# Patient Record
Sex: Female | Born: 1950 | Race: White | Hispanic: No | Marital: Married | Smoking: Never smoker
Health system: Southern US, Community
[De-identification: ages and names within clinical notes are randomized; demographics above are authoritative.]

## PROBLEM LIST (undated history)

## (undated) DIAGNOSIS — K219 Gastro-esophageal reflux disease without esophagitis: Secondary | ICD-10-CM

## (undated) DIAGNOSIS — I341 Nonrheumatic mitral (valve) prolapse: Secondary | ICD-10-CM

## (undated) DIAGNOSIS — Z8619 Personal history of other infectious and parasitic diseases: Secondary | ICD-10-CM

## (undated) DIAGNOSIS — Z87442 Personal history of urinary calculi: Secondary | ICD-10-CM

## (undated) DIAGNOSIS — E669 Obesity, unspecified: Secondary | ICD-10-CM

## (undated) DIAGNOSIS — Z86010 Personal history of colonic polyps: Secondary | ICD-10-CM

## (undated) DIAGNOSIS — G2581 Restless legs syndrome: Secondary | ICD-10-CM

## (undated) DIAGNOSIS — I10 Essential (primary) hypertension: Secondary | ICD-10-CM

## (undated) DIAGNOSIS — D709 Neutropenia, unspecified: Secondary | ICD-10-CM

## (undated) DIAGNOSIS — N6099 Unspecified benign mammary dysplasia of unspecified breast: Secondary | ICD-10-CM

## (undated) DIAGNOSIS — N289 Disorder of kidney and ureter, unspecified: Secondary | ICD-10-CM

## (undated) DIAGNOSIS — D4860 Neoplasm of uncertain behavior of unspecified breast: Secondary | ICD-10-CM

## (undated) DIAGNOSIS — N189 Chronic kidney disease, unspecified: Secondary | ICD-10-CM

## (undated) DIAGNOSIS — O00109 Unspecified tubal pregnancy without intrauterine pregnancy: Secondary | ICD-10-CM

## (undated) DIAGNOSIS — C801 Malignant (primary) neoplasm, unspecified: Secondary | ICD-10-CM

## (undated) DIAGNOSIS — R920 Mammographic microcalcification found on diagnostic imaging of breast: Secondary | ICD-10-CM

## (undated) DIAGNOSIS — M1990 Unspecified osteoarthritis, unspecified site: Secondary | ICD-10-CM

## (undated) DIAGNOSIS — C439 Malignant melanoma of skin, unspecified: Secondary | ICD-10-CM

## (undated) DIAGNOSIS — Z1211 Encounter for screening for malignant neoplasm of colon: Secondary | ICD-10-CM

## (undated) HISTORY — DX: Obesity, unspecified: E66.9

## (undated) HISTORY — DX: Mammographic microcalcification found on diagnostic imaging of breast: R92.0

## (undated) HISTORY — DX: Unspecified benign mammary dysplasia of unspecified breast: N60.99

## (undated) HISTORY — DX: Unspecified tubal pregnancy without intrauterine pregnancy: O00.109

## (undated) HISTORY — DX: Neoplasm of uncertain behavior of unspecified breast: D48.60

## (undated) HISTORY — PX: BREAST SURGERY: SHX581

## (undated) HISTORY — PX: COLONOSCOPY: SHX174

## (undated) HISTORY — DX: Gastro-esophageal reflux disease without esophagitis: K21.9

## (undated) HISTORY — DX: Nonrheumatic mitral (valve) prolapse: I34.1

## (undated) HISTORY — DX: Unspecified osteoarthritis, unspecified site: M19.90

## (undated) HISTORY — DX: Personal history of colonic polyps: Z86.010

## (undated) HISTORY — DX: Malignant (primary) neoplasm, unspecified: C80.1

## (undated) HISTORY — DX: Essential (primary) hypertension: I10

## (undated) HISTORY — DX: Malignant melanoma of skin, unspecified: C43.9

## (undated) HISTORY — DX: Encounter for screening for malignant neoplasm of colon: Z12.11

---

## 1979-06-11 HISTORY — PX: ECTOPIC PREGNANCY SURGERY: SHX613

## 1979-06-11 HISTORY — PX: TUBAL LIGATION: SHX77

## 2003-06-11 DIAGNOSIS — C801 Malignant (primary) neoplasm, unspecified: Secondary | ICD-10-CM

## 2003-06-11 HISTORY — DX: Malignant (primary) neoplasm, unspecified: C80.1

## 2004-06-10 DIAGNOSIS — I341 Nonrheumatic mitral (valve) prolapse: Secondary | ICD-10-CM

## 2004-06-10 HISTORY — DX: Nonrheumatic mitral (valve) prolapse: I34.1

## 2004-08-09 ENCOUNTER — Ambulatory Visit: Payer: Self-pay | Admitting: General Surgery

## 2004-08-30 ENCOUNTER — Ambulatory Visit: Payer: Self-pay | Admitting: Unknown Physician Specialty

## 2005-06-10 HISTORY — PX: SHOULDER SURGERY: SHX246

## 2005-07-12 ENCOUNTER — Ambulatory Visit: Payer: Self-pay | Admitting: Urology

## 2005-12-23 ENCOUNTER — Ambulatory Visit: Payer: Self-pay | Admitting: Physician Assistant

## 2005-12-27 ENCOUNTER — Encounter: Payer: Self-pay | Admitting: Physician Assistant

## 2006-01-08 ENCOUNTER — Encounter: Payer: Self-pay | Admitting: Physician Assistant

## 2006-01-16 ENCOUNTER — Ambulatory Visit: Payer: Self-pay | Admitting: Unknown Physician Specialty

## 2006-01-23 ENCOUNTER — Ambulatory Visit: Payer: Self-pay | Admitting: Orthopaedic Surgery

## 2006-02-08 ENCOUNTER — Encounter: Payer: Self-pay | Admitting: Physician Assistant

## 2006-02-19 ENCOUNTER — Ambulatory Visit: Payer: Self-pay

## 2006-03-10 ENCOUNTER — Encounter: Payer: Self-pay | Admitting: Physician Assistant

## 2006-04-10 ENCOUNTER — Encounter: Payer: Self-pay | Admitting: Physician Assistant

## 2006-05-08 ENCOUNTER — Ambulatory Visit: Payer: Self-pay | Admitting: Orthopaedic Surgery

## 2006-05-10 ENCOUNTER — Encounter: Payer: Self-pay | Admitting: Physician Assistant

## 2006-07-16 ENCOUNTER — Ambulatory Visit: Payer: Self-pay | Admitting: Urology

## 2007-03-26 ENCOUNTER — Ambulatory Visit: Payer: Self-pay | Admitting: Unknown Physician Specialty

## 2007-06-11 DIAGNOSIS — M1990 Unspecified osteoarthritis, unspecified site: Secondary | ICD-10-CM

## 2007-06-11 HISTORY — PX: OTHER SURGICAL HISTORY: SHX169

## 2007-06-11 HISTORY — DX: Unspecified osteoarthritis, unspecified site: M19.90

## 2007-07-15 ENCOUNTER — Ambulatory Visit: Payer: Self-pay | Admitting: Urology

## 2007-09-07 ENCOUNTER — Ambulatory Visit: Payer: Self-pay | Admitting: General Surgery

## 2008-06-10 DIAGNOSIS — I10 Essential (primary) hypertension: Secondary | ICD-10-CM

## 2008-06-10 HISTORY — DX: Essential (primary) hypertension: I10

## 2008-07-13 ENCOUNTER — Ambulatory Visit: Payer: Self-pay | Admitting: Urology

## 2008-07-14 ENCOUNTER — Ambulatory Visit: Payer: Self-pay | Admitting: Urology

## 2008-07-19 ENCOUNTER — Ambulatory Visit: Payer: Self-pay | Admitting: Unknown Physician Specialty

## 2009-07-26 ENCOUNTER — Ambulatory Visit: Payer: Self-pay | Admitting: Urology

## 2009-09-01 ENCOUNTER — Emergency Department: Payer: Self-pay | Admitting: Internal Medicine

## 2010-12-23 ENCOUNTER — Emergency Department: Payer: Self-pay | Admitting: Emergency Medicine

## 2011-06-11 DIAGNOSIS — Z860100 Personal history of colon polyps, unspecified: Secondary | ICD-10-CM

## 2011-06-11 DIAGNOSIS — R920 Mammographic microcalcification found on diagnostic imaging of breast: Secondary | ICD-10-CM

## 2011-06-11 HISTORY — DX: Personal history of colon polyps, unspecified: Z86.0100

## 2011-06-11 HISTORY — DX: Personal history of colonic polyps: Z86.010

## 2011-06-11 HISTORY — PX: BREAST BIOPSY: SHX20

## 2011-06-11 HISTORY — PX: BREAST SURGERY: SHX581

## 2011-06-11 HISTORY — DX: Mammographic microcalcification found on diagnostic imaging of breast: R92.0

## 2011-10-09 DIAGNOSIS — N6099 Unspecified benign mammary dysplasia of unspecified breast: Secondary | ICD-10-CM

## 2011-10-09 HISTORY — DX: Unspecified benign mammary dysplasia of unspecified breast: N60.99

## 2011-10-23 ENCOUNTER — Ambulatory Visit: Payer: Self-pay | Admitting: Unknown Physician Specialty

## 2011-11-07 ENCOUNTER — Ambulatory Visit: Payer: Self-pay | Admitting: General Surgery

## 2011-11-19 ENCOUNTER — Ambulatory Visit: Payer: Self-pay | Admitting: Anesthesiology

## 2011-11-19 LAB — POTASSIUM: Potassium: 3.7 mmol/L (ref 3.5–5.1)

## 2011-11-21 ENCOUNTER — Ambulatory Visit: Payer: Self-pay | Admitting: General Surgery

## 2011-11-22 LAB — PATHOLOGY REPORT

## 2012-04-23 ENCOUNTER — Ambulatory Visit: Payer: Self-pay | Admitting: General Surgery

## 2012-06-10 DIAGNOSIS — D4860 Neoplasm of uncertain behavior of unspecified breast: Secondary | ICD-10-CM

## 2012-06-10 DIAGNOSIS — E669 Obesity, unspecified: Secondary | ICD-10-CM

## 2012-06-10 DIAGNOSIS — Z1211 Encounter for screening for malignant neoplasm of colon: Secondary | ICD-10-CM

## 2012-06-10 HISTORY — DX: Neoplasm of uncertain behavior of unspecified breast: D48.60

## 2012-06-10 HISTORY — DX: Obesity, unspecified: E66.9

## 2012-06-10 HISTORY — PX: COLONOSCOPY W/ POLYPECTOMY: SHX1380

## 2012-06-10 HISTORY — DX: Encounter for screening for malignant neoplasm of colon: Z12.11

## 2012-08-31 ENCOUNTER — Encounter: Payer: Self-pay | Admitting: *Deleted

## 2012-08-31 DIAGNOSIS — C801 Malignant (primary) neoplasm, unspecified: Secondary | ICD-10-CM | POA: Insufficient documentation

## 2012-08-31 DIAGNOSIS — R920 Mammographic microcalcification found on diagnostic imaging of breast: Secondary | ICD-10-CM | POA: Insufficient documentation

## 2012-08-31 DIAGNOSIS — N6099 Unspecified benign mammary dysplasia of unspecified breast: Secondary | ICD-10-CM | POA: Insufficient documentation

## 2012-08-31 DIAGNOSIS — D4860 Neoplasm of uncertain behavior of unspecified breast: Secondary | ICD-10-CM | POA: Insufficient documentation

## 2012-09-03 ENCOUNTER — Encounter: Payer: Self-pay | Admitting: General Surgery

## 2012-09-28 ENCOUNTER — Telehealth: Payer: Self-pay | Admitting: *Deleted

## 2012-09-28 ENCOUNTER — Encounter: Payer: Self-pay | Admitting: *Deleted

## 2012-09-28 NOTE — Telephone Encounter (Signed)
Patient called to reschedule colonoscopy that was scheduled for 09-29-12. She reports she has a fever and a cough. Colonoscopy has been rescheduled to 11-18-12 at [facility]. This patient also reports she is now taking diclofenac 75 mg one by mouth BID. This has been added to medication list.

## 2012-10-22 ENCOUNTER — Encounter: Payer: Self-pay | Admitting: General Surgery

## 2012-10-22 ENCOUNTER — Ambulatory Visit: Payer: Self-pay | Admitting: General Surgery

## 2012-11-03 ENCOUNTER — Ambulatory Visit: Payer: 59 | Admitting: General Surgery

## 2012-11-06 ENCOUNTER — Telehealth: Payer: Self-pay | Admitting: *Deleted

## 2012-11-06 NOTE — Telephone Encounter (Signed)
Left message for patient to call the office on home and cell numbers.  We need to verify no medication changes since last office visit. She will need to be reminded to pre-register if she has not done so already. Patient is scheduled for a colonoscopy on 11-18-12 at [facility].

## 2012-11-06 NOTE — Telephone Encounter (Signed)
Patient states she is now on Klor-con. This will be added to medication list. She also reports that she has pre-registered. We will proceed with colonoscopy that is scheduled for 11-18-12 at [facility]. She will call if she has further questions.

## 2012-11-18 ENCOUNTER — Ambulatory Visit: Payer: Self-pay | Admitting: General Surgery

## 2012-11-18 DIAGNOSIS — D128 Benign neoplasm of rectum: Secondary | ICD-10-CM

## 2012-11-18 DIAGNOSIS — D129 Benign neoplasm of anus and anal canal: Secondary | ICD-10-CM

## 2012-11-19 ENCOUNTER — Encounter: Payer: Self-pay | Admitting: General Surgery

## 2012-11-20 LAB — PATHOLOGY REPORT

## 2012-11-23 ENCOUNTER — Encounter: Payer: Self-pay | Admitting: General Surgery

## 2012-11-24 ENCOUNTER — Telehealth: Payer: Self-pay | Admitting: *Deleted

## 2012-11-24 NOTE — Telephone Encounter (Signed)
Notified patient as instructed, patient pleased. Discussed follow-up appointments, for 8 year recall, patient agrees

## 2012-11-24 NOTE — Telephone Encounter (Signed)
Message copied by Currie Paris on Tue Nov 24, 2012 10:38 AM ------      Message from: Earline Mayotte      Created: Tue Nov 24, 2012  9:32 AM       Please notify the patient that the polyp removed was entirely benign. Repeat exam in 8 years unless she is having problems.  ------

## 2012-12-15 ENCOUNTER — Ambulatory Visit (INDEPENDENT_AMBULATORY_CARE_PROVIDER_SITE_OTHER): Payer: 59 | Admitting: General Surgery

## 2012-12-15 ENCOUNTER — Encounter: Payer: Self-pay | Admitting: General Surgery

## 2012-12-15 VITALS — BP 134/76 | HR 76 | Resp 14 | Ht 64.0 in | Wt 188.0 lb

## 2012-12-15 DIAGNOSIS — D4860 Neoplasm of uncertain behavior of unspecified breast: Secondary | ICD-10-CM

## 2012-12-15 NOTE — Progress Notes (Signed)
Patient ID: Melissa Rhodes, female   DOB: 08/13/1950, 62 y.o.   MRN: [id number]  Chief Complaint  Patient presents with  . Follow-up    mammogram    HPI Melissa Rhodes is a 62 y.o. female here today for an follow up mammogram done on 10/22/12 cat 1. Patient does peform self breast checks and gets regular mammograms.  HPI  Past Medical History  Diagnosis Date  . Arthritis 2009  . Hypertension 2010  . GERD (gastroesophageal reflux disease)   . Personal history of colonic polyps 2013  . Cancer 2005    colon  . 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 Mammographic microcalcification 2013  . Special screening for malignant neoplasms, colon 2014  . Obesity, unspecified 2014  . Mitral valve prolapse 2006  . 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 Tubal pregnancy     Past Surgical History  Procedure Laterality Date  . Shoulder surgery Right 2007  . Colonoscopy  4098,1191  . Colonoscopy w/ polypectomy  2014  . Ectopic pregnancy surgery  1981  . Tubal ligation  1981  . Breast surgery Left 2013    re excision of leftbreast showed small area of residual ADH, but she was not upsatged to DCIS or invasive cancer. The margins were clear  . Kidney stones  2009    Family History  Problem Relation Age of Onset  . Cancer Father 61    lung    Social History History  Substance Use Topics  . Smoking status: Never Smoker   . Smokeless tobacco: Never Used  . Alcohol Use: No    No Known Allergies  Current Outpatient Prescriptions  Medication Sig Dispense Refill  . diclofenac (VOLTAREN) 75 MG EC tablet Take 75 mg by mouth 2 (two) times daily.      . hydrochlorothiazide (HYDRODIURIL) 25 MG tablet Take 25 mg by mouth daily.      . potassium chloride (K-DUR,KLOR-CON) 10 MEQ tablet Take 10 mEq by mouth daily.      Marland Kitchen rOPINIRole (REQUIP) 1 MG tablet Take 1 mg by mouth daily.      . clonazePAM (KLONOPIN) 0.5 MG tablet Take 0.5 mg by mouth daily.        No current facility-administered medications for this visit.    Review of Systems Review of Systems  Constitutional: Negative.   Respiratory: Negative.   Cardiovascular: Negative.     Blood pressure 134/76, pulse 76, resp. rate 14, height 5\' 4"  (1.626 m), weight 188 lb (85.276 kg).  Physical Exam Physical Exam  Constitutional: She is oriented to person, place, and time. She appears well-developed.  Cardiovascular: Normal rate, regular rhythm and normal heart sounds.   Pulmonary/Chest: Breath sounds normal. Right breast exhibits no inverted nipple, no mass, no nipple discharge, no skin change and no tenderness. Left breast exhibits no inverted nipple, no mass, no nipple discharge, no skin change and no tenderness.  Left breast well healed incision at 12o'clock  Lymphadenopathy:    She has no cervical adenopathy.    She has no axillary adenopathy.  Neurological: She is alert and oriented to person, place, and time.  Skin: Skin is warm.    Data Reviewed Polyps removed in June 2014 from the sigmoid colon was hyperplastic. 2009 exam showed no polyps. 2006 exam did show an adenomatous lesion. An 8 year followup have been recommended but the patient reports she is exceptionally anxious and requests a  5 year followup.  Bilateral mammograms in Oct 22, 2012 were reviewed. BI-RAD-1.   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  Past history colonic polyps.     Plan    Arrangements were made for a followup examination in one year with bilateral mammograms at that time. A follow up colonoscopy will be scheduled for 2019 per patient request.        Earline Mayotte 12/15/2012, 8:33 PM

## 2012-12-15 NOTE — Patient Instructions (Signed)
Patient to return in one year. 

## 2012-12-16 ENCOUNTER — Other Ambulatory Visit: Payer: Self-pay | Admitting: *Deleted

## 2012-12-16 NOTE — Progress Notes (Signed)
Please change date for her follow up colonoscopy to 2019. Thank you  This has been changed in recalls accordingly per Dr. Lemar Livings.

## 2013-06-22 ENCOUNTER — Ambulatory Visit: Payer: Self-pay | Admitting: Urology

## 2013-12-15 ENCOUNTER — Encounter: Payer: Self-pay | Admitting: General Surgery

## 2013-12-20 ENCOUNTER — Encounter: Payer: Self-pay | Admitting: General Surgery

## 2013-12-20 ENCOUNTER — Ambulatory Visit (INDEPENDENT_AMBULATORY_CARE_PROVIDER_SITE_OTHER): Payer: 59 | Admitting: General Surgery

## 2013-12-20 VITALS — BP 134/76 | HR 72 | Resp 12 | Ht 64.0 in | Wt 189.0 lb

## 2013-12-20 DIAGNOSIS — D4860 Neoplasm of uncertain behavior of unspecified breast: Secondary | ICD-10-CM

## 2013-12-20 NOTE — Progress Notes (Signed)
Patient ID: Melissa Rhodes, female   DOB: [birth year], 63 y.o.   MRN: [id number]  Chief Complaint  Patient presents with  . Follow-up    mammogram    HPI Melissa Rhodes is a 63 y.o. female who presents for a breast evaluation. The most recent mammogram was done on 12/15/13 at [facility] .  Patient does perform regular self breast checks and gets regular mammograms done.    HPI  Past Medical History  Diagnosis Date  . Arthritis 2009  . Hypertension 2010  . GERD (gastroesophageal reflux disease)   . Personal history of colonic polyps 2013  . Cancer 2005    colon  . 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 Mammographic microcalcification 2013  . Special screening for malignant neoplasms, colon 2014  . Obesity, unspecified 2014  . Mitral valve prolapse 2006  . 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 Tubal pregnancy     Past Surgical History  Procedure Laterality Date  . Shoulder surgery Right 2007  . Colonoscopy  [id number]  . Colonoscopy w/ polypectomy  2014  . Ectopic pregnancy surgery  1981  . Tubal ligation  1981  . Kidney stones  2009  . Breast surgery Left 2013    re excision of left breast showed small area of residual ADH, but she was not upsatged to DCIS or invasive cancer. The margins were clear.  Patient declined tamoxifen therapy    Family History  Problem Relation Age of Onset  . Cancer Father 22    lung    Social History History  Substance Use Topics  . Smoking status: Never Smoker   . Smokeless tobacco: Never Used  . Alcohol Use: No    No Known Allergies  Current Outpatient Prescriptions  Medication Sig Dispense Refill  . clonazePAM (KLONOPIN) 0.5 MG tablet Take 0.5 mg by mouth daily.      . diclofenac (VOLTAREN) 75 MG EC tablet Take 75 mg by mouth 2 (two) times daily.      . hydrochlorothiazide (HYDRODIURIL) 25 MG tablet Take 25 mg by mouth daily.      . potassium chloride (K-DUR,KLOR-CON) 10 MEQ tablet Take 10 mEq by  mouth daily.      Marland Kitchen rOPINIRole (REQUIP) 1 MG tablet Take 1 mg by mouth daily.       No current facility-administered medications for this visit.    Review of Systems Review of Systems  Constitutional: Negative.   Respiratory: Negative.   Cardiovascular: Negative.     Blood pressure 134/76, pulse 72, resp. rate 12, height 5\' 4"  (1.626 m), weight 189 lb (85.73 kg).  Physical Exam Physical Exam  Constitutional: She is oriented to person, place, and time. She appears well-developed and well-nourished.  Eyes: Conjunctivae are normal. No scleral icterus.  Neck: Neck supple.  Cardiovascular: Normal rate, regular rhythm and normal heart sounds.   Pulmonary/Chest: Effort normal and breath sounds normal. Right breast exhibits no inverted nipple, no mass, no nipple discharge, no skin change and no tenderness. Left breast exhibits no inverted nipple, no mass, no nipple discharge, no skin change and no tenderness.  Well healed incison 10 -2 o'clock left breast.   Abdominal: Soft. Bowel sounds are normal. There is no tenderness.  Lymphadenopathy:    She has no cervical adenopathy.    She has no axillary adenopathy.  Neurological: She is alert and oriented to person, place, and time.  Skin: Skin is warm  and dry.    Data Reviewed Bilateral mammograms dated 12/15/2013 were reviewed. Notable change. BI-RAD-2. These films were independently reviewed.  Assessment    Stable breast exam.  Previous identification of ADH. 1.5% absolute benefit with the use of antiestrogen therapy declined by the patient    Plan    Will plan for a follow up exam and bilateral mammograms in one year.      PCP: Sarina Ser, Jeanett Schlein 12/21/2013, 3:45 PM

## 2013-12-20 NOTE — Patient Instructions (Signed)
Patient to return on one year bilateral diagnotic mammogram.

## 2013-12-21 ENCOUNTER — Encounter: Payer: Self-pay | Admitting: General Surgery

## 2013-12-22 ENCOUNTER — Ambulatory Visit: Payer: 59 | Admitting: General Surgery

## 2014-04-11 ENCOUNTER — Encounter: Payer: Self-pay | Admitting: General Surgery

## 2014-07-27 DIAGNOSIS — G2581 Restless legs syndrome: Secondary | ICD-10-CM | POA: Insufficient documentation

## 2014-10-02 NOTE — Op Note (Signed)
PATIENT NAME:  Melissa Rhodes, OBYRNE MR#:  [id number] DATE OF BIRTH:  12/04/1950  DATE OF PROCEDURE:  11/21/2011  PREOPERATIVE DIAGNOSIS: Atypical ductal hyperplasia of the left breast.   POSTOPERATIVE DIAGNOSIS: Atypical ductal hyperplasia of the left breast.  OPERATIVE PROCEDURE: Wide local excision with ultrasound guidance.   SURGEON: Robert Bellow, MD  ANESTHESIA: General by mask under Dr. Marcello Moores, 0.5% Xylocaine with 0.25% Marcaine, plain, 40 mL local infiltration.   ESTIMATED BLOOD LOSS: Minimal.   CLINICAL NOTE: This 64 year old woman had a stereotactic biopsy for microcalcifications showing evidence of atypical ductal hyperplasia. She was encouraged to have re-excision to confirm that no additional pathology was evident.   OPERATIVE NOTE: With the patient under adequate general anesthesia, the area was prepped with ChloraPrep and draped. The above-mentioned local anesthetic was used for postoperative analgesia. Ultrasound was used to confirm the previous biopsy cavity in the 12:00 position of the left breast about 2 cm from the nipple. A skin incision was made transversely orientated and carried down through the skin and subcutaneous tissue. At the junction of the adipose layer and the breast parenchyma a 4 x 6 x 4 cm block of tissue was removed and orientated for the pathologist. Specimen radiograph was obtained. The deep tissue was approximated with interrupted 2-0 Vicryl figure-of-eight sutures. The adipose layer was approximated in a similar fashion. The skin was closed with a running 4-0 Vicryl subcuticular suture. Benzoin, Steri-Strips, Telfa, and Tegaderm dressing was then applied.        Patient tolerated the procedure well was taken to the recovery room in stable condition.    ____________________________ Robert Bellow, MD jwb:cms D: 11/21/2011 20:31:49 ET T: 11/22/2011 08:42:35 ET JOB#: [id number]  cc: Robert Bellow, MD, <Dictator> John B. Sarina Ser,  MD Eusebio Me, MD [place] Amedeo Kinsman MD ELECTRONICALLY SIGNED 11/23/2011 1:53

## 2014-12-29 ENCOUNTER — Encounter: Payer: Self-pay | Admitting: General Surgery

## 2015-01-02 ENCOUNTER — Ambulatory Visit (INDEPENDENT_AMBULATORY_CARE_PROVIDER_SITE_OTHER): Payer: 59 | Admitting: General Surgery

## 2015-01-02 ENCOUNTER — Encounter: Payer: Self-pay | Admitting: General Surgery

## 2015-01-02 VITALS — BP 130/78 | HR 76 | Resp 12 | Ht 64.0 in | Wt 186.0 lb

## 2015-01-02 DIAGNOSIS — N6099 Unspecified benign mammary dysplasia of unspecified breast: Secondary | ICD-10-CM

## 2015-01-02 DIAGNOSIS — N62 Hypertrophy of breast: Secondary | ICD-10-CM | POA: Diagnosis not present

## 2015-01-02 NOTE — Patient Instructions (Signed)
Bilateral Screening Mammogram one year

## 2015-01-02 NOTE — Progress Notes (Signed)
Patient ID: Melissa Rhodes, female   DOB: December 31, 1950, 64 y.o.   MRN: [id number]  Chief Complaint  Patient presents with  . Other    mammogram    HPI Melissa Rhodes is a 64 y.o. female who presents for a breast evaluation. The most recent mammogram was done on 12/27/14, Cat 1. Patient does perform regular self breast checks and gets regular mammograms done.     HPI  Past Medical History  Diagnosis Date  . Arthritis 2009  . Hypertension 2010  . GERD (gastroesophageal reflux disease)   . Personal history of colonic polyps 2013  . Cancer 2005    colon  . 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 Mammographic microcalcification 2013  . Special screening for malignant neoplasms, colon 2014  . Obesity, unspecified 2014  . Mitral valve prolapse 2006  . 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 Tubal pregnancy     Past Surgical History  Procedure Laterality Date  . Shoulder surgery Right 2007  . Colonoscopy  [id number]  . Colonoscopy w/ polypectomy  2014  . Ectopic pregnancy surgery  1981  . Tubal ligation  1981  . Kidney stones  2009  . Breast surgery Left 2013    re excision of left breast showed small area of residual ADH, but she was not upsatged to DCIS or invasive cancer. The margins were clear.  Patient declined tamoxifen therapy    Family History  Problem Relation Age of Onset  . Cancer Father 56    lung    Social History History  Substance Use Topics  . Smoking status: Never Smoker   . Smokeless tobacco: Never Used  . Alcohol Use: No    No Known Allergies  Current Outpatient Prescriptions  Medication Sig Dispense Refill  . clonazePAM (KLONOPIN) 0.5 MG tablet Take 0.5 mg by mouth daily.    . diclofenac (VOLTAREN) 75 MG EC tablet Take 75 mg by mouth 2 (two) times daily.    . hydrochlorothiazide (HYDRODIURIL) 25 MG tablet Take 25 mg by mouth daily.     No current facility-administered medications for this visit.    Review  of Systems Review of Systems  Constitutional: Negative.   Respiratory: Negative.   Cardiovascular: Negative.     Blood pressure 130/78, pulse 76, resp. rate 12, height 5\' 4"  (1.626 m), weight 186 lb (84.369 kg).  Physical Exam Physical Exam  Constitutional: She is oriented to person, place, and time. She appears well-developed and well-nourished.  Eyes: Conjunctivae are normal. No scleral icterus.  Neck: Neck supple.  Cardiovascular: Normal rate, regular rhythm and normal heart sounds.   Pulmonary/Chest: Effort normal and breath sounds normal. Right breast exhibits no inverted nipple, no mass, no nipple discharge, no skin change and no tenderness. Left breast exhibits no inverted nipple, no mass, no nipple discharge, no skin change and no tenderness.    Lymphadenopathy:    She has no cervical adenopathy.  Neurological: She is alert and oriented to person, place, and time.  Skin: Skin is warm and dry.  Psychiatric: She has a normal mood and affect.    Data Reviewed Bilateral diagnostic mammograms dated 12/27/2014 were reviewed and compared to previous studies. No interval change. BI-RADS-1.  Assessment    Stable, benign breast exam.    Plan    We will plan for follow-up examination in one year. Once again reviewed role of chemo prophylaxis. Declined.    PCP:  Sarina Ser, Jeanett Schlein 01/03/2015, 5:12 PM

## 2015-01-03 DIAGNOSIS — N6099 Unspecified benign mammary dysplasia of unspecified breast: Secondary | ICD-10-CM | POA: Insufficient documentation

## 2015-07-21 DIAGNOSIS — M5442 Lumbago with sciatica, left side: Secondary | ICD-10-CM | POA: Diagnosis not present

## 2015-07-21 DIAGNOSIS — M7062 Trochanteric bursitis, left hip: Secondary | ICD-10-CM | POA: Diagnosis not present

## 2015-08-29 DIAGNOSIS — H16213 Exposure keratoconjunctivitis, bilateral: Secondary | ICD-10-CM | POA: Diagnosis not present

## 2015-08-29 DIAGNOSIS — H52223 Regular astigmatism, bilateral: Secondary | ICD-10-CM | POA: Diagnosis not present

## 2015-08-29 DIAGNOSIS — H00025 Hordeolum internum left lower eyelid: Secondary | ICD-10-CM | POA: Diagnosis not present

## 2015-08-29 DIAGNOSIS — H00022 Hordeolum internum right lower eyelid: Secondary | ICD-10-CM | POA: Diagnosis not present

## 2015-08-29 DIAGNOSIS — H04123 Dry eye syndrome of bilateral lacrimal glands: Secondary | ICD-10-CM | POA: Diagnosis not present

## 2015-08-29 DIAGNOSIS — H524 Presbyopia: Secondary | ICD-10-CM | POA: Diagnosis not present

## 2015-08-29 DIAGNOSIS — H5202 Hypermetropia, left eye: Secondary | ICD-10-CM | POA: Diagnosis not present

## 2015-09-01 DIAGNOSIS — I10 Essential (primary) hypertension: Secondary | ICD-10-CM | POA: Diagnosis not present

## 2015-09-01 DIAGNOSIS — E7800 Pure hypercholesterolemia, unspecified: Secondary | ICD-10-CM | POA: Diagnosis not present

## 2015-09-08 DIAGNOSIS — Z87442 Personal history of urinary calculi: Secondary | ICD-10-CM | POA: Diagnosis not present

## 2015-09-08 DIAGNOSIS — I10 Essential (primary) hypertension: Secondary | ICD-10-CM | POA: Diagnosis not present

## 2015-09-08 DIAGNOSIS — I341 Nonrheumatic mitral (valve) prolapse: Secondary | ICD-10-CM | POA: Diagnosis not present

## 2015-09-08 DIAGNOSIS — D709 Neutropenia, unspecified: Secondary | ICD-10-CM | POA: Diagnosis not present

## 2015-09-08 DIAGNOSIS — Z23 Encounter for immunization: Secondary | ICD-10-CM | POA: Diagnosis not present

## 2015-09-08 DIAGNOSIS — E7800 Pure hypercholesterolemia, unspecified: Secondary | ICD-10-CM | POA: Diagnosis not present

## 2015-09-08 DIAGNOSIS — Z0001 Encounter for general adult medical examination with abnormal findings: Secondary | ICD-10-CM | POA: Diagnosis not present

## 2015-10-04 ENCOUNTER — Other Ambulatory Visit: Payer: Self-pay

## 2015-10-04 DIAGNOSIS — N6089 Other benign mammary dysplasias of unspecified breast: Secondary | ICD-10-CM

## 2015-10-05 ENCOUNTER — Other Ambulatory Visit: Payer: Self-pay

## 2015-10-05 DIAGNOSIS — Z1231 Encounter for screening mammogram for malignant neoplasm of breast: Secondary | ICD-10-CM

## 2015-10-06 ENCOUNTER — Other Ambulatory Visit: Payer: Self-pay | Admitting: *Deleted

## 2015-10-06 ENCOUNTER — Inpatient Hospital Stay
Admission: RE | Admit: 2015-10-06 | Discharge: 2015-10-06 | Disposition: A | Discharge status: Home / Self Care | Payer: Self-pay | Source: Ambulatory Visit | Attending: *Deleted | Admitting: *Deleted

## 2015-10-06 DIAGNOSIS — Z9289 Personal history of other medical treatment: Secondary | ICD-10-CM

## 2015-12-28 ENCOUNTER — Ambulatory Visit: Payer: Self-pay

## 2016-01-02 ENCOUNTER — Ambulatory Visit: Payer: 59 | Admitting: General Surgery

## 2016-01-03 ENCOUNTER — Ambulatory Visit: Payer: 59 | Admitting: General Surgery

## 2016-02-06 ENCOUNTER — Ambulatory Visit
Admission: RE | Admit: 2016-02-06 | Discharge: 2016-02-06 | Disposition: A | Discharge status: Home / Self Care | Payer: PPO | Source: Ambulatory Visit | Attending: General Surgery | Admitting: General Surgery

## 2016-02-06 ENCOUNTER — Encounter: Payer: Self-pay | Admitting: *Deleted

## 2016-02-06 ENCOUNTER — Other Ambulatory Visit: Payer: Self-pay | Admitting: General Surgery

## 2016-02-06 DIAGNOSIS — Z1231 Encounter for screening mammogram for malignant neoplasm of breast: Secondary | ICD-10-CM | POA: Diagnosis not present

## 2016-02-13 ENCOUNTER — Encounter: Payer: Self-pay | Admitting: General Surgery

## 2016-02-13 ENCOUNTER — Ambulatory Visit (INDEPENDENT_AMBULATORY_CARE_PROVIDER_SITE_OTHER): Payer: PPO | Admitting: General Surgery

## 2016-02-13 VITALS — BP 130/72 | HR 60 | Resp 12 | Ht 64.0 in | Wt 187.0 lb

## 2016-02-13 DIAGNOSIS — K625 Hemorrhage of anus and rectum: Secondary | ICD-10-CM | POA: Diagnosis not present

## 2016-02-13 DIAGNOSIS — K602 Anal fissure, unspecified: Secondary | ICD-10-CM | POA: Diagnosis not present

## 2016-02-13 DIAGNOSIS — N62 Hypertrophy of breast: Secondary | ICD-10-CM

## 2016-02-13 DIAGNOSIS — N6099 Unspecified benign mammary dysplasia of unspecified breast: Secondary | ICD-10-CM

## 2016-02-13 MED ORDER — HYDROCORTISONE 1 % EX CREA: TOPICAL_CREAM | CUTANEOUS | 1 refills | Status: AC

## 2016-02-13 NOTE — Patient Instructions (Addendum)
Continue self breast exams. Call office for any new breast issues or concerns. Use cream as prescribed. Follow up in one month.

## 2016-02-13 NOTE — Progress Notes (Signed)
Patient ID: Melissa Rhodes, female   DOB: 09-30-1950, 65 y.o.   MRN: [id number]  Chief Complaint  Patient presents with  . Follow-up    mammogram    HPI Melissa Rhodes is a 65 y.o. female who presents for a breast evaluation. The most recent mammogram was done on 02/06/16.  Patient does perform regular self breast checks and gets regular mammograms done.  Patient states no new breast issues.    HPI  Past Medical History:  Diagnosis Date  . Arthritis 2009  . 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 Cancer [facility]) 2005   colon  . GERD (gastroesophageal reflux disease)   . Hypertension 2010  . Mammographic microcalcification 2013  . Mitral valve prolapse 2006  . 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 Obesity, unspecified 2014  . Personal history of colonic polyps 2013  . Special screening for malignant neoplasms, colon 2014  . Tubal pregnancy     Past Surgical History:  Procedure Laterality Date  . BREAST BIOPSY Left 2013  . BREAST SURGERY Left 2013   re excision of left breast showed small area of residual ADH, but she was not upsatged to DCIS or invasive cancer. The margins were clear.  Patient declined tamoxifen therapy  . COLONOSCOPY  [id number]  . COLONOSCOPY W/ POLYPECTOMY  2014  . ECTOPIC PREGNANCY SURGERY  1981  . kidney stones  2009  . SHOULDER SURGERY Right 2007  . TUBAL LIGATION  1981    Family History  Problem Relation Age of Onset  . Cancer Father 55    lung  . Breast cancer Neg Hx     Social History Social History  Substance Use Topics  . Smoking status: Never Smoker  . Smokeless tobacco: Never Used  . Alcohol use No    No Known Allergies  Current Outpatient Prescriptions  Medication Sig Dispense Refill  . clonazePAM (KLONOPIN) 0.5 MG tablet take 1 tablet by mouth twice a day    . diclofenac (VOLTAREN) 75 MG EC tablet Take 75 mg by mouth 2 (two) times daily.    . fluocinonide cream (LIDEX) 0.05 % Apply  topically.    . hydrochlorothiazide (HYDRODIURIL) 25 MG tablet Take 25 mg by mouth daily.    Marland Kitchen rOPINIRole (REQUIP) 1 MG tablet Take by mouth.    . hydrocortisone cream 1 % Apply to affected area 3 times daily 30 g 1   No current facility-administered medications for this visit.     Review of Systems Review of Systems  Constitutional: Negative.   Respiratory: Negative.   Cardiovascular: Negative.     Blood pressure 130/72, pulse 60, resp. rate 12, height 5\' 4"  (1.626 m), weight 187 lb (84.8 kg).  Physical Exam Physical Exam  Constitutional: She is oriented to person, place, and time. She appears well-developed and well-nourished.  Eyes: Conjunctivae are normal. No scleral icterus.  Neck: Neck supple. No thyromegaly present.  Cardiovascular: Normal rate, regular rhythm and normal heart sounds.   Pulmonary/Chest: Effort normal and breath sounds normal. Right breast exhibits no inverted nipple, no mass, no nipple discharge, no skin change and no tenderness. Left breast exhibits inverted nipple (flat). Left breast exhibits no mass, no nipple discharge, no skin change and no tenderness.    Genitourinary: Rectal exam shows fissure (posterior With sentinel pile) and mass. Rectal exam shows no tenderness, anal tone normal and guaiac negative stool.  Lymphadenopathy:    She has no cervical  adenopathy.  Neurological: She is alert and oriented to person, place, and time.  Skin: Skin is warm and dry.    Data Reviewed Bilateral screening mammograms dated 02/06/2016 were independently reviewed. No interval change. BI-RADS-1.  2014 colonoscopy: Inflammatory polyp of the rectum.  2009 colonoscopy normal.  2006 colonoscopy: Adenomatous polyp.    Assessment    Benign breast exam, minimal volume height loss of the left nipple.  Anal fissure with recent rectal bleeding.    Plan    She will make use of Anusol HC cream 3 times a day. Application technique reviewed. Reassess in one month  for resolution.     Follow up in one month for anal fissure. Robert Bellow 02/13/2016, 9:50 PM

## 2016-02-20 DIAGNOSIS — D709 Neutropenia, unspecified: Secondary | ICD-10-CM | POA: Diagnosis not present

## 2016-02-20 DIAGNOSIS — E7800 Pure hypercholesterolemia, unspecified: Secondary | ICD-10-CM | POA: Diagnosis not present

## 2016-02-20 DIAGNOSIS — I10 Essential (primary) hypertension: Secondary | ICD-10-CM | POA: Diagnosis not present

## 2016-02-27 DIAGNOSIS — E7800 Pure hypercholesterolemia, unspecified: Secondary | ICD-10-CM | POA: Diagnosis not present

## 2016-02-27 DIAGNOSIS — G2581 Restless legs syndrome: Secondary | ICD-10-CM | POA: Diagnosis not present

## 2016-02-27 DIAGNOSIS — D709 Neutropenia, unspecified: Secondary | ICD-10-CM | POA: Diagnosis not present

## 2016-02-27 DIAGNOSIS — I10 Essential (primary) hypertension: Secondary | ICD-10-CM | POA: Diagnosis not present

## 2016-02-27 DIAGNOSIS — M2669 Other specified disorders of temporomandibular joint: Secondary | ICD-10-CM | POA: Diagnosis not present

## 2016-03-13 ENCOUNTER — Ambulatory Visit: Payer: PPO | Admitting: General Surgery

## 2016-03-27 ENCOUNTER — Ambulatory Visit (INDEPENDENT_AMBULATORY_CARE_PROVIDER_SITE_OTHER): Payer: PPO | Admitting: General Surgery

## 2016-03-27 ENCOUNTER — Encounter: Payer: Self-pay | Admitting: General Surgery

## 2016-03-27 VITALS — BP 162/82 | HR 74 | Resp 12 | Ht 64.0 in | Wt 183.0 lb

## 2016-03-27 DIAGNOSIS — K602 Anal fissure, unspecified: Secondary | ICD-10-CM

## 2016-03-27 NOTE — Patient Instructions (Signed)
May use cream daily until end of the month. The patient is aware to call back for any questions or concerns.

## 2016-03-27 NOTE — Progress Notes (Signed)
Patient ID: Melissa Rhodes, female   DOB: 09/10/1950, 65 y.o.   MRN: [id number]  Chief Complaint  Patient presents with  . Routine Post Op    anal fissure    HPI Melissa Rhodes is a 65 y.o. female here today for her anal fissure. Patient states she is doing well.  HPI  Past Medical History:  Diagnosis Date  . Arthritis 2009  . 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 Cancer [facility]) 2005   colon  . GERD (gastroesophageal reflux disease)   . Hypertension 2010  . Mammographic microcalcification 2013  . Mitral valve prolapse 2006  . 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 Obesity, unspecified 2014  . Personal history of colonic polyps 2013  . Special screening for malignant neoplasms, colon 2014  . Tubal pregnancy     Past Surgical History:  Procedure Laterality Date  . BREAST BIOPSY Left 2013  . BREAST SURGERY Left 2013   re excision of left breast showed small area of residual ADH, but she was not upsatged to DCIS or invasive cancer. The margins were clear.  Patient declined tamoxifen therapy  . COLONOSCOPY  [id number]  . COLONOSCOPY W/ POLYPECTOMY  2014  . ECTOPIC PREGNANCY SURGERY  1981  . kidney stones  2009  . SHOULDER SURGERY Right 2007  . TUBAL LIGATION  1981    Family History  Problem Relation Age of Onset  . Cancer Father 35    lung  . Breast cancer Neg Hx     Social History Social History  Substance Use Topics  . Smoking status: Never Smoker  . Smokeless tobacco: Never Used  . Alcohol use No    No Known Allergies  Current Outpatient Prescriptions  Medication Sig Dispense Refill  . clonazePAM (KLONOPIN) 0.5 MG tablet take 1 tablet by mouth twice a day    . diclofenac (VOLTAREN) 75 MG EC tablet Take 75 mg by mouth 2 (two) times daily.    . fluocinonide cream (LIDEX) 0.05 % Apply topically.    . hydrochlorothiazide (HYDRODIURIL) 25 MG tablet Take 25 mg by mouth daily.    . hydrocortisone cream 1 % Apply to affected  area 3 times daily 30 g 1  . rOPINIRole (REQUIP) 1 MG tablet Take by mouth.     No current facility-administered medications for this visit.     Review of Systems Review of Systems  Constitutional: Negative.   Respiratory: Negative.   Cardiovascular: Negative.     Blood pressure (!) 162/82, pulse 74, resp. rate 12, height 5\' 4"  (1.626 m), weight 183 lb (83 kg).  Physical Exam Physical Exam  Genitourinary:          Assessment    Anal fissure, improving on conservative therapy.     Plan          May use cream daily until end of the month. The patient is aware to call back for any questions or concerns. Follow up in one year.  This information has been scribed by Gaspar Cola CMA.  Melissa Rhodes 03/31/2016, 7:53 PM

## 2016-04-19 DIAGNOSIS — M1612 Unilateral primary osteoarthritis, left hip: Secondary | ICD-10-CM | POA: Diagnosis not present

## 2016-04-19 DIAGNOSIS — M7062 Trochanteric bursitis, left hip: Secondary | ICD-10-CM | POA: Diagnosis not present

## 2016-04-19 DIAGNOSIS — M25552 Pain in left hip: Secondary | ICD-10-CM | POA: Diagnosis not present

## 2016-05-13 DIAGNOSIS — J029 Acute pharyngitis, unspecified: Secondary | ICD-10-CM | POA: Diagnosis not present

## 2016-05-13 DIAGNOSIS — M6281 Muscle weakness (generalized): Secondary | ICD-10-CM | POA: Diagnosis not present

## 2016-05-13 DIAGNOSIS — M25552 Pain in left hip: Secondary | ICD-10-CM | POA: Diagnosis not present

## 2016-05-17 DIAGNOSIS — M25552 Pain in left hip: Secondary | ICD-10-CM | POA: Diagnosis not present

## 2016-05-20 DIAGNOSIS — M6281 Muscle weakness (generalized): Secondary | ICD-10-CM | POA: Diagnosis not present

## 2016-05-20 DIAGNOSIS — M25552 Pain in left hip: Secondary | ICD-10-CM | POA: Diagnosis not present

## 2016-05-22 DIAGNOSIS — M6281 Muscle weakness (generalized): Secondary | ICD-10-CM | POA: Diagnosis not present

## 2016-05-22 DIAGNOSIS — M25552 Pain in left hip: Secondary | ICD-10-CM | POA: Diagnosis not present

## 2016-05-27 DIAGNOSIS — M25552 Pain in left hip: Secondary | ICD-10-CM | POA: Diagnosis not present

## 2016-05-27 DIAGNOSIS — M6281 Muscle weakness (generalized): Secondary | ICD-10-CM | POA: Diagnosis not present

## 2016-05-29 DIAGNOSIS — M7062 Trochanteric bursitis, left hip: Secondary | ICD-10-CM | POA: Diagnosis not present

## 2016-05-29 DIAGNOSIS — M6281 Muscle weakness (generalized): Secondary | ICD-10-CM | POA: Diagnosis not present

## 2016-05-29 DIAGNOSIS — M25552 Pain in left hip: Secondary | ICD-10-CM | POA: Diagnosis not present

## 2016-06-05 DIAGNOSIS — M7062 Trochanteric bursitis, left hip: Secondary | ICD-10-CM | POA: Diagnosis not present

## 2016-06-05 DIAGNOSIS — M6281 Muscle weakness (generalized): Secondary | ICD-10-CM | POA: Diagnosis not present

## 2016-06-05 DIAGNOSIS — M25552 Pain in left hip: Secondary | ICD-10-CM | POA: Diagnosis not present

## 2016-06-07 DIAGNOSIS — M7062 Trochanteric bursitis, left hip: Secondary | ICD-10-CM | POA: Diagnosis not present

## 2016-06-07 DIAGNOSIS — M6281 Muscle weakness (generalized): Secondary | ICD-10-CM | POA: Diagnosis not present

## 2016-06-07 DIAGNOSIS — M25552 Pain in left hip: Secondary | ICD-10-CM | POA: Diagnosis not present

## 2016-06-12 DIAGNOSIS — M25552 Pain in left hip: Secondary | ICD-10-CM | POA: Diagnosis not present

## 2016-06-12 DIAGNOSIS — M6281 Muscle weakness (generalized): Secondary | ICD-10-CM | POA: Diagnosis not present

## 2016-06-12 DIAGNOSIS — M7062 Trochanteric bursitis, left hip: Secondary | ICD-10-CM | POA: Diagnosis not present

## 2016-06-14 DIAGNOSIS — M7062 Trochanteric bursitis, left hip: Secondary | ICD-10-CM | POA: Diagnosis not present

## 2016-06-14 DIAGNOSIS — M6281 Muscle weakness (generalized): Secondary | ICD-10-CM | POA: Diagnosis not present

## 2016-06-14 DIAGNOSIS — M25552 Pain in left hip: Secondary | ICD-10-CM | POA: Diagnosis not present

## 2016-06-17 DIAGNOSIS — M6281 Muscle weakness (generalized): Secondary | ICD-10-CM | POA: Diagnosis not present

## 2016-06-17 DIAGNOSIS — M7062 Trochanteric bursitis, left hip: Secondary | ICD-10-CM | POA: Diagnosis not present

## 2016-06-17 DIAGNOSIS — M25552 Pain in left hip: Secondary | ICD-10-CM | POA: Diagnosis not present

## 2016-06-19 DIAGNOSIS — M7062 Trochanteric bursitis, left hip: Secondary | ICD-10-CM | POA: Diagnosis not present

## 2016-06-19 DIAGNOSIS — M25552 Pain in left hip: Secondary | ICD-10-CM | POA: Diagnosis not present

## 2016-06-19 DIAGNOSIS — M6281 Muscle weakness (generalized): Secondary | ICD-10-CM | POA: Diagnosis not present

## 2016-06-24 DIAGNOSIS — M25552 Pain in left hip: Secondary | ICD-10-CM | POA: Diagnosis not present

## 2016-06-24 DIAGNOSIS — M7062 Trochanteric bursitis, left hip: Secondary | ICD-10-CM | POA: Diagnosis not present

## 2016-06-24 DIAGNOSIS — M6281 Muscle weakness (generalized): Secondary | ICD-10-CM | POA: Diagnosis not present

## 2016-07-01 DIAGNOSIS — M25552 Pain in left hip: Secondary | ICD-10-CM | POA: Diagnosis not present

## 2016-07-05 DIAGNOSIS — M25552 Pain in left hip: Secondary | ICD-10-CM | POA: Diagnosis not present

## 2016-07-05 DIAGNOSIS — M6281 Muscle weakness (generalized): Secondary | ICD-10-CM | POA: Diagnosis not present

## 2016-07-05 DIAGNOSIS — M7062 Trochanteric bursitis, left hip: Secondary | ICD-10-CM | POA: Diagnosis not present

## 2016-07-08 DIAGNOSIS — M7062 Trochanteric bursitis, left hip: Secondary | ICD-10-CM | POA: Diagnosis not present

## 2016-07-08 DIAGNOSIS — M25552 Pain in left hip: Secondary | ICD-10-CM | POA: Diagnosis not present

## 2016-07-08 DIAGNOSIS — M6281 Muscle weakness (generalized): Secondary | ICD-10-CM | POA: Diagnosis not present

## 2016-07-26 ENCOUNTER — Other Ambulatory Visit: Payer: Self-pay | Admitting: Surgery

## 2016-07-26 DIAGNOSIS — M7062 Trochanteric bursitis, left hip: Secondary | ICD-10-CM

## 2016-07-26 DIAGNOSIS — M1612 Unilateral primary osteoarthritis, left hip: Secondary | ICD-10-CM | POA: Diagnosis not present

## 2016-08-13 ENCOUNTER — Ambulatory Visit
Admission: RE | Admit: 2016-08-13 | Discharge: 2016-08-13 | Disposition: A | Discharge status: Home / Self Care | Payer: PPO | Source: Ambulatory Visit | Attending: Surgery | Admitting: Surgery

## 2016-08-13 DIAGNOSIS — M25852 Other specified joint disorders, left hip: Secondary | ICD-10-CM | POA: Insufficient documentation

## 2016-08-13 DIAGNOSIS — M1612 Unilateral primary osteoarthritis, left hip: Secondary | ICD-10-CM | POA: Insufficient documentation

## 2016-08-13 DIAGNOSIS — M7062 Trochanteric bursitis, left hip: Secondary | ICD-10-CM

## 2016-08-19 DIAGNOSIS — S76012D Strain of muscle, fascia and tendon of left hip, subsequent encounter: Secondary | ICD-10-CM | POA: Diagnosis not present

## 2016-08-19 DIAGNOSIS — M1612 Unilateral primary osteoarthritis, left hip: Secondary | ICD-10-CM | POA: Diagnosis not present

## 2016-08-19 DIAGNOSIS — M7062 Trochanteric bursitis, left hip: Secondary | ICD-10-CM | POA: Diagnosis not present

## 2016-08-28 DIAGNOSIS — M6281 Muscle weakness (generalized): Secondary | ICD-10-CM | POA: Diagnosis not present

## 2016-08-28 DIAGNOSIS — M25552 Pain in left hip: Secondary | ICD-10-CM | POA: Diagnosis not present

## 2016-08-30 DIAGNOSIS — M25552 Pain in left hip: Secondary | ICD-10-CM | POA: Diagnosis not present

## 2016-08-30 DIAGNOSIS — M6281 Muscle weakness (generalized): Secondary | ICD-10-CM | POA: Diagnosis not present

## 2016-09-02 DIAGNOSIS — E7800 Pure hypercholesterolemia, unspecified: Secondary | ICD-10-CM | POA: Diagnosis not present

## 2016-09-02 DIAGNOSIS — I10 Essential (primary) hypertension: Secondary | ICD-10-CM | POA: Diagnosis not present

## 2016-09-02 DIAGNOSIS — M25552 Pain in left hip: Secondary | ICD-10-CM | POA: Diagnosis not present

## 2016-09-04 DIAGNOSIS — M25552 Pain in left hip: Secondary | ICD-10-CM | POA: Diagnosis not present

## 2016-09-09 DIAGNOSIS — E7800 Pure hypercholesterolemia, unspecified: Secondary | ICD-10-CM | POA: Diagnosis not present

## 2016-09-09 DIAGNOSIS — M25552 Pain in left hip: Secondary | ICD-10-CM | POA: Diagnosis not present

## 2016-09-09 DIAGNOSIS — Z0001 Encounter for general adult medical examination with abnormal findings: Secondary | ICD-10-CM | POA: Diagnosis not present

## 2016-09-09 DIAGNOSIS — Z0000 Encounter for general adult medical examination without abnormal findings: Secondary | ICD-10-CM | POA: Diagnosis not present

## 2016-09-09 DIAGNOSIS — G2581 Restless legs syndrome: Secondary | ICD-10-CM | POA: Diagnosis not present

## 2016-09-09 DIAGNOSIS — I10 Essential (primary) hypertension: Secondary | ICD-10-CM | POA: Diagnosis not present

## 2016-09-16 DIAGNOSIS — M25552 Pain in left hip: Secondary | ICD-10-CM | POA: Diagnosis not present

## 2016-09-20 DIAGNOSIS — M7062 Trochanteric bursitis, left hip: Secondary | ICD-10-CM | POA: Diagnosis not present

## 2016-09-20 DIAGNOSIS — M6281 Muscle weakness (generalized): Secondary | ICD-10-CM | POA: Diagnosis not present

## 2016-09-20 DIAGNOSIS — M25552 Pain in left hip: Secondary | ICD-10-CM | POA: Diagnosis not present

## 2016-09-23 DIAGNOSIS — M25552 Pain in left hip: Secondary | ICD-10-CM | POA: Diagnosis not present

## 2016-09-27 DIAGNOSIS — M25552 Pain in left hip: Secondary | ICD-10-CM | POA: Diagnosis not present

## 2016-09-27 DIAGNOSIS — M7062 Trochanteric bursitis, left hip: Secondary | ICD-10-CM | POA: Diagnosis not present

## 2016-09-27 DIAGNOSIS — M6281 Muscle weakness (generalized): Secondary | ICD-10-CM | POA: Diagnosis not present

## 2016-09-30 DIAGNOSIS — M7062 Trochanteric bursitis, left hip: Secondary | ICD-10-CM | POA: Diagnosis not present

## 2016-09-30 DIAGNOSIS — M25552 Pain in left hip: Secondary | ICD-10-CM | POA: Diagnosis not present

## 2016-09-30 DIAGNOSIS — M6281 Muscle weakness (generalized): Secondary | ICD-10-CM | POA: Diagnosis not present

## 2016-10-04 DIAGNOSIS — M25552 Pain in left hip: Secondary | ICD-10-CM | POA: Diagnosis not present

## 2016-10-04 DIAGNOSIS — M6281 Muscle weakness (generalized): Secondary | ICD-10-CM | POA: Diagnosis not present

## 2016-10-07 DIAGNOSIS — M7062 Trochanteric bursitis, left hip: Secondary | ICD-10-CM | POA: Diagnosis not present

## 2016-10-07 DIAGNOSIS — M6281 Muscle weakness (generalized): Secondary | ICD-10-CM | POA: Diagnosis not present

## 2016-10-07 DIAGNOSIS — M25552 Pain in left hip: Secondary | ICD-10-CM | POA: Diagnosis not present

## 2016-10-09 DIAGNOSIS — M7062 Trochanteric bursitis, left hip: Secondary | ICD-10-CM | POA: Diagnosis not present

## 2016-10-09 DIAGNOSIS — M25552 Pain in left hip: Secondary | ICD-10-CM | POA: Diagnosis not present

## 2016-10-09 DIAGNOSIS — M6281 Muscle weakness (generalized): Secondary | ICD-10-CM | POA: Diagnosis not present

## 2016-10-14 DIAGNOSIS — M25552 Pain in left hip: Secondary | ICD-10-CM | POA: Diagnosis not present

## 2016-10-14 DIAGNOSIS — M7062 Trochanteric bursitis, left hip: Secondary | ICD-10-CM | POA: Diagnosis not present

## 2016-10-14 DIAGNOSIS — M6281 Muscle weakness (generalized): Secondary | ICD-10-CM | POA: Diagnosis not present

## 2016-10-18 DIAGNOSIS — M6281 Muscle weakness (generalized): Secondary | ICD-10-CM | POA: Diagnosis not present

## 2016-10-18 DIAGNOSIS — M25552 Pain in left hip: Secondary | ICD-10-CM | POA: Diagnosis not present

## 2016-10-18 DIAGNOSIS — M7062 Trochanteric bursitis, left hip: Secondary | ICD-10-CM | POA: Diagnosis not present

## 2016-10-21 DIAGNOSIS — M25552 Pain in left hip: Secondary | ICD-10-CM | POA: Diagnosis not present

## 2016-10-21 DIAGNOSIS — M6281 Muscle weakness (generalized): Secondary | ICD-10-CM | POA: Diagnosis not present

## 2016-10-21 DIAGNOSIS — M7062 Trochanteric bursitis, left hip: Secondary | ICD-10-CM | POA: Diagnosis not present

## 2016-10-23 DIAGNOSIS — M25552 Pain in left hip: Secondary | ICD-10-CM | POA: Diagnosis not present

## 2016-10-23 DIAGNOSIS — M6281 Muscle weakness (generalized): Secondary | ICD-10-CM | POA: Diagnosis not present

## 2016-10-23 DIAGNOSIS — M7062 Trochanteric bursitis, left hip: Secondary | ICD-10-CM | POA: Diagnosis not present

## 2016-10-28 DIAGNOSIS — M25552 Pain in left hip: Secondary | ICD-10-CM | POA: Diagnosis not present

## 2016-10-28 DIAGNOSIS — M7062 Trochanteric bursitis, left hip: Secondary | ICD-10-CM | POA: Diagnosis not present

## 2016-10-28 DIAGNOSIS — M6281 Muscle weakness (generalized): Secondary | ICD-10-CM | POA: Diagnosis not present

## 2016-11-01 DIAGNOSIS — M6281 Muscle weakness (generalized): Secondary | ICD-10-CM | POA: Diagnosis not present

## 2016-11-01 DIAGNOSIS — M25552 Pain in left hip: Secondary | ICD-10-CM | POA: Diagnosis not present

## 2016-11-01 DIAGNOSIS — M7062 Trochanteric bursitis, left hip: Secondary | ICD-10-CM | POA: Diagnosis not present

## 2016-11-06 DIAGNOSIS — M25552 Pain in left hip: Secondary | ICD-10-CM | POA: Diagnosis not present

## 2016-11-06 DIAGNOSIS — M7062 Trochanteric bursitis, left hip: Secondary | ICD-10-CM | POA: Diagnosis not present

## 2016-11-06 DIAGNOSIS — M6281 Muscle weakness (generalized): Secondary | ICD-10-CM | POA: Diagnosis not present

## 2016-11-20 DIAGNOSIS — M7062 Trochanteric bursitis, left hip: Secondary | ICD-10-CM | POA: Diagnosis not present

## 2016-11-20 DIAGNOSIS — M6281 Muscle weakness (generalized): Secondary | ICD-10-CM | POA: Diagnosis not present

## 2016-11-20 DIAGNOSIS — M25552 Pain in left hip: Secondary | ICD-10-CM | POA: Diagnosis not present

## 2016-12-30 ENCOUNTER — Other Ambulatory Visit: Payer: Self-pay

## 2016-12-30 DIAGNOSIS — Z1231 Encounter for screening mammogram for malignant neoplasm of breast: Secondary | ICD-10-CM

## 2017-02-03 ENCOUNTER — Encounter: Payer: Self-pay | Admitting: Obstetrics & Gynecology

## 2017-02-03 ENCOUNTER — Ambulatory Visit (INDEPENDENT_AMBULATORY_CARE_PROVIDER_SITE_OTHER): Payer: PPO | Admitting: Obstetrics & Gynecology

## 2017-02-03 VITALS — BP 120/76 | HR 72 | Ht 64.0 in | Wt 184.0 lb

## 2017-02-03 DIAGNOSIS — Z124 Encounter for screening for malignant neoplasm of cervix: Secondary | ICD-10-CM

## 2017-02-03 DIAGNOSIS — Z1211 Encounter for screening for malignant neoplasm of colon: Secondary | ICD-10-CM | POA: Diagnosis not present

## 2017-02-03 DIAGNOSIS — Z01419 Encounter for gynecological examination (general) (routine) without abnormal findings: Secondary | ICD-10-CM | POA: Diagnosis not present

## 2017-02-03 DIAGNOSIS — Z0000 Encounter for general adult medical examination without abnormal findings: Secondary | ICD-10-CM

## 2017-02-03 NOTE — Progress Notes (Signed)
HPI:      [name] is a 66 y.o. [id number] who LMP was in the past, she presents today for her annual examination.  The patient has no complaints today. The patient is not currently sexually active. Herlast pap: approximate date 2013 and was normal and last mammogram: approximate date 2017 and was normal.  The patient does perform self breast exams.  There is no notable family history of breast or ovarian cancer in her family. The patient is not taking hormone replacement therapy. Patient denies post-menopausal vaginal bleeding.   The patient has regular exercise: yes. The patient denies current symptoms of depression.    GYN Hx: Last Colonoscopy:4 years ago. Normal.  Last DEXA: never ago.    PMHx: Past Medical History:  Diagnosis Date  . Arthritis 2009  . 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 Cancer [facility]) 2005   colon  . GERD (gastroesophageal reflux disease)   . Hypertension 2010  . Mammographic microcalcification 2013  . Mitral valve prolapse 2006  . 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 Obesity, unspecified 2014  . Personal history of colonic polyps 2013  . Special screening for malignant neoplasms, colon 2014  . Tubal pregnancy    Past Surgical History:  Procedure Laterality Date  . BREAST BIOPSY Left 2013  . BREAST SURGERY Left 2013   re excision of left breast showed small area of residual ADH, but she was not upsatged to DCIS or invasive cancer. The margins were clear.  Patient declined tamoxifen therapy  . COLONOSCOPY  [id number]  . COLONOSCOPY W/ POLYPECTOMY  2014  . ECTOPIC PREGNANCY SURGERY  1981  . kidney stones  2009  . SHOULDER SURGERY Right 2007  . TUBAL LIGATION  1981   Family History  Problem Relation Age of Onset  . Cancer Father 50       lung  . Breast cancer Neg Hx   . Ovarian cancer Neg Hx    Social History  Substance Use Topics  . Smoking status: Never Smoker  . Smokeless tobacco: Never Used  .  Alcohol use No    Current Outpatient Prescriptions:  .  clonazePAM (KLONOPIN) 0.5 MG tablet, take 1 tablet by mouth twice a day, Disp: , Rfl:  .  fluocinonide cream (LIDEX) 0.05 %, Apply topically., Disp: , Rfl:  .  hydrochlorothiazide (HYDRODIURIL) 25 MG tablet, Take 25 mg by mouth daily., Disp: , Rfl:  .  rOPINIRole (REQUIP) 1 MG tablet, Take by mouth., Disp: , Rfl:  .  diclofenac (VOLTAREN) 75 MG EC tablet, Take 75 mg by mouth 2 (two) times daily., Disp: , Rfl:  .  hydrocortisone cream 1 %, Apply to affected area 3 times daily (Patient not taking: Reported on 02/03/2017), Disp: 30 g, Rfl: 1 .  potassium chloride (K-DUR,KLOR-CON) 10 MEQ tablet, Take by mouth., Disp: , Rfl:  Allergies: Patient has no known allergies.  Review of Systems  Constitutional: Negative for chills, fever and malaise/fatigue.  HENT: Negative for congestion, sinus pain and sore throat.   Eyes: Negative for blurred vision and pain.  Respiratory: Negative for cough and wheezing.   Cardiovascular: Negative for chest pain and leg swelling.  Gastrointestinal: Negative for abdominal pain, constipation, diarrhea, heartburn, nausea and vomiting.  Genitourinary: Negative for dysuria, frequency, hematuria and urgency.  Musculoskeletal: Negative for back pain, joint pain, myalgias and neck pain.  Skin: Negative for itching and rash.  Neurological: Negative for dizziness, tremors  and weakness.  Endo/Heme/Allergies: Does not bruise/bleed easily.  Psychiatric/Behavioral: Negative for depression. The patient is not nervous/anxious and does not have insomnia.     Objective: BP 120/76   Pulse 72   Ht 5\' 4"  (1.626 m)   Wt 184 lb (83.5 kg)   BMI 31.58 kg/m   Filed Weights   02/03/17 1357  Weight: 184 lb (83.5 kg)   Body mass index is 31.58 kg/m. Physical Exam  Constitutional: She is oriented to person, place, and time. She appears well-developed and well-nourished. No distress.  Genitourinary: Rectum normal, vagina  normal and uterus normal. Pelvic exam was performed with patient supine. There is no rash or lesion on the right labia. There is no rash or lesion on the left labia. Vagina exhibits no lesion. No bleeding in the vagina. Right adnexum does not display mass and does not display tenderness. Left adnexum does not display mass and does not display tenderness. Cervix does not exhibit motion tenderness, lesion, friability or polyp.   Uterus is mobile and midaxial. Uterus is not enlarged or exhibiting a mass.  HENT:  Head: Normocephalic and atraumatic. Head is without laceration.  Right Ear: Hearing normal.  Left Ear: Hearing normal.  Nose: No epistaxis.  No foreign bodies.  Mouth/Throat: Uvula is midline, oropharynx is clear and moist and mucous membranes are normal.  Eyes: Pupils are equal, round, and reactive to light.  Neck: Normal range of motion. Neck supple. No thyromegaly present.  Cardiovascular: Normal rate and regular rhythm.  Exam reveals no gallop and no friction rub.   No murmur heard. Pulmonary/Chest: Effort normal and breath sounds normal. No respiratory distress. She has no wheezes. Right breast exhibits no mass, no skin change and no tenderness. Left breast exhibits no mass, no skin change and no tenderness.  Abdominal: Soft. Bowel sounds are normal. She exhibits no distension. There is no tenderness. There is no rebound.  Musculoskeletal: Normal range of motion.  Neurological: She is alert and oriented to person, place, and time. No cranial nerve deficit.  Skin: Skin is warm and dry.  Psychiatric: She has a normal mood and affect. Judgment normal.  Vitals reviewed.   Assessment: Annual Exam 1. Annual physical exam   2. Screen for colon cancer   3. Screening for cervical cancer     Plan:            1.  Cervical Screening-  Pap smear done today  2. Breast screening- Exam annually and mammogram scheduled  3. Colonoscopy every 10 years, Hemoccult testing after age 37  4.  Labs managed by PCP  5. Counseling for hormonal therapy: none, no change in therapy today     F/U  Return in about 1 year (around 02/03/2018) for Annual.  Barnett Applebaum, MD, [facility], [facility] 02/03/2017  2:24 PM

## 2017-02-03 NOTE — Patient Instructions (Signed)
PAP every three years Mammogram every year    Call [phone] to schedule at [facility] Colonoscopy every 5 years Labs yearly (with PCP)   

## 2017-02-04 LAB — IGP, APTIMA HPV: PAP Smear Comment: 0

## 2017-02-06 DIAGNOSIS — Z1211 Encounter for screening for malignant neoplasm of colon: Secondary | ICD-10-CM | POA: Diagnosis not present

## 2017-02-12 ENCOUNTER — Ambulatory Visit
Admission: RE | Admit: 2017-02-12 | Discharge: 2017-02-12 | Disposition: A | Discharge status: Home / Self Care | Payer: PPO | Source: Ambulatory Visit | Attending: General Surgery | Admitting: General Surgery

## 2017-02-12 DIAGNOSIS — Z1231 Encounter for screening mammogram for malignant neoplasm of breast: Secondary | ICD-10-CM | POA: Diagnosis not present

## 2017-02-12 DIAGNOSIS — N6489 Other specified disorders of breast: Secondary | ICD-10-CM | POA: Insufficient documentation

## 2017-02-12 DIAGNOSIS — R928 Other abnormal and inconclusive findings on diagnostic imaging of breast: Secondary | ICD-10-CM | POA: Insufficient documentation

## 2017-02-13 ENCOUNTER — Other Ambulatory Visit: Payer: Self-pay | Admitting: General Surgery

## 2017-02-13 DIAGNOSIS — R928 Other abnormal and inconclusive findings on diagnostic imaging of breast: Secondary | ICD-10-CM | POA: Diagnosis not present

## 2017-02-13 DIAGNOSIS — N6489 Other specified disorders of breast: Secondary | ICD-10-CM | POA: Diagnosis not present

## 2017-02-13 LAB — FECAL OCCULT BLOOD, IMMUNOCHEMICAL: Fecal Occult Bld: NEGATIVE

## 2017-02-19 ENCOUNTER — Telehealth: Payer: Self-pay | Admitting: General Surgery

## 2017-02-19 NOTE — Telephone Encounter (Signed)
PATIENT CALLED TO LET us KNOW SHE'S HAVING ADDDED VIEWS @ [facility] ON 02-20-17(RT BR)ORGINAL BILAT MAMMO WAS DONE 02-12-17.SHE HAS A RETURN APPOINTMENT WITH DR Bary Castilla 03-04-17

## 2017-02-20 ENCOUNTER — Ambulatory Visit
Admission: RE | Admit: 2017-02-20 | Discharge: 2017-02-20 | Disposition: A | Discharge status: Home / Self Care | Payer: PPO | Source: Ambulatory Visit | Attending: General Surgery | Admitting: General Surgery

## 2017-02-20 ENCOUNTER — Ambulatory Visit: Payer: PPO | Admitting: General Surgery

## 2017-02-20 DIAGNOSIS — R928 Other abnormal and inconclusive findings on diagnostic imaging of breast: Secondary | ICD-10-CM | POA: Diagnosis not present

## 2017-02-20 DIAGNOSIS — N6489 Other specified disorders of breast: Secondary | ICD-10-CM | POA: Insufficient documentation

## 2017-02-28 ENCOUNTER — Telehealth: Payer: Self-pay | Admitting: General Surgery

## 2017-02-28 NOTE — Telephone Encounter (Signed)
PATIENT CALLED IN ASKING IF [facility] HAS SENT A REQUEST FOR ORDERS FOR HER BR BX THEY WANT TO SCHEDULE.SHE STATES SHE HAS CALL FIVE TIMES AND LEFT MESSAGES WITH NO RETURN CALL WITH [facility].I EXPLAINED DR BYRNETT DOES BX'S TO & HE DOESN'T ALWAYS AGREE WITH THE RADIOLOGIST.SHE MAY WANT TO COME  IN FOR HER APPT ON 03-04-17 & TALK WITH DR Bary Castilla. SHE STATED SHE WANTED TO HAVE THE BX BEFORE SHE SAW HIM.THEY TOLD HER THAT DR BYRNETT DID A 2-D BX WHERE THEY DO A 3-D BX.I SPOKE WITH [facility] & THERE HAS BEEN NO REQUEST SENT.NOTHING IN THE SYSTEM.SPOKE BACK WITH PATIENT & ASK IF SHE WAS NOT GOING TO COME TO HER APPT TO PLEASE CALL OUR OFFICE.

## 2017-02-28 NOTE — Telephone Encounter (Signed)
Reports from right diagnostic mammogram and ultrasound reviewed. Patient desires to proceed with biopsy. Will have completed at [facility] as area notable only on 3D imaging.

## 2017-03-03 ENCOUNTER — Other Ambulatory Visit: Payer: Self-pay | Admitting: General Surgery

## 2017-03-03 ENCOUNTER — Other Ambulatory Visit: Payer: Self-pay | Admitting: *Deleted

## 2017-03-03 DIAGNOSIS — R928 Other abnormal and inconclusive findings on diagnostic imaging of breast: Secondary | ICD-10-CM

## 2017-03-03 DIAGNOSIS — N6489 Other specified disorders of breast: Secondary | ICD-10-CM | POA: Diagnosis not present

## 2017-03-03 NOTE — Telephone Encounter (Signed)
Biopsy at [facility] on 03-06-17 at 8 am.

## 2017-03-03 NOTE — Telephone Encounter (Signed)
Message to Sam in [place].   Order entered in [place].

## 2017-03-04 ENCOUNTER — Ambulatory Visit: Payer: PPO | Admitting: General Surgery

## 2017-03-06 ENCOUNTER — Ambulatory Visit
Admission: RE | Admit: 2017-03-06 | Discharge: 2017-03-06 | Disposition: A | Discharge status: Home / Self Care | Payer: PPO | Source: Ambulatory Visit | Attending: General Surgery | Admitting: General Surgery

## 2017-03-06 DIAGNOSIS — N6489 Other specified disorders of breast: Secondary | ICD-10-CM | POA: Diagnosis not present

## 2017-03-06 DIAGNOSIS — R928 Other abnormal and inconclusive findings on diagnostic imaging of breast: Secondary | ICD-10-CM | POA: Diagnosis not present

## 2017-03-06 HISTORY — PX: BREAST BIOPSY: SHX20

## 2017-03-07 ENCOUNTER — Telehealth: Payer: Self-pay | Admitting: General Surgery

## 2017-03-07 NOTE — Telephone Encounter (Signed)
The patient was notified that the stereotactic biopsy for distortion bleeding yesterday showed evidence of a radial scar without atypia.  The patient was apprised that there is some controversy between radiology and surgery about the need for formal surgical excision. Myself, uncomfortable with observation and would recommend a follow-up mammogram in 1 year.  The patient has an appointment scheduled for next week to review her biopsy results. She is welcome to come if she has new questions, otherwise she can plan on being seen in one year.

## 2017-03-12 ENCOUNTER — Encounter: Payer: Self-pay | Admitting: General Surgery

## 2017-03-12 ENCOUNTER — Ambulatory Visit (INDEPENDENT_AMBULATORY_CARE_PROVIDER_SITE_OTHER): Payer: PPO | Admitting: General Surgery

## 2017-03-12 VITALS — BP 130/74 | HR 72 | Resp 14 | Ht 64.0 in | Wt 187.0 lb

## 2017-03-12 DIAGNOSIS — N6099 Unspecified benign mammary dysplasia of unspecified breast: Secondary | ICD-10-CM

## 2017-03-12 DIAGNOSIS — N6489 Other specified disorders of breast: Secondary | ICD-10-CM | POA: Diagnosis not present

## 2017-03-12 LAB — SURGICAL PATHOLOGY

## 2017-03-12 NOTE — Progress Notes (Signed)
Patient ID: Melissa Rhodes, female   DOB: [birth year], 66 y.o.   MRN: [id number]  Chief Complaint  Patient presents with  . Follow-up    HPI Melissa Rhodes is a 66 y.o. female.  who presents for a breast evaluation. The most recent mammogram and right breast biopsy was done on .  Patient does perform regular self breast checks and gets regular mammograms done.  She could not feel anything different in the breast.  She did develop a blister from tape right breast.   HPI  Past Medical History:  Diagnosis Date  . Arthritis 2009  . 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 Cancer [facility]) 2005   colon  . GERD (gastroesophageal reflux disease)   . Hypertension 2010  . Mammographic microcalcification 2013  . Mitral valve prolapse 2006  . 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 Obesity, unspecified 2014  . Personal history of colonic polyps 2013  . Special screening for malignant neoplasms, colon 2014  . Tubal pregnancy     Past Surgical History:  Procedure Laterality Date  . BREAST BIOPSY Left 2013  . BREAST BIOPSY Right 03/06/2017   LIQ coil clip. FEATURES OF RADIAL SCAR  . BREAST SURGERY Left 2013   re excision of left breast showed small area of residual ADH, but she was not upsatged to DCIS or invasive cancer. The margins were clear.  Patient declined tamoxifen therapy  . COLONOSCOPY  [id number]  . COLONOSCOPY W/ POLYPECTOMY  2014  . ECTOPIC PREGNANCY SURGERY  1981  . kidney stones  2009  . SHOULDER SURGERY Right 2007  . TUBAL LIGATION  1981    Family History  Problem Relation Age of Onset  . Cancer Father 21       lung  . Breast cancer Neg Hx   . Ovarian cancer Neg Hx     Social History Social History  Substance Use Topics  . Smoking status: Never Smoker  . Smokeless tobacco: Never Used  . Alcohol use No    No Known Allergies  Current Outpatient Prescriptions  Medication Sig Dispense Refill  . clonazePAM (KLONOPIN) 0.5 MG  tablet take 1 tablet by mouth twice a day    . diclofenac (VOLTAREN) 75 MG EC tablet Take 75 mg by mouth 2 (two) times daily.    . fluocinonide cream (LIDEX) 0.05 % Apply topically.    . hydrochlorothiazide (HYDRODIURIL) 25 MG tablet Take 25 mg by mouth daily.    . potassium chloride (K-DUR,KLOR-CON) 10 MEQ tablet Take by mouth.    Marland Kitchen rOPINIRole (REQUIP) 1 MG tablet Take by mouth.    . tamoxifen (NOLVADEX) 20 MG tablet Take 1 tablet (20 mg total) by mouth daily. 30 tablet 11   No current facility-administered medications for this visit.     Review of Systems Review of Systems  Constitutional: Negative.   Respiratory: Negative.   Cardiovascular: Negative.     Blood pressure 130/74, pulse 72, resp. rate 14, height 5\' 4"  (1.626 m), weight 187 lb (84.8 kg).  Physical Exam Physical Exam  Constitutional: She is oriented to person, place, and time. She appears well-developed and well-nourished.  HENT:  Mouth/Throat: Oropharynx is clear and moist.  Eyes: Conjunctivae are normal. No scleral icterus.  Neck: Neck supple.  Cardiovascular: Normal rate, regular rhythm and normal heart sounds.   Pulmonary/Chest: Effort normal and breath sounds normal. Right breast exhibits skin change. Right breast exhibits no inverted nipple, no  mass, no nipple discharge and no tenderness. Left breast exhibits no inverted nipple, no mass, no nipple discharge, no skin change and no tenderness.    Skin irritation from bandage right breast  Lymphadenopathy:    She has no cervical adenopathy.    She has no axillary adenopathy.  Neurological: She is alert and oriented to person, place, and time.  Skin: Skin is warm and dry.  Psychiatric: Her behavior is normal.    Data Reviewed 02/13/2017 screening mammograms that suggested a distortion in the right breast. Diagnostic studies dated 02/20/2017 confirmed in the patient underwent a 3-D mammogram directed stereo biopsy on 03/10/2017.  DIAGNOSIS:  A. RIGHT  BREAST, LOWER INNER QUADRANT; STEREOTACTIC BIOPSY:  - FEATURES OF RADIAL SCAR.  - NEGATIVE FOR ATYPIA AND MALIGNANCY.  Measurement: aggregate, 5.2 x 2.5 x 0.2 cm ( 2 cu cm of tissue). Longest dimension: 9 mm.  Assessment    Past history ADH. Patient declined chemoprevention in 20123.  Radial scar, < 10 mm in diameter.    Plan    Indications for formal excision of radial scar depending on size. If less than 10 mm likelihood of missed malignancy as well on the lowest single digits. Would only recommend observation.  Patient brought up whether she would be a candidate for chemoprevention at this time. Based on her 2 previous biopsies, her [facility] model risk for breast cancer is 4.4% over 5 years, 15% lifetime. She would meet criteria for chemoprevention.   I spoke with the patient by phone the afternoon on 03/17/2017 with her addended biopsy report became available. We reviewed the pros and cons of chemoprevention. Risks of DVT 1% and uterine cancer 1% reviewed. She is a nonsmoker and will take a baby aspirin daily with the tamoxifen. I've asked her to make use of a trial for 1 month recognizing that if she gets vasomotor symptoms he is usually resolve or markedly improved by week 4. We'll plan on getting the other one month to review her tolerance of the medication.    Patient will be asked to return to the office in one year with a bilateral screening mammogram.   HPI, Physical Exam, Assessment and Plan have been scribed under the direction and in the presence of Robert Bellow, MD. Karie Fetch, RN  I have completed the exam and reviewed the above documentation for accuracy and completeness.  I agree with the above.  Haematologist has been used and any errors in dictation or transcription are unintentional.  Hervey Ard, M.D., F.A.C.S.  Robert Bellow 03/17/2017, 4:51 PM

## 2017-03-12 NOTE — Patient Instructions (Addendum)
The patient is aware to call back for any questions or concerns. Patient will be asked to return to the office in one year with a bilateral screening mammogram. 

## 2017-03-17 MED ORDER — TAMOXIFEN CITRATE 20 MG PO TABS: 20.0000 mg | ORAL_TABLET | Freq: Every day | ORAL | 11 refills | Status: DC

## 2017-04-17 ENCOUNTER — Ambulatory Visit: Payer: PPO | Admitting: General Surgery

## 2017-04-17 DIAGNOSIS — N63 Unspecified lump in unspecified breast: Secondary | ICD-10-CM | POA: Diagnosis not present

## 2017-04-17 DIAGNOSIS — N6459 Other signs and symptoms in breast: Secondary | ICD-10-CM | POA: Diagnosis not present

## 2017-04-22 ENCOUNTER — Telehealth: Payer: Self-pay

## 2017-04-22 NOTE — Telephone Encounter (Signed)
Patient called to cancel her appointment to follow up to discuss how she is doing on Tamoxifen. She reports that she is doing great on it and not really having any sideeffects. She reports no hot flashes or any fatigue. She did not feel the need to be seen at this time as she is doing so well on this medication.   She would like for her prescription for Tamoxifen to be changed to 90 day prescription as it is cheaper for her.

## 2017-04-23 ENCOUNTER — Ambulatory Visit: Payer: PPO | Admitting: General Surgery

## 2017-04-23 ENCOUNTER — Other Ambulatory Visit: Payer: Self-pay

## 2017-04-23 MED ORDER — TAMOXIFEN CITRATE 20 MG PO TABS: 20.0000 mg | ORAL_TABLET | Freq: Every day | ORAL | 3 refills | Status: DC

## 2017-04-23 NOTE — Telephone Encounter (Signed)
Patient notified of prescription change and side effects to watch out for. She is aware to take an 81 mg Aspirin daily.

## 2017-04-23 NOTE — Telephone Encounter (Signed)
OK to skip follow up.  Send in RX for 90 day supply of Tamoxifen, 20 mg daily. Remind patient to take with 81 mg ASA tablet. To call if leg swelling/ pain, vaginal bleeding.  F/U after September 2019 bilateral diagnostic mammograms.

## 2017-04-25 DIAGNOSIS — R928 Other abnormal and inconclusive findings on diagnostic imaging of breast: Secondary | ICD-10-CM | POA: Diagnosis not present

## 2017-04-30 DIAGNOSIS — N6489 Other specified disorders of breast: Secondary | ICD-10-CM | POA: Diagnosis not present

## 2017-05-06 DIAGNOSIS — N6489 Other specified disorders of breast: Secondary | ICD-10-CM | POA: Diagnosis not present

## 2017-05-06 DIAGNOSIS — N6021 Fibroadenosis of right breast: Secondary | ICD-10-CM | POA: Diagnosis not present

## 2017-05-06 DIAGNOSIS — N6011 Diffuse cystic mastopathy of right breast: Secondary | ICD-10-CM | POA: Diagnosis not present

## 2017-05-06 DIAGNOSIS — I10 Essential (primary) hypertension: Secondary | ICD-10-CM | POA: Diagnosis not present

## 2017-05-06 DIAGNOSIS — L905 Scar conditions and fibrosis of skin: Secondary | ICD-10-CM | POA: Diagnosis not present

## 2017-05-06 DIAGNOSIS — D241 Benign neoplasm of right breast: Secondary | ICD-10-CM | POA: Diagnosis not present

## 2017-05-06 DIAGNOSIS — Z85038 Personal history of other malignant neoplasm of large intestine: Secondary | ICD-10-CM | POA: Diagnosis not present

## 2017-05-06 DIAGNOSIS — M1990 Unspecified osteoarthritis, unspecified site: Secondary | ICD-10-CM | POA: Diagnosis not present

## 2017-05-16 DIAGNOSIS — N6489 Other specified disorders of breast: Secondary | ICD-10-CM | POA: Diagnosis not present

## 2017-05-16 DIAGNOSIS — Z09 Encounter for follow-up examination after completed treatment for conditions other than malignant neoplasm: Secondary | ICD-10-CM | POA: Diagnosis not present

## 2017-05-29 DIAGNOSIS — Z780 Asymptomatic menopausal state: Secondary | ICD-10-CM | POA: Diagnosis not present

## 2017-06-10 DIAGNOSIS — C439 Malignant melanoma of skin, unspecified: Secondary | ICD-10-CM

## 2017-06-10 HISTORY — DX: Malignant melanoma of skin, unspecified: C43.9

## 2017-07-09 ENCOUNTER — Ambulatory Visit (INDEPENDENT_AMBULATORY_CARE_PROVIDER_SITE_OTHER): Payer: PPO | Admitting: Internal Medicine

## 2017-07-09 ENCOUNTER — Encounter: Payer: Self-pay | Admitting: Internal Medicine

## 2017-07-09 VITALS — BP 148/98 | HR 71 | Temp 98.9°F | Resp 18 | Wt 189.6 lb

## 2017-07-09 DIAGNOSIS — E7800 Pure hypercholesterolemia, unspecified: Secondary | ICD-10-CM | POA: Insufficient documentation

## 2017-07-09 DIAGNOSIS — K219 Gastro-esophageal reflux disease without esophagitis: Secondary | ICD-10-CM

## 2017-07-09 DIAGNOSIS — N6099 Unspecified benign mammary dysplasia of unspecified breast: Secondary | ICD-10-CM

## 2017-07-09 DIAGNOSIS — Z86010 Personal history of colonic polyps: Secondary | ICD-10-CM | POA: Diagnosis not present

## 2017-07-09 DIAGNOSIS — I10 Essential (primary) hypertension: Secondary | ICD-10-CM

## 2017-07-09 LAB — CBC WITH DIFFERENTIAL/PLATELET
Basophils Absolute: 0 10*3/uL (ref 0.0–0.1)
Basophils Relative: 0.5 % (ref 0.0–3.0)
EOS ABS: 0 10*3/uL (ref 0.0–0.7)
EOS PCT: 1.1 % (ref 0.0–5.0)
HCT: 42.9 % (ref 36.0–46.0)
Hemoglobin: 14.7 g/dL (ref 12.0–15.0)
LYMPHS ABS: 1.1 10*3/uL (ref 0.7–4.0)
Lymphocytes Relative: 28.7 % (ref 12.0–46.0)
MCHC: 34.1 g/dL (ref 30.0–36.0)
MCV: 86.2 fl (ref 78.0–100.0)
MONO ABS: 0.3 10*3/uL (ref 0.1–1.0)
Monocytes Relative: 7.8 % (ref 3.0–12.0)
NEUTROS PCT: 61.9 % (ref 43.0–77.0)
Neutro Abs: 2.4 10*3/uL (ref 1.4–7.7)
Platelets: 177 10*3/uL (ref 150.0–400.0)
RBC: 4.98 Mil/uL (ref 3.87–5.11)
RDW: 13.8 % (ref 11.5–15.5)
WBC: 3.9 10*3/uL — ABNORMAL LOW (ref 4.0–10.5)

## 2017-07-09 LAB — BASIC METABOLIC PANEL
BUN: 18 mg/dL (ref 6–23)
CALCIUM: 8.6 mg/dL (ref 8.4–10.5)
CO2: 30 meq/L (ref 19–32)
CREATININE: 0.73 mg/dL (ref 0.40–1.20)
Chloride: 107 mEq/L (ref 96–112)
GFR: 84.51 mL/min (ref >60.00)
GLUCOSE: 106 mg/dL — AB (ref 70–99)
Potassium: 3.7 mEq/L (ref 3.5–5.1)
Sodium: 142 mEq/L (ref 135–145)

## 2017-07-09 LAB — LIPID PANEL
Cholesterol: 183 mg/dL (ref 0–200)
HDL: 47.8 mg/dL (ref >39.00)
LDL CALC: 120 mg/dL — AB (ref 0–99)
NonHDL: 135.32
TRIGLYCERIDES: 75 mg/dL (ref 0.0–149.0)
Total CHOL/HDL Ratio: 4
VLDL: 15 mg/dL (ref 0.0–40.0)

## 2017-07-09 LAB — TSH: TSH: 4.12 u[IU]/mL (ref 0.35–4.50)

## 2017-07-09 LAB — HEPATIC FUNCTION PANEL
ALT: 25 U/L (ref 0–35)
AST: 15 U/L (ref 0–37)
Albumin: 4 g/dL (ref 3.5–5.2)
Alkaline Phosphatase: 48 U/L (ref 39–117)
BILIRUBIN DIRECT: 0.1 mg/dL (ref 0.0–0.3)
BILIRUBIN TOTAL: 0.4 mg/dL (ref 0.2–1.2)
Total Protein: 6.3 g/dL (ref 6.0–8.3)

## 2017-07-09 MED ORDER — PANTOPRAZOLE SODIUM 40 MG PO TBEC: 40.0000 mg | DELAYED_RELEASE_TABLET | Freq: Every day | ORAL | 1 refills | Status: DC

## 2017-07-09 MED ORDER — LISINOPRIL 10 MG PO TABS: 10.0000 mg | ORAL_TABLET | Freq: Every day | ORAL | 1 refills | Status: DC

## 2017-07-09 NOTE — Patient Instructions (Signed)
Start lisinopril 10mg  per day.  Follow your pressures.    Start protonix - 30 minutes before your evening meal.

## 2017-07-09 NOTE — Progress Notes (Signed)
Patient ID: Melissa Rhodes, female   DOB: 1951-01-13, 67 y.o.   MRN: [id number]   Subjective:    Patient ID: Melissa Rhodes, female    DOB: 1951-01-05, 67 y.o.   MRN: [id number]  HPI  Patient here to establish care.  Former pt of Dr Gilford Rile.  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  Tamoxifen was recommended at that time. She took for a few days and stopped secondary to side effects.  She had breast biopsy right breast 03/06/17 - biopsy - radial scar versus complex sclerosing lesion.  S/p removal.  On arimidex now.  Some joint aches related to the arimidex.  She is followed at [facility] for her gyn care.  States up to date with pap smears.  Sees Dr Kenton Kingfisher.  She also has a history of hypertension, hypercholesterolemia, chronic neutropenia and restless leg syndrome.  Also has had problems with left hip pain and leg pain.  Has seen orthopedics.  She is on hctz for her blood pressure.  Blood pressure has been elevated recently.  States blood pressures have been averaging 140s/90s.  No chest pain.  Breathing stable.  Has a history of MVP.  Has noticed some acid reflux.  Takes TUMS at night.  States she is up to date with vaccines.  Discussed new shingles vaccine.  Takes clonazepam and requip for restless legs. Has a history of colon polyps.  Up to date with colonoscopy - per pt.  Last 11/2012.  Due this year.  Followed by Dr Bary Castilla.  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  Lives with her husband.  Tries to stay active.   Past Medical History:  Diagnosis Date  . Arthritis 2009  . 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 Cancer [facility]) 2005   colon  . GERD (gastroesophageal reflux disease)   . Hypertension 2010  . Mammographic microcalcification 2013  . Mitral valve prolapse 2006  . 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 Obesity, unspecified 2014  . Personal history of colonic polyps 2013  . Special screening for malignant  neoplasms, colon 2014  . Tubal pregnancy    Past Surgical History:  Procedure Laterality Date  . BREAST BIOPSY Left 2013  . BREAST BIOPSY Right 03/06/2017   LIQ coil clip. FEATURES OF RADIAL SCAR  . BREAST SURGERY Left 2013   re excision of left breast showed small area of residual ADH, but she was not upsatged to DCIS or invasive cancer. The margins were clear.  Patient declined tamoxifen therapy  . COLONOSCOPY  [id number]  . COLONOSCOPY W/ POLYPECTOMY  2014  . ECTOPIC PREGNANCY SURGERY  1981  . kidney stones  2009  . SHOULDER SURGERY Right 2007  . TUBAL LIGATION  1981   Family History  Problem Relation Age of Onset  . Cancer Father 30       lung  . Early death Father   . Arthritis Mother   . Depression Mother   . Diabetes Mother   . Heart disease Mother   . Hypertension Mother   . Hyperlipidemia Mother   . Stroke Mother   . Depression Sister   . Diabetes Sister   . Hyperlipidemia Sister   . Hypertension Sister   . Depression Sister   . Hyperlipidemia Sister   . Hypertension Sister   . Miscarriages / Stillbirths Sister   . Depression Sister   . Hyperlipidemia Sister   .  Hypertension Sister   . Depression Brother   . Diabetes Brother   . Hyperlipidemia Brother   . Hypertension Brother   . Stroke Brother   . Breast cancer Neg Hx   . Ovarian cancer Neg Hx    Social History   Socioeconomic History  . Marital status: Married    Spouse name: None  . Number of children: None  . Years of education: None  . Highest education level: None  Social Needs  . Financial resource strain: None  . Food insecurity - worry: None  . Food insecurity - inability: None  . Transportation needs - medical: None  . Transportation needs - non-medical: None  Occupational History  . None  Tobacco Use  . Smoking status: Never Smoker  . Smokeless tobacco: Never Used  Substance and Sexual Activity  . Alcohol use: No  . Drug use: No  . Sexual activity: No  Other Topics Concern  .  None  Social History Narrative  . None    Outpatient Encounter Medications as of 07/09/2017  Medication Sig  . anastrozole (ARIMIDEX) 1 MG tablet   . clonazePAM (KLONOPIN) 0.5 MG tablet Take 0.5 mg by mouth.  Marland Kitchen aspirin 81 MG tablet   . fluocinonide cream (LIDEX) 0.05 % Apply topically.  . hydrochlorothiazide (HYDRODIURIL) 25 MG tablet Take 25 mg by mouth daily.  Marland Kitchen lisinopril (PRINIVIL,ZESTRIL) 10 MG tablet Take 1 tablet (10 mg total) by mouth daily.  . pantoprazole (PROTONIX) 40 MG tablet Take 1 tablet (40 mg total) by mouth daily. Take 30 minutes before your evening meal  . potassium chloride (K-DUR,KLOR-CON) 10 MEQ tablet Take by mouth.  Marland Kitchen rOPINIRole (REQUIP) 1 MG tablet Take by mouth.  . [DISCONTINUED] clonazePAM (KLONOPIN) 0.5 MG tablet take 1 tablet by mouth twice a day  . [DISCONTINUED] diclofenac (VOLTAREN) 75 MG EC tablet Take 75 mg by mouth 2 (two) times daily.  . [DISCONTINUED] HYDROcodone-acetaminophen (NORCO/VICODIN) 5-325 MG tablet   . [DISCONTINUED] tamoxifen (NOLVADEX) 20 MG tablet Take 1 tablet (20 mg total) daily by mouth.   No facility-administered encounter medications on file as of 07/09/2017.     Review of Systems  Constitutional: Negative for appetite change and unexpected weight change.  HENT: Negative for congestion and sinus pressure.   Respiratory: Negative for cough, chest tightness and shortness of breath.   Cardiovascular: Negative for chest pain, palpitations and leg swelling.  Gastrointestinal: Negative for abdominal pain, diarrhea, nausea and vomiting.       Acid reflux as outlined.  Genitourinary: Negative for difficulty urinating and dysuria.  Musculoskeletal: Negative for joint swelling and myalgias.  Skin: Negative for color change and rash.  Neurological: Negative for dizziness, light-headedness and headaches.  Psychiatric/Behavioral: Negative for agitation and dysphoric mood.       Objective:    Physical Exam  Constitutional: She appears  well-developed and well-nourished. No distress.  HENT:  Nose: Nose normal.  Mouth/Throat: Oropharynx is clear and moist.  Neck: Neck supple. No thyromegaly present.  Cardiovascular: Normal rate and regular rhythm.  Pulmonary/Chest: Breath sounds normal. No respiratory distress. She has no wheezes.  Abdominal: Soft. Bowel sounds are normal. There is no tenderness.  Musculoskeletal: She exhibits no edema or tenderness.  Lymphadenopathy:    She has no cervical adenopathy.  Skin: No rash noted. No erythema.  Psychiatric: She has a normal mood and affect. Her behavior is normal.    BP (!) 148/98 (BP Location: Right Arm, Patient Position: Sitting, Cuff Size: Large)  Pulse 71   Temp 98.9 F (37.2 C) (Oral)   Resp 18   Wt 189 lb 9.6 oz (86 kg)   SpO2 98%   BMI 32.54 kg/m  Wt Readings from Last 3 Encounters:  07/09/17 189 lb 9.6 oz (86 kg)  03/12/17 187 lb (84.8 kg)  02/03/17 184 lb (83.5 kg)     Lab Results  Component Value Date   WBC 3.9 (L) 07/09/2017   HGB 14.7 07/09/2017   HCT 42.9 07/09/2017   PLT 177.0 07/09/2017   GLUCOSE 106 (H) 07/09/2017   CHOL 183 07/09/2017   TRIG 75.0 07/09/2017   HDL 47.80 07/09/2017   LDLCALC 120 (H) 07/09/2017   ALT 25 07/09/2017   AST 15 07/09/2017   NA 142 07/09/2017   K 3.7 07/09/2017   CL 107 07/09/2017   CREATININE 0.73 07/09/2017   BUN 18 07/09/2017   CO2 30 07/09/2017   TSH 4.12 07/09/2017    Mm Clip Placement Right  Result Date: 03/06/2017 CLINICAL DATA:  Status post tomosynthesis guided right breast biopsy EXAM: 3D DIAGNOSTIC RIGHT MAMMOGRAM POST STEREOTACTIC BIOPSY COMPARISON:  Previous exam(s). FINDINGS: 3D Mammographic images were obtained following tomosynthesis guided biopsy of an indeterminate right breast distortion. Post biopsy mammogram demonstrates the coil shaped biopsy marker to be in the expected location in the lower, slightly inner right breast. IMPRESSION: Appropriate marker position as above. Final Assessment:  Post Procedure Mammograms for Marker Placement Electronically Signed   By: Pamelia Hoit M.D.   On: 03/06/2017 08:45   Mm Rt Breast Bx W Loc Dev 1st Lesion Image Bx Spec Stereo Guide  Addendum Date: 03/10/2017   ADDENDUM REPORT: 03/07/2017 15:41 ADDENDUM: Pathology of the right breast biopsy revealed A. RIGHT BREAST, LOWER INNER QUADRANT; STEREOTACTIC BIOPSY: FEATURES OF RADIAL SCAR. NEGATIVE FOR ATYPIA AND MALIGNANCY. This was found to be concordant with Dr. Raul Del impression and notes. Recommendation: Surgical consultation for excision. The patient has a follow-up appointment with Dr. Bary Castilla on 03/12/17 at 4:30 PM. At the patient's request, results and recommendations were relayed to the patient by phone by Jetta Lout, [place] on 03/07/17. The patient stated she has had a skin sensitivity to the bandage. She removed the bandage and applied a clean bandage with paper tape. Post biopsy instructions were reviewed. All of her questions were answered. She was encouraged to contact the [facility] with any further questions or concerns. Addendum by Jetta Lout, RRA on 03/07/17. Electronically Signed   By: Pamelia Hoit M.D.   On: 03/07/2017 15:41   Result Date: 03/10/2017 CLINICAL DATA:  67 year old female for tomosynthesis guided biopsy of an indeterminate right breast distortion EXAM: RIGHT BREAST STEREOTACTIC CORE NEEDLE BIOPSY COMPARISON:  Previous exams. FINDINGS: The patient and I discussed the procedure of stereotactic/ tomosynthesis -guided biopsy including benefits and alternatives. We discussed the high likelihood of a successful procedure. We discussed the risks of the procedure including infection, bleeding, tissue injury, clip migration, and inadequate sampling. Informed written consent was given. The usual time out protocol was performed immediately prior to the procedure. Using sterile technique and 1% Lidocaine as local anesthetic, under stereotactic guidance, a 9 gauge vacuum assisted device was  used to perform core needle biopsy of a distortion in the lower, slightly inner right breast using a medial to lateral approach. Specimen radiograph was performed. Lesion quadrant: Lower inner quadrant At the conclusion of the procedure, a coil shaped tissue marker clip was deployed into the biopsy cavity. Follow-up 2-view mammogram was performed  and dictated separately. IMPRESSION: Stereotactic/ tomosynthesis -guided biopsy of an indeterminate right breast distortion. No apparent complications. Electronically Signed: By: Pamelia Hoit M.D. On: 03/06/2017 08:44       Assessment & Plan:   Problem List Items Addressed This Visit    Atypical ductal hyperplasia of breast - Primary    Documented history of atypical ductal hyperplasia.  S/p removal.  On arimedex.  Seeing oncology.  Follow.        GERD (gastroesophageal reflux disease)    Persistent reflux.  Takes TUMS.  Start protonix.  Follow.  Get her back in soon to reassess.        Relevant Medications   pantoprazole (PROTONIX) 40 MG tablet   History of colon polyps    Up to date with colonoscopy.  Followed by Dr Bary Castilla.        Hypercholesterolemia    Low cholesterol diet and exercise.  Follow lipid panel.  On no medication.        Relevant Medications   aspirin 81 MG tablet   lisinopril (PRINIVIL,ZESTRIL) 10 MG tablet   Other Relevant Orders   CBC with Differential/Platelet (Completed)   Hepatic function panel (Completed)   Lipid panel (Completed)   TSH (Completed)   Basic metabolic panel (Completed)   Hypertension    On hctz.  Given persistent increase, will have her start lisinopril '10mg'$  q day.  Follow pressures.  Get her back in soon to reassess.  Check met b in 10-14 days.        Relevant Medications   aspirin 81 MG tablet   lisinopril (PRINIVIL,ZESTRIL) 10 MG tablet       Einar Pheasant, MD

## 2017-07-10 ENCOUNTER — Encounter: Payer: Self-pay | Admitting: Internal Medicine

## 2017-07-12 ENCOUNTER — Encounter: Payer: Self-pay | Admitting: Internal Medicine

## 2017-07-12 ENCOUNTER — Other Ambulatory Visit: Payer: Self-pay | Admitting: Internal Medicine

## 2017-07-12 DIAGNOSIS — K219 Gastro-esophageal reflux disease without esophagitis: Secondary | ICD-10-CM | POA: Insufficient documentation

## 2017-07-12 DIAGNOSIS — I10 Essential (primary) hypertension: Secondary | ICD-10-CM | POA: Insufficient documentation

## 2017-07-12 DIAGNOSIS — Z860100 Personal history of colon polyps, unspecified: Secondary | ICD-10-CM | POA: Insufficient documentation

## 2017-07-12 NOTE — Progress Notes (Signed)
Order placed for f/u met b 

## 2017-07-12 NOTE — Assessment & Plan Note (Signed)
On hctz.  Given persistent increase, will have her start lisinopril '10mg'$  q day.  Follow pressures.  Get her back in soon to reassess.  Check met b in 10-14 days.

## 2017-07-12 NOTE — Assessment & Plan Note (Signed)
Low cholesterol diet and exercise.  Follow lipid panel.  On no medication.   

## 2017-07-12 NOTE — Assessment & Plan Note (Signed)
Up to date with colonoscopy.  Followed by Dr Bary Castilla.

## 2017-07-12 NOTE — Assessment & Plan Note (Signed)
Documented history of atypical ductal hyperplasia.  S/p removal.  On arimedex.  Seeing oncology.  Follow.

## 2017-07-12 NOTE — Assessment & Plan Note (Signed)
Persistent reflux.  Takes TUMS.  Start protonix.  Follow.  Get her back in soon to reassess.

## 2017-07-22 ENCOUNTER — Other Ambulatory Visit (INDEPENDENT_AMBULATORY_CARE_PROVIDER_SITE_OTHER): Payer: PPO

## 2017-07-22 DIAGNOSIS — I10 Essential (primary) hypertension: Secondary | ICD-10-CM

## 2017-07-22 LAB — BASIC METABOLIC PANEL
BUN: 23 mg/dL (ref 6–23)
CHLORIDE: 106 meq/L (ref 96–112)
CO2: 30 mEq/L (ref 19–32)
Calcium: 8.8 mg/dL (ref 8.4–10.5)
Creatinine, Ser: 0.75 mg/dL (ref 0.40–1.20)
GFR: 81.9 mL/min (ref >60.00)
GLUCOSE: 117 mg/dL — AB (ref 70–99)
POTASSIUM: 3.4 meq/L — AB (ref 3.5–5.1)
SODIUM: 142 meq/L (ref 135–145)

## 2017-07-23 ENCOUNTER — Encounter: Payer: Self-pay | Admitting: *Deleted

## 2017-07-23 ENCOUNTER — Other Ambulatory Visit: Payer: Self-pay | Admitting: Internal Medicine

## 2017-07-23 DIAGNOSIS — E876 Hypokalemia: Secondary | ICD-10-CM

## 2017-07-23 NOTE — Progress Notes (Signed)
Order placed for f/u potassium check.  

## 2017-07-28 ENCOUNTER — Encounter: Payer: Self-pay | Admitting: Internal Medicine

## 2017-07-28 ENCOUNTER — Telehealth: Payer: Self-pay | Admitting: Internal Medicine

## 2017-07-28 DIAGNOSIS — Z0000 Encounter for general adult medical examination without abnormal findings: Secondary | ICD-10-CM | POA: Insufficient documentation

## 2017-07-28 MED ORDER — CLONAZEPAM 0.5 MG PO TABS: 0.5000 mg | ORAL_TABLET | Freq: Every day | ORAL | 1 refills | Status: DC

## 2017-07-28 NOTE — Telephone Encounter (Signed)
Last OV 07/09/17 and last refill 8/18 ok to fill clonazepam?

## 2017-07-28 NOTE — Telephone Encounter (Signed)
Patient has been taking for anxiety issues and restless legs for two years. Refilled and faxed to Pharmacy FYI.

## 2017-07-28 NOTE — Telephone Encounter (Signed)
New pt to me.  Confirm she has been taking this for a while.  Confirm how she is taking?  Ok to refill x 1.

## 2017-07-28 NOTE — Telephone Encounter (Signed)
clonazepam refill Last OV: 07/09/17 Last Refill:01/20/17 Pharmacy:[facility] [address], [place]

## 2017-07-28 NOTE — Telephone Encounter (Signed)
Copied from [employer] [id number]. Topic: Quick Communication - Rx Refill/Question >> Jul 28, 2017 10:49 AM Ahmed Prima L wrote: Medication:   clonazePAM (KLONOPIN) 0.5 MG tablet   Has the patient contacted their pharmacy? yes   (Agent: If no, request that the patient contact the pharmacy for the refill.)   Preferred Pharmacy (with phone number or street name): [id number] - [address] [place], [place] - [address]   Agent: Please be advised that RX refills may take up to 3 business days. We ask that you follow-up with your pharmacy.

## 2017-08-01 ENCOUNTER — Other Ambulatory Visit (INDEPENDENT_AMBULATORY_CARE_PROVIDER_SITE_OTHER): Payer: PPO

## 2017-08-01 DIAGNOSIS — E876 Hypokalemia: Secondary | ICD-10-CM | POA: Diagnosis not present

## 2017-08-01 LAB — POTASSIUM: Potassium: 3.7 mEq/L (ref 3.5–5.1)

## 2017-08-04 ENCOUNTER — Encounter: Payer: Self-pay | Admitting: Internal Medicine

## 2017-08-06 ENCOUNTER — Ambulatory Visit (INDEPENDENT_AMBULATORY_CARE_PROVIDER_SITE_OTHER): Payer: PPO | Admitting: Obstetrics & Gynecology

## 2017-08-06 ENCOUNTER — Encounter: Payer: Self-pay | Admitting: Obstetrics & Gynecology

## 2017-08-06 VITALS — BP 140/90 | HR 72 | Ht 64.0 in | Wt 186.0 lb

## 2017-08-06 DIAGNOSIS — T386X5A Adverse effect of antigonadotrophins, antiestrogens, antiandrogens, not elsewhere classified, initial encounter: Secondary | ICD-10-CM | POA: Diagnosis not present

## 2017-08-06 DIAGNOSIS — X32XXXA Exposure to sunlight, initial encounter: Secondary | ICD-10-CM | POA: Diagnosis not present

## 2017-08-06 DIAGNOSIS — N950 Postmenopausal bleeding: Secondary | ICD-10-CM | POA: Diagnosis not present

## 2017-08-06 DIAGNOSIS — D485 Neoplasm of uncertain behavior of skin: Secondary | ICD-10-CM | POA: Diagnosis not present

## 2017-08-06 DIAGNOSIS — L821 Other seborrheic keratosis: Secondary | ICD-10-CM | POA: Diagnosis not present

## 2017-08-06 DIAGNOSIS — C4362 Malignant melanoma of left upper limb, including shoulder: Secondary | ICD-10-CM | POA: Diagnosis not present

## 2017-08-06 DIAGNOSIS — D2361 Other benign neoplasm of skin of right upper limb, including shoulder: Secondary | ICD-10-CM | POA: Diagnosis not present

## 2017-08-06 DIAGNOSIS — L570 Actinic keratosis: Secondary | ICD-10-CM | POA: Diagnosis not present

## 2017-08-06 NOTE — Progress Notes (Signed)
Postmenopausal Bleeding Patient complains of vaginal bleeding. She has been menopausal for several years. Currently on no HRT.  She was on Tamoxifen from Oct to Jan, now on Arimidex.  Breast Bx then Excision was benign. Bleeding is described as scant staining and has occurred 4 times.  First episode 6 days ago.  Other menopausal symptoms include: none. Workup to date: none. No pain, menopausal sx's.  Menstrual History: OB History    Gravida Para Term Preterm AB Living   3       1 2    SAB TAB Ectopic Multiple Live Births       1          Obstetric Comments   Age with first pregnancy-22 Age first menstruation-13 LMP-age 71     No LMP recorded. Patient is postmenopausal.   PMHx: She  has a past medical history of Arthritis (2009), Breast ductal hyperplasia, atypical (10/2011), Cancer ([place]) (2005), GERD (gastroesophageal reflux disease), Hypertension (2010), Mammographic microcalcification (2013), Mitral valve prolapse (2006), Neoplasm of uncertain behavior of breast (2014), Obesity, unspecified (2014), Personal history of colonic polyps (2013), Special screening for malignant neoplasms, colon (2014), and Tubal pregnancy. Also,  has a past surgical history that includes Shoulder surgery (Right, 2007); Colonoscopy (8416,6063); Colonoscopy w/ polypectomy (2014); Ectopic pregnancy surgery (1981); Tubal ligation (1981); kidney stones (2009); Breast surgery (Left, 2013); Breast biopsy (Left, 2013); and Breast biopsy (Right, 03/06/2017)., family history includes Arthritis in her mother; Cancer (age of onset: 6) in her father; Depression in her brother, mother, sister, sister, and sister; Diabetes in her brother, mother, and sister; Early death in her father; Heart disease in her mother; Hyperlipidemia in her brother, mother, sister, sister, and sister; Hypertension in her brother, mother, sister, sister, and sister; Miscarriages / Stillbirths in her sister; Stroke in her brother and mother.,  reports  that  has never smoked. she has never used smokeless tobacco. She reports that she does not drink alcohol or use drugs.  She has a current medication list which includes the following prescription(s): anastrozole, aspirin, clonazepam, fluocinonide cream, hydrochlorothiazide, lisinopril, pantoprazole, potassium chloride, and ropinirole. Also, has No Known Allergies.  Review of Systems  Constitutional: Negative for chills, fever and malaise/fatigue.  HENT: Negative for congestion, sinus pain and sore throat.   Eyes: Negative for blurred vision and pain.  Respiratory: Negative for cough and wheezing.   Cardiovascular: Negative for chest pain and leg swelling.  Gastrointestinal: Negative for abdominal pain, constipation, diarrhea, heartburn, nausea and vomiting.  Genitourinary: Negative for dysuria, frequency, hematuria and urgency.  Musculoskeletal: Positive for joint pain. Negative for back pain, myalgias and neck pain.  Skin: Negative for itching and rash.  Neurological: Negative for dizziness, tremors and weakness.  Endo/Heme/Allergies: Does not bruise/bleed easily.  Psychiatric/Behavioral: Negative for depression. The patient is not nervous/anxious and does not have insomnia.    Objective: BP 140/90   Pulse 72   Ht 5\' 4"  (1.626 m)   Wt 186 lb (84.4 kg)   BMI 31.93 kg/m  Physical Exam  Constitutional: She is oriented to person, place, and time. She appears well-developed and well-nourished. No distress.  Genitourinary: Rectum normal, vagina normal and uterus normal. Pelvic exam was performed with patient supine. There is no rash or lesion on the right labia. There is no rash or lesion on the left labia. Vagina exhibits no lesion. No bleeding in the vagina. Right adnexum does not display mass and does not display tenderness. Left adnexum does not display mass and does not  display tenderness. Cervix does not exhibit motion tenderness, lesion, friability or polyp.   Uterus is mobile and  midaxial. Uterus is not enlarged or exhibiting a mass.  HENT:  Head: Normocephalic and atraumatic. Head is without laceration.  Right Ear: Hearing normal.  Left Ear: Hearing normal.  Nose: No epistaxis.  No foreign bodies.  Mouth/Throat: Uvula is midline, oropharynx is clear and moist and mucous membranes are normal.  Eyes: Pupils are equal, round, and reactive to light.  Neck: Normal range of motion. Neck supple. No thyromegaly present.  Cardiovascular: Normal rate and regular rhythm. Exam reveals no gallop and no friction rub.  No murmur heard. Pulmonary/Chest: Effort normal and breath sounds normal. No respiratory distress. She has no wheezes. Right breast exhibits no mass, no skin change and no tenderness. Left breast exhibits no mass, no skin change and no tenderness.  Abdominal: Soft. Bowel sounds are normal. She exhibits no distension. There is no tenderness. There is no rebound.  Musculoskeletal: Normal range of motion.  Neurological: She is alert and oriented to person, place, and time. No cranial nerve deficit.  Skin: Skin is warm and dry.  Psychiatric: She has a normal mood and affect. Judgment normal.  Vitals reviewed.  Endometrial Biopsy After discussion with the patient regarding her abnormal uterine bleeding I recommended that she proceed with an endometrial biopsy for further diagnosis. The risks, benefits, alternatives, and indications for an endometrial biopsy were discussed with the patient in detail. She understood the risks including infection, bleeding, cervical laceration and uterine perforation.  Verbal consent was obtained.   PROCEDURE NOTE:  [place] endometrial biopsy was performed using aseptic technique with iodine preparation.  The uterus was sounded to a length of 7 cm.  Adequate sampling was obtained with minimal blood loss.  The patient tolerated the procedure well.  Disposition will be pending pathology.  ASSESSMENT/PLAN:   Problem List Items Addressed This  Visit      Other   Postmenopausal bleeding - Primary    Other Visit Diagnoses    Adverse effect of tamoxifen, initial encounter        [place] if bleeding persists to assess for polyps, other etiology Call w EMB results    Options based on results discussed.  Barnett Applebaum, MD, [facility], [facility] 08/06/2017  9:58 AM

## 2017-08-06 NOTE — Patient Instructions (Signed)

## 2017-08-11 ENCOUNTER — Encounter: Payer: Self-pay | Admitting: Internal Medicine

## 2017-08-11 ENCOUNTER — Telehealth: Payer: Self-pay | Admitting: Internal Medicine

## 2017-08-11 ENCOUNTER — Ambulatory Visit (INDEPENDENT_AMBULATORY_CARE_PROVIDER_SITE_OTHER): Payer: PPO | Admitting: Internal Medicine

## 2017-08-11 DIAGNOSIS — E7800 Pure hypercholesterolemia, unspecified: Secondary | ICD-10-CM

## 2017-08-11 DIAGNOSIS — I10 Essential (primary) hypertension: Secondary | ICD-10-CM | POA: Diagnosis not present

## 2017-08-11 DIAGNOSIS — N950 Postmenopausal bleeding: Secondary | ICD-10-CM | POA: Diagnosis not present

## 2017-08-11 DIAGNOSIS — L989 Disorder of the skin and subcutaneous tissue, unspecified: Secondary | ICD-10-CM | POA: Diagnosis not present

## 2017-08-11 DIAGNOSIS — K219 Gastro-esophageal reflux disease without esophagitis: Secondary | ICD-10-CM

## 2017-08-11 DIAGNOSIS — N6099 Unspecified benign mammary dysplasia of unspecified breast: Secondary | ICD-10-CM | POA: Diagnosis not present

## 2017-08-11 NOTE — Assessment & Plan Note (Signed)
Blood pressure under much better control as outlined.  Continue current medication regimen.  Follow pressures.  Follow metabolic panel.

## 2017-08-11 NOTE — Telephone Encounter (Signed)
FYI

## 2017-08-11 NOTE — Assessment & Plan Note (Signed)
On protonix now.  Started last visit.  Doing well. No acid reflux. Now.  Remain on protonix for now.

## 2017-08-11 NOTE — Telephone Encounter (Signed)
Ok.  Thank her for calling with update and keep [place] posted.

## 2017-08-11 NOTE — Assessment & Plan Note (Signed)
Noticed recently.  Saw Dr Kenton Kingfisher last week.  Is s/p endometrial biopsy.  Awaiting results.  Having to change pads one time per day since the biopsy.  Due f/u this week with gyn.

## 2017-08-11 NOTE — Progress Notes (Signed)
Patient ID: Melissa Rhodes, female   DOB: December 02, 1950, 67 y.o.   MRN: [id number]   Subjective:    Patient ID: Melissa Rhodes, female    DOB: 07-01-1950, 67 y.o.   MRN: [id number]  HPI  Patient here for a scheduled follow up.  Here to follow up regarding her blood pressure.  Was started on lisinopril last visit.  Tolerating.  Blood pressures better.  Most readings now 110-120s/70-80.  No headache.  No dizziness.  No chest pain or sob.  No acid reflux.  No abdominal pain.  Bowels moving.  She is on arimidex for history of breast cancer.  Recent post menopausal bleeding.  Saw Dr Kenton Kingfisher.  S/p endometrial biopsy.  Waiting for results.  Has to wear and pad and change daily since her biopsy.  Planning for f/u this week.  Also saw dermatology last week.  Had lesion removed from arm.  Concern over possible melanoma.  Due to w/u this week regarding pathology for the arm lesion.  Discussed with her today.  Increased stress related to above.  Overall she feels she is handling things relatively well.   On protonix now for acid reflux.  No problems now.  Controlled.     Past Medical History:  Diagnosis Date  . Arthritis 2009  . 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 Cancer [facility]) 2005   colon  . GERD (gastroesophageal reflux disease)   . Hypertension 2010  . Mammographic microcalcification 2013  . Mitral valve prolapse 2006  . 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 Obesity, unspecified 2014  . Personal history of colonic polyps 2013  . Special screening for malignant neoplasms, colon 2014  . Tubal pregnancy    Past Surgical History:  Procedure Laterality Date  . BREAST BIOPSY Left 2013  . BREAST BIOPSY Right 03/06/2017   LIQ coil clip. FEATURES OF RADIAL SCAR  . BREAST SURGERY Left 2013   re excision of left breast showed small area of residual ADH, but she was not upsatged to DCIS or invasive cancer. The margins were clear.  Patient declined tamoxifen therapy  .  COLONOSCOPY  [id number]  . COLONOSCOPY W/ POLYPECTOMY  2014  . ECTOPIC PREGNANCY SURGERY  1981  . kidney stones  2009  . SHOULDER SURGERY Right 2007  . TUBAL LIGATION  1981   Family History  Problem Relation Age of Onset  . Cancer Father 31       lung  . Early death Father   . Arthritis Mother   . Depression Mother   . Diabetes Mother   . Heart disease Mother   . Hypertension Mother   . Hyperlipidemia Mother   . Stroke Mother   . Depression Sister   . Diabetes Sister   . Hyperlipidemia Sister   . Hypertension Sister   . Depression Sister   . Hyperlipidemia Sister   . Hypertension Sister   . Miscarriages / Stillbirths Sister   . Depression Sister   . Hyperlipidemia Sister   . Hypertension Sister   . Depression Brother   . Diabetes Brother   . Hyperlipidemia Brother   . Hypertension Brother   . Stroke Brother   . Breast cancer Neg Hx   . Ovarian cancer Neg Hx    Social History   Socioeconomic History  . Marital status: Married    Spouse name: None  . Number of children: None  . Years of education: None  .  Highest education level: None  Social Needs  . Financial resource strain: None  . Food insecurity - worry: None  . Food insecurity - inability: None  . Transportation needs - medical: None  . Transportation needs - non-medical: None  Occupational History  . None  Tobacco Use  . Smoking status: Never Smoker  . Smokeless tobacco: Never Used  Substance and Sexual Activity  . Alcohol use: No  . Drug use: No  . Sexual activity: No  Other Topics Concern  . None  Social History Narrative  . None    Outpatient Encounter Medications as of 08/11/2017  Medication Sig  . anastrozole (ARIMIDEX) 1 MG tablet   . aspirin 81 MG tablet   . clonazePAM (KLONOPIN) 0.5 MG tablet Take 1 tablet (0.5 mg total) by mouth daily.  . fluocinonide cream (LIDEX) 0.05 % Apply topically.  . hydrochlorothiazide (HYDRODIURIL) 25 MG tablet Take 25 mg by mouth daily.  Marland Kitchen lisinopril  (PRINIVIL,ZESTRIL) 10 MG tablet Take 1 tablet (10 mg total) by mouth daily.  . pantoprazole (PROTONIX) 40 MG tablet Take 1 tablet (40 mg total) by mouth daily. Take 30 minutes before your evening meal  . potassium chloride (K-DUR,KLOR-CON) 10 MEQ tablet Take by mouth.  Marland Kitchen rOPINIRole (REQUIP) 1 MG tablet Take by mouth.   No facility-administered encounter medications on file as of 08/11/2017.     Review of Systems  Constitutional: Negative for appetite change and unexpected weight change.  HENT: Negative for congestion and sinus pressure.   Respiratory: Negative for cough, chest tightness and shortness of breath.   Cardiovascular: Negative for chest pain, palpitations and leg swelling.  Gastrointestinal: Negative for abdominal pain, diarrhea, nausea and vomiting.  Genitourinary: Negative for difficulty urinating and dysuria.  Musculoskeletal: Negative for joint swelling and myalgias.  Skin: Negative for color change and rash.  Neurological: Negative for dizziness, light-headedness and headaches.  Psychiatric/Behavioral: Negative for agitation and dysphoric mood.       Objective:    Physical Exam  Constitutional: She appears well-developed and well-nourished. No distress.  HENT:  Nose: Nose normal.  Mouth/Throat: Oropharynx is clear and moist.  Neck: Neck supple. No thyromegaly present.  Cardiovascular: Normal rate and regular rhythm.  Pulmonary/Chest: Breath sounds normal. No respiratory distress. She has no wheezes.  Abdominal: Soft. Bowel sounds are normal. There is no tenderness.  Musculoskeletal: She exhibits no edema or tenderness.  Lymphadenopathy:    She has no cervical adenopathy.  Skin: No rash noted. No erythema.  Psychiatric: She has a normal mood and affect. Her behavior is normal.    BP 128/78 (BP Location: Left Arm, Patient Position: Sitting, Cuff Size: Large)   Pulse 66   Temp 98.6 F (37 C) (Oral)   Resp 18   Wt 186 lb 3.2 oz (84.5 kg)   SpO2 96%   BMI 31.96  kg/m  Wt Readings from Last 3 Encounters:  08/11/17 186 lb 3.2 oz (84.5 kg)  08/06/17 186 lb (84.4 kg)  07/09/17 189 lb 9.6 oz (86 kg)     Lab Results  Component Value Date   WBC 3.9 (L) 07/09/2017   HGB 14.7 07/09/2017   HCT 42.9 07/09/2017   PLT 177.0 07/09/2017   GLUCOSE 117 (H) 07/22/2017   CHOL 183 07/09/2017   TRIG 75.0 07/09/2017   HDL 47.80 07/09/2017   LDLCALC 120 (H) 07/09/2017   ALT 25 07/09/2017   AST 15 07/09/2017   NA 142 07/22/2017   K 3.7 08/01/2017   CL  106 07/22/2017   CREATININE 0.75 07/22/2017   BUN 23 07/22/2017   CO2 30 07/22/2017   TSH 4.12 07/09/2017    Mm Clip Placement Right  Result Date: 03/06/2017 CLINICAL DATA:  Status post tomosynthesis guided right breast biopsy EXAM: 3D DIAGNOSTIC RIGHT MAMMOGRAM POST STEREOTACTIC BIOPSY COMPARISON:  Previous exam(s). FINDINGS: 3D Mammographic images were obtained following tomosynthesis guided biopsy of an indeterminate right breast distortion. Post biopsy mammogram demonstrates the coil shaped biopsy marker to be in the expected location in the lower, slightly inner right breast. IMPRESSION: Appropriate marker position as above. Final Assessment: Post Procedure Mammograms for Marker Placement Electronically Signed   By: Pamelia Hoit M.D.   On: 03/06/2017 08:45   Mm Rt Breast Bx W Loc Dev 1st Lesion Image Bx Spec Stereo Guide  Addendum Date: 03/10/2017   ADDENDUM REPORT: 03/07/2017 15:41 ADDENDUM: Pathology of the right breast biopsy revealed A. RIGHT BREAST, LOWER INNER QUADRANT; STEREOTACTIC BIOPSY: FEATURES OF RADIAL SCAR. NEGATIVE FOR ATYPIA AND MALIGNANCY. This was found to be concordant with Dr. Raul Del impression and notes. Recommendation: Surgical consultation for excision. The patient has a follow-up appointment with Dr. Bary Castilla on 03/12/17 at 4:30 PM. At the patient's request, results and recommendations were relayed to the patient by phone by Jetta Lout, [place] on 03/07/17. The patient stated she has had a  skin sensitivity to the bandage. She removed the bandage and applied a clean bandage with paper tape. Post biopsy instructions were reviewed. All of her questions were answered. She was encouraged to contact the [facility] with any further questions or concerns. Addendum by Jetta Lout, RRA on 03/07/17. Electronically Signed   By: Pamelia Hoit M.D.   On: 03/07/2017 15:41   Result Date: 03/10/2017 CLINICAL DATA:  67 year old female for tomosynthesis guided biopsy of an indeterminate right breast distortion EXAM: RIGHT BREAST STEREOTACTIC CORE NEEDLE BIOPSY COMPARISON:  Previous exams. FINDINGS: The patient and I discussed the procedure of stereotactic/ tomosynthesis -guided biopsy including benefits and alternatives. We discussed the high likelihood of a successful procedure. We discussed the risks of the procedure including infection, bleeding, tissue injury, clip migration, and inadequate sampling. Informed written consent was given. The usual time out protocol was performed immediately prior to the procedure. Using sterile technique and 1% Lidocaine as local anesthetic, under stereotactic guidance, a 9 gauge vacuum assisted device was used to perform core needle biopsy of a distortion in the lower, slightly inner right breast using a medial to lateral approach. Specimen radiograph was performed. Lesion quadrant: Lower inner quadrant At the conclusion of the procedure, a coil shaped tissue marker clip was deployed into the biopsy cavity. Follow-up 2-view mammogram was performed and dictated separately. IMPRESSION: Stereotactic/ tomosynthesis -guided biopsy of an indeterminate right breast distortion. No apparent complications. Electronically Signed: By: Pamelia Hoit M.D. On: 03/06/2017 08:44       Assessment & Plan:   Problem List Items Addressed This Visit    Arm lesion    Saw Dr Evorn Gong.  S/p resection.  Waiting for pathology results.        Atypical ductal hyperplasia of breast    On  arimidex.  Followed by oncology.        GERD (gastroesophageal reflux disease)    On protonix now.  Started last visit.  Doing well. No acid reflux. Now.  Remain on protonix for now.        Hypercholesterolemia    On no cholesterol medication.  Low cholesterol diet and exercise.  Follow lipid panel.        Hypertension    Blood pressure under much better control as outlined.  Continue current medication regimen.  Follow pressures.  Follow metabolic panel.        Postmenopausal bleeding    Noticed recently.  Saw Dr Kenton Kingfisher last week.  Is s/p endometrial biopsy.  Awaiting results.  Having to change pads one time per day since the biopsy.  Due f/u this week with gyn.            Einar Pheasant, MD

## 2017-08-11 NOTE — Telephone Encounter (Signed)
Copied from [place]. Topic: Inquiry >> Aug 11, 2017  1:55 PM Scherrie Gerlach wrote: Reason for CRM: pt called back to advise the pathology reports is  Melanoma.  They are referring her to a surgical oncologist at [facility]  Pt dermatologist will send Dr Nicki Reaper the report

## 2017-08-12 ENCOUNTER — Encounter: Payer: Self-pay | Admitting: Internal Medicine

## 2017-08-12 DIAGNOSIS — L989 Disorder of the skin and subcutaneous tissue, unspecified: Secondary | ICD-10-CM | POA: Insufficient documentation

## 2017-08-12 LAB — PATHOLOGY

## 2017-08-12 NOTE — Assessment & Plan Note (Signed)
Saw Dr Evorn Gong.  S/p resection.  Waiting for pathology results.

## 2017-08-12 NOTE — Assessment & Plan Note (Signed)
On no cholesterol medication.  Low cholesterol diet and exercise.  Follow lipid panel.   

## 2017-08-12 NOTE — Assessment & Plan Note (Signed)
On arimidex. Followed by oncology.  

## 2017-08-13 DIAGNOSIS — C4362 Malignant melanoma of left upper limb, including shoulder: Secondary | ICD-10-CM | POA: Diagnosis not present

## 2017-08-15 DIAGNOSIS — C4362 Malignant melanoma of left upper limb, including shoulder: Secondary | ICD-10-CM | POA: Diagnosis not present

## 2017-09-02 DIAGNOSIS — C4362 Malignant melanoma of left upper limb, including shoulder: Secondary | ICD-10-CM | POA: Diagnosis not present

## 2017-09-02 DIAGNOSIS — I10 Essential (primary) hypertension: Secondary | ICD-10-CM | POA: Diagnosis not present

## 2017-09-02 DIAGNOSIS — K219 Gastro-esophageal reflux disease without esophagitis: Secondary | ICD-10-CM | POA: Diagnosis not present

## 2017-09-02 DIAGNOSIS — Z85038 Personal history of other malignant neoplasm of large intestine: Secondary | ICD-10-CM | POA: Diagnosis not present

## 2017-09-02 DIAGNOSIS — Z7982 Long term (current) use of aspirin: Secondary | ICD-10-CM | POA: Diagnosis not present

## 2017-09-08 ENCOUNTER — Telehealth: Payer: Self-pay | Admitting: Internal Medicine

## 2017-09-08 ENCOUNTER — Other Ambulatory Visit: Payer: Self-pay

## 2017-09-08 MED ORDER — LISINOPRIL 10 MG PO TABS: 10.0000 mg | ORAL_TABLET | Freq: Every day | ORAL | 0 refills | Status: DC

## 2017-09-08 NOTE — Telephone Encounter (Signed)
rx sent in 

## 2017-09-08 NOTE — Telephone Encounter (Signed)
Copied from [place]. Topic: Quick Communication - See Telephone Encounter >> Sep 08, 2017  8:52 AM Conception Chancy, NT wrote: CRM for notification. See Telephone encounter for: 09/08/17.  Patient is calling and is needing a refill on lisinopril (PRINIVIL,ZESTRIL) 10 MG tablet   she would like to know can she get a 90 day supply instead of 30/ please advise.   [id number] - [name], [place] - [place] AT [place]  [address] [place] [address]  Phone: [phone] Fax: [phone]

## 2017-09-10 DIAGNOSIS — C4362 Malignant melanoma of left upper limb, including shoulder: Secondary | ICD-10-CM | POA: Diagnosis not present

## 2017-09-10 DIAGNOSIS — Z09 Encounter for follow-up examination after completed treatment for conditions other than malignant neoplasm: Secondary | ICD-10-CM | POA: Diagnosis not present

## 2017-09-15 ENCOUNTER — Other Ambulatory Visit: Payer: Self-pay | Admitting: *Deleted

## 2017-09-15 ENCOUNTER — Other Ambulatory Visit: Payer: Self-pay | Admitting: Internal Medicine

## 2017-09-15 MED ORDER — PANTOPRAZOLE SODIUM 40 MG PO TBEC: 40.0000 mg | DELAYED_RELEASE_TABLET | Freq: Every day | ORAL | 1 refills | Status: DC

## 2017-09-15 NOTE — Telephone Encounter (Signed)
rx request 

## 2017-09-15 NOTE — Telephone Encounter (Signed)
Copied from [place]. Topic: General - Other >> Sep 15, 2017 12:25 PM Darl Householder, RMA wrote: Reason for CRM: Medication refill request for pantoprazole (PROTONIX) 40 MG tablet and rOPINIRole (REQUIP) 1 MG tablet to be sent to [facility] [address]

## 2017-09-15 NOTE — Telephone Encounter (Signed)
Rx for Protonix refilled per protocol.  Request for historical medication:  Requip 1 mg  LOV: 08/11/17  PCP: [place]: verified

## 2017-09-16 NOTE — Telephone Encounter (Signed)
Okay to refill? Pt was seen last month. Medication is listed as historical. Last prescribed by Dr. Bary Castilla.

## 2017-09-17 NOTE — Telephone Encounter (Signed)
Please clarify with pt how she is taking the medication and that she is taking regularly.

## 2017-09-18 ENCOUNTER — Other Ambulatory Visit: Payer: Self-pay

## 2017-09-18 MED ORDER — PANTOPRAZOLE SODIUM 40 MG PO TBEC: 40.0000 mg | DELAYED_RELEASE_TABLET | Freq: Every day | ORAL | 0 refills | Status: DC

## 2017-09-18 NOTE — Telephone Encounter (Signed)
LMTCB

## 2017-09-19 ENCOUNTER — Encounter: Payer: Self-pay | Admitting: *Deleted

## 2017-09-19 DIAGNOSIS — N6099 Unspecified benign mammary dysplasia of unspecified breast: Secondary | ICD-10-CM | POA: Diagnosis not present

## 2017-09-19 MED ORDER — ROPINIROLE HCL 1 MG PO TABS: 1.0000 mg | ORAL_TABLET | Freq: Every day | ORAL | 3 refills | Status: DC

## 2017-09-30 ENCOUNTER — Other Ambulatory Visit: Payer: Self-pay | Admitting: Internal Medicine

## 2017-09-30 NOTE — Telephone Encounter (Unsigned)
Copied from [place] [id number]. Topic: Quick Communication - See Telephone Encounter >> Sep 30, 2017  2:09 PM Hewitt Shorts wrote: CRM for notification. See Telephone encounter for: 09/30/17.pt is requesting a refill on her klonopin and for [facility] pt  states that it may have already been reviewed but nothing has been in sys  Best number [phone]  [facility] [address] [phone]

## 2017-10-03 NOTE — Telephone Encounter (Signed)
clonazepam refill Last OV: 07/09/17 Last Refill:07/28/17 #30 1 RF Pharmacy:[facility] [address]  Einar Pheasant MD

## 2017-10-03 NOTE — Telephone Encounter (Signed)
Last filled on 07-28-17 Last office visit 08-11-17

## 2017-10-03 NOTE — Telephone Encounter (Signed)
Pt called to check status of refill, clonazePAM (KLONOPIN) 0.5 MG tablet [[id number]]  pt states the pharmacy has sent a request on the 15th and pt called on the 23rd and hasnt gotten a response, contact pt to advise

## 2017-10-06 ENCOUNTER — Other Ambulatory Visit: Payer: Self-pay

## 2017-10-06 ENCOUNTER — Encounter: Payer: Self-pay | Admitting: Internal Medicine

## 2017-10-06 MED ORDER — CLONAZEPAM 0.5 MG PO TABS: 0.5000 mg | ORAL_TABLET | Freq: Every day | ORAL | 1 refills | Status: DC

## 2017-10-06 NOTE — Progress Notes (Signed)
Last OV 08/11/17 Next OV 10/17/17 Last refill 07/28/17   rx ok'd for clonazepam #30 with one refill.  rx signed and placed in box.

## 2017-10-06 NOTE — Progress Notes (Signed)
Faxed

## 2017-10-06 NOTE — Progress Notes (Signed)
rx ok'd for clonazepam #30 with one refill.  rx signed and placed in box.

## 2017-10-06 NOTE — Telephone Encounter (Signed)
rx refilled.

## 2017-10-07 ENCOUNTER — Telehealth: Payer: Self-pay | Admitting: Internal Medicine

## 2017-10-07 NOTE — Telephone Encounter (Signed)
Request for refill of potassium chloride (K-DUR, KLOR-CON) 10 MEQ, previously filled by historical provider.   LOV: 08/11/17 Dr. Nicki Reaper  [facility] [address] in [place]

## 2017-10-07 NOTE — Telephone Encounter (Signed)
Copied from [place] [id number]. Topic: Quick Communication - Rx Refill/Question >> Oct 07, 2017 10:03 AM Yvette Rack wrote: Medication: potassium chloride (K-DUR,KLOR-CON) 10 MEQ tablet  Has the patient contacted their pharmacy? Yes.   (Agent: If no, request that the patient contact the pharmacy for the refill.) Preferred Pharmacy (with phone number or street name): [id number] - [name], [place] AT [place] [address] [place] [address] Phone: [phone] Fax: [phone]  Agent: Please be advised that RX refills may take up to 3 business days. We ask that you follow-up with your pharmacy.

## 2017-10-07 NOTE — Telephone Encounter (Signed)
Called patient to get more info, unable to leave message due to v/m full

## 2017-10-13 ENCOUNTER — Other Ambulatory Visit: Payer: Self-pay

## 2017-10-13 ENCOUNTER — Encounter: Payer: Self-pay | Admitting: Internal Medicine

## 2017-10-13 MED ORDER — POTASSIUM CHLORIDE CRYS ER 10 MEQ PO TBCR: 10.0000 meq | EXTENDED_RELEASE_TABLET | Freq: Two times a day (BID) | ORAL | 1 refills | Status: DC

## 2017-10-13 NOTE — Telephone Encounter (Signed)
Mychart message sent.

## 2017-10-17 ENCOUNTER — Ambulatory Visit (INDEPENDENT_AMBULATORY_CARE_PROVIDER_SITE_OTHER): Payer: PPO | Admitting: Internal Medicine

## 2017-10-17 VITALS — BP 138/80 | HR 62 | Temp 98.5°F | Resp 16 | Wt 188.2 lb

## 2017-10-17 DIAGNOSIS — I10 Essential (primary) hypertension: Secondary | ICD-10-CM

## 2017-10-17 DIAGNOSIS — N6099 Unspecified benign mammary dysplasia of unspecified breast: Secondary | ICD-10-CM | POA: Diagnosis not present

## 2017-10-17 DIAGNOSIS — E7800 Pure hypercholesterolemia, unspecified: Secondary | ICD-10-CM

## 2017-10-17 DIAGNOSIS — K219 Gastro-esophageal reflux disease without esophagitis: Secondary | ICD-10-CM | POA: Diagnosis not present

## 2017-10-17 DIAGNOSIS — L03119 Cellulitis of unspecified part of limb: Secondary | ICD-10-CM

## 2017-10-17 MED ORDER — CEPHALEXIN 500 MG PO CAPS
500.0000 mg | ORAL_CAPSULE | Freq: Three times a day (TID) | ORAL | 0 refills | Status: DC
Start: 2017-10-17 — End: 2017-11-11

## 2017-10-17 NOTE — Progress Notes (Signed)
Patient ID: Melissa Rhodes, female   DOB: 1951/05/27, 67 y.o.   MRN: [id number]   Subjective:    Patient ID: Melissa Rhodes, female    DOB: 04-Jan-1951, 67 y.o.   MRN: [id number]  HPI  Patient here for a scheduled follow up.  She reports she is doing relatively well.  Just recently diagnosed with melanoma.  Lesion removed from left arm.  Due to f/u with Dr Evorn Gong in 6 months.  No chest pain.  No sob.  She is on protonix.  Controls her acid reflux.  When stops the medication, symptoms return.  States has never had EGD.  Has colonoscopy scheduled next month.  Will d/w Dr Bary Castilla if EGD warranted.  Blood pressure has been averaging 120s/70s.  She saw oncology - 09/2017.  On anastrozole.  Tolerating.  S/p uterine biopsy for vaginal bleeding.  Per note, biopsy ok.  She has noticed increased redness - lower leg.  Increased warmth.     Past Medical History:  Diagnosis Date  . Arthritis 2009  . 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 Cancer [facility]) 2005   colon  . GERD (gastroesophageal reflux disease)   . Hypertension 2010  . Mammographic microcalcification 2013  . Mitral valve prolapse 2006  . 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 Obesity, unspecified 2014  . Personal history of colonic polyps 2013  . Special screening for malignant neoplasms, colon 2014  . Tubal pregnancy    Past Surgical History:  Procedure Laterality Date  . BREAST BIOPSY Left 2013  . BREAST BIOPSY Right 03/06/2017   LIQ coil clip. FEATURES OF RADIAL SCAR  . BREAST SURGERY Left 2013   re excision of left breast showed small area of residual ADH, but she was not upsatged to DCIS or invasive cancer. The margins were clear.  Patient declined tamoxifen therapy  . COLONOSCOPY  [id number]  . COLONOSCOPY W/ POLYPECTOMY  2014  . ECTOPIC PREGNANCY SURGERY  1981  . kidney stones  2009  . SHOULDER SURGERY Right 2007  . TUBAL LIGATION  1981   Family History  Problem Relation Age of Onset  .  Cancer Father 77       lung  . Early death Father   . Arthritis Mother   . Depression Mother   . Diabetes Mother   . Heart disease Mother   . Hypertension Mother   . Hyperlipidemia Mother   . Stroke Mother   . Depression Sister   . Diabetes Sister   . Hyperlipidemia Sister   . Hypertension Sister   . Depression Sister   . Hyperlipidemia Sister   . Hypertension Sister   . Miscarriages / Stillbirths Sister   . Depression Sister   . Hyperlipidemia Sister   . Hypertension Sister   . Depression Brother   . Diabetes Brother   . Hyperlipidemia Brother   . Hypertension Brother   . Stroke Brother   . Breast cancer Neg Hx   . Ovarian cancer Neg Hx    Social History   Socioeconomic History  . Marital status: Married    Spouse name: Not on file  . Number of children: Not on file  . Years of education: Not on file  . Highest education level: Not on file  Occupational History  . Not on file  Social Needs  . Financial resource strain: Not on file  . Food insecurity:    Worry: Not on file  Inability: Not on file  . Transportation needs:    Medical: Not on file    Non-medical: Not on file  Tobacco Use  . Smoking status: Never Smoker  . Smokeless tobacco: Never Used  Substance and Sexual Activity  . Alcohol use: No  . Drug use: No  . Sexual activity: Never  Lifestyle  . Physical activity:    Days per week: Not on file    Minutes per session: Not on file  . Stress: Not on file  Relationships  . Social connections:    Talks on phone: Not on file    Gets together: Not on file    Attends religious service: Not on file    Active member of club or organization: Not on file    Attends meetings of clubs or organizations: Not on file    Relationship status: Not on file  Other Topics Concern  . Not on file  Social History Narrative  . Not on file    Outpatient Encounter Medications as of 10/17/2017  Medication Sig  . anastrozole (ARIMIDEX) 1 MG tablet   . aspirin 81  MG tablet   . cephALEXin (KEFLEX) 500 MG capsule Take 1 capsule (500 mg total) by mouth 3 (three) times daily.  . clonazePAM (KLONOPIN) 0.5 MG tablet Take 1 tablet (0.5 mg total) by mouth daily.  . fluocinonide cream (LIDEX) 0.05 % Apply topically.  . hydrochlorothiazide (HYDRODIURIL) 25 MG tablet Take 25 mg by mouth daily.  Marland Kitchen lisinopril (PRINIVIL,ZESTRIL) 10 MG tablet Take 1 tablet (10 mg total) by mouth daily.  . pantoprazole (PROTONIX) 40 MG tablet Take 1 tablet (40 mg total) by mouth daily. Take 30 minutes before your evening meal  . potassium chloride (K-DUR,KLOR-CON) 10 MEQ tablet Take 1 tablet (10 mEq total) by mouth 2 (two) times daily.  Marland Kitchen rOPINIRole (REQUIP) 1 MG tablet Take 1 tablet (1 mg total) by mouth at bedtime.   No facility-administered encounter medications on file as of 10/17/2017.     Review of Systems  Constitutional: Negative for appetite change and unexpected weight change.  HENT: Negative for congestion and sinus pressure.   Respiratory: Negative for cough, chest tightness and shortness of breath.   Cardiovascular: Negative for chest pain, palpitations and leg swelling.  Gastrointestinal: Negative for abdominal pain, diarrhea, nausea and vomiting.  Genitourinary: Negative for difficulty urinating and dysuria.  Musculoskeletal: Negative for joint swelling and myalgias.  Skin: Negative for color change and rash.  Neurological: Negative for dizziness, light-headedness and headaches.  Psychiatric/Behavioral: Negative for agitation and dysphoric mood.       Objective:    Physical Exam  Constitutional: She appears well-developed and well-nourished. No distress.  HENT:  Nose: Nose normal.  Mouth/Throat: Oropharynx is clear and moist.  Neck: Neck supple. No thyromegaly present.  Cardiovascular: Normal rate and regular rhythm.  Pulmonary/Chest: Breath sounds normal. No respiratory distress. She has no wheezes.  Abdominal: Soft. Bowel sounds are normal. There is no  tenderness.  Musculoskeletal: She exhibits no edema or tenderness.  Lymphadenopathy:    She has no cervical adenopathy.  Skin: No rash noted. No erythema.  Psychiatric: She has a normal mood and affect. Her behavior is normal.    BP 138/80 (BP Location: Left Arm, Patient Position: Sitting, Cuff Size: Large)   Pulse 62   Temp 98.5 F (36.9 C) (Oral)   Resp 16   Wt 188 lb 3.2 oz (85.4 kg)   SpO2 98%   BMI 32.30 kg/m  Wt  Readings from Last 3 Encounters:  10/17/17 188 lb 3.2 oz (85.4 kg)  08/11/17 186 lb 3.2 oz (84.5 kg)  08/06/17 186 lb (84.4 kg)     Lab Results  Component Value Date   WBC 3.9 (L) 07/09/2017   HGB 14.7 07/09/2017   HCT 42.9 07/09/2017   PLT 177.0 07/09/2017   GLUCOSE 117 (H) 07/22/2017   CHOL 183 07/09/2017   TRIG 75.0 07/09/2017   HDL 47.80 07/09/2017   LDLCALC 120 (H) 07/09/2017   ALT 25 07/09/2017   AST 15 07/09/2017   NA 142 07/22/2017   K 3.7 08/01/2017   CL 106 07/22/2017   CREATININE 0.75 07/22/2017   BUN 23 07/22/2017   CO2 30 07/22/2017   TSH 4.12 07/09/2017    Mm Clip Placement Right  Result Date: 03/06/2017 CLINICAL DATA:  Status post tomosynthesis guided right breast biopsy EXAM: 3D DIAGNOSTIC RIGHT MAMMOGRAM POST STEREOTACTIC BIOPSY COMPARISON:  Previous exam(s). FINDINGS: 3D Mammographic images were obtained following tomosynthesis guided biopsy of an indeterminate right breast distortion. Post biopsy mammogram demonstrates the coil shaped biopsy marker to be in the expected location in the lower, slightly inner right breast. IMPRESSION: Appropriate marker position as above. Final Assessment: Post Procedure Mammograms for Marker Placement Electronically Signed   By: Pamelia Hoit M.D.   On: 03/06/2017 08:45   Mm Rt Breast Bx W Loc Dev 1st Lesion Image Bx Spec Stereo Guide  Addendum Date: 03/10/2017   ADDENDUM REPORT: 03/07/2017 15:41 ADDENDUM: Pathology of the right breast biopsy revealed A. RIGHT BREAST, LOWER INNER QUADRANT; STEREOTACTIC  BIOPSY: FEATURES OF RADIAL SCAR. NEGATIVE FOR ATYPIA AND MALIGNANCY. This was found to be concordant with Dr. Raul Del impression and notes. Recommendation: Surgical consultation for excision. The patient has a follow-up appointment with Dr. Bary Castilla on 03/12/17 at 4:30 PM. At the patient's request, results and recommendations were relayed to the patient by phone by Jetta Lout, [place] on 03/07/17. The patient stated she has had a skin sensitivity to the bandage. She removed the bandage and applied a clean bandage with paper tape. Post biopsy instructions were reviewed. All of her questions were answered. She was encouraged to contact the [facility] with any further questions or concerns. Addendum by Jetta Lout, RRA on 03/07/17. Electronically Signed   By: Pamelia Hoit M.D.   On: 03/07/2017 15:41   Result Date: 03/10/2017 CLINICAL DATA:  67 year old female for tomosynthesis guided biopsy of an indeterminate right breast distortion EXAM: RIGHT BREAST STEREOTACTIC CORE NEEDLE BIOPSY COMPARISON:  Previous exams. FINDINGS: The patient and I discussed the procedure of stereotactic/ tomosynthesis -guided biopsy including benefits and alternatives. We discussed the high likelihood of a successful procedure. We discussed the risks of the procedure including infection, bleeding, tissue injury, clip migration, and inadequate sampling. Informed written consent was given. The usual time out protocol was performed immediately prior to the procedure. Using sterile technique and 1% Lidocaine as local anesthetic, under stereotactic guidance, a 9 gauge vacuum assisted device was used to perform core needle biopsy of a distortion in the lower, slightly inner right breast using a medial to lateral approach. Specimen radiograph was performed. Lesion quadrant: Lower inner quadrant At the conclusion of the procedure, a coil shaped tissue marker clip was deployed into the biopsy cavity. Follow-up 2-view mammogram was performed and  dictated separately. IMPRESSION: Stereotactic/ tomosynthesis -guided biopsy of an indeterminate right breast distortion. No apparent complications. Electronically Signed: By: Pamelia Hoit M.D. On: 03/06/2017 08:44  Assessment & Plan:   Problem List Items Addressed This Visit    Atypical ductal hyperplasia of breast    On arimidex.  Continues to f/u with oncology.        GERD (gastroesophageal reflux disease)    On protonix now.  States when off medication, symptoms return.  Planning for colonoscopy 11/11/17.  D/w Dr Bary Castilla whether or not EGD warranted.        Hypercholesterolemia    Low cholesterol diet and exercise.  Follow lipid panel.        Relevant Orders   Hepatic function panel   Lipid panel   Hypertension    Blood pressure under good control.  Continue same medication regimen.  Follow pressures.  Follow metabolic panel.        Relevant Orders   CBC with Differential/Platelet   Basic metabolic panel    Other Visit Diagnoses    Cellulitis of lower extremity, unspecified laterality    -  Primary   Treat with keflex.  probiotic as directed.         Einar Pheasant, MD

## 2017-10-17 NOTE — Patient Instructions (Signed)
Take a protiotic daily while you are on the antibiotic and for two weeks after completing the antibiotic.    Examples of probiotics:  Florastor, culturelle or align

## 2017-10-19 ENCOUNTER — Encounter: Payer: Self-pay | Admitting: Internal Medicine

## 2017-10-19 NOTE — Assessment & Plan Note (Signed)
On arimidex.  Continues to f/u with oncology.

## 2017-10-19 NOTE — Assessment & Plan Note (Signed)
Low cholesterol diet and exercise.  Follow lipid panel.   

## 2017-10-19 NOTE — Assessment & Plan Note (Signed)
On protonix now.  States when off medication, symptoms return.  Planning for colonoscopy 11/11/17.  D/w Dr Bary Castilla whether or not EGD warranted.

## 2017-10-19 NOTE — Assessment & Plan Note (Signed)
Blood pressure under good control.  Continue same medication regimen.  Follow pressures.  Follow metabolic panel.   

## 2017-11-11 ENCOUNTER — Encounter: Payer: Self-pay | Admitting: General Surgery

## 2017-11-11 ENCOUNTER — Ambulatory Visit (INDEPENDENT_AMBULATORY_CARE_PROVIDER_SITE_OTHER): Payer: PPO | Admitting: General Surgery

## 2017-11-11 VITALS — BP 132/74 | HR 82 | Resp 14 | Ht 64.0 in | Wt 189.0 lb

## 2017-11-11 DIAGNOSIS — Z86010 Personal history of colonic polyps: Secondary | ICD-10-CM | POA: Diagnosis not present

## 2017-11-11 NOTE — Patient Instructions (Addendum)
Colonoscopy, Adult A colonoscopy is an exam to look at the entire large intestine. During the exam, a lubricated, bendable tube is inserted into the anus and then passed into the rectum, colon, and other parts of the large intestine. A colonoscopy is often done as a part of normal colorectal screening or in response to certain symptoms, such as anemia, persistent diarrhea, abdominal pain, and blood in the stool. The exam can help screen for and diagnose medical problems, including:  Tumors.  Polyps.  Inflammation.  Areas of bleeding.  Tell a health care provider about:  Any allergies you have.  All medicines you are taking, including vitamins, herbs, eye drops, creams, and over-the-counter medicines.  Any problems you or family members have had with anesthetic medicines.  Any blood disorders you have.  Any surgeries you have had.  Any medical conditions you have.  Any problems you have had passing stool. What are the risks? Generally, this is a safe procedure. However, problems may occur, including:  Bleeding.  A tear in the intestine.  A reaction to medicines given during the exam.  Infection (rare).  What happens before the procedure? Eating and drinking restrictions Follow instructions from your health care provider about eating and drinking, which may include:  A few days before the procedure - follow a low-fiber diet. Avoid nuts, seeds, dried fruit, raw fruits, and vegetables.  1-3 days before the procedure - follow a clear liquid diet. Drink only clear liquids, such as clear broth or bouillon, black coffee or tea, clear juice, clear soft drinks or sports drinks, gelatin dessert, and popsicles. Avoid any liquids that contain red or purple dye.  On the day of the procedure - do not eat or drink anything during the 2 hours before the procedure, or within the time period that your health care provider recommends.  Bowel prep If you were prescribed an oral bowel prep  to clean out your colon:  Take it as told by your health care provider. Starting the day before your procedure, you will need to drink a large amount of medicated liquid. The liquid will cause you to have multiple loose stools until your stool is almost clear or light green.  If your skin or anus gets irritated from diarrhea, you may use these to relieve the irritation: ? Medicated wipes, such as adult wet wipes with aloe and vitamin E. ? A skin soothing-product like petroleum jelly.  If you vomit while drinking the bowel prep, take a break for up to 60 minutes and then begin the bowel prep again. If vomiting continues and you cannot take the bowel prep without vomiting, call your health care provider.  General instructions  Ask your health care provider about changing or stopping your regular medicines. This is especially important if you are taking diabetes medicines or blood thinners.  Plan to have someone take you home from the hospital or clinic. What happens during the procedure?  An IV tube may be inserted into one of your veins.  You will be given medicine to help you relax (sedative).  To reduce your risk of infection: ? Your health care team will wash or sanitize their hands. ? Your anal area will be washed with soap.  You will be asked to lie on your side with your knees bent.  Your health care provider will lubricate a long, thin, flexible tube. The tube will have a camera and a light on the end.  The tube will be inserted into your   anus.  The tube will be gently eased through your rectum and colon.  Air will be delivered into your colon to keep it open. You may feel some pressure or cramping.  The camera will be used to take images during the procedure.  A small tissue sample may be removed from your body to be examined under a microscope (biopsy). If any potential problems are found, the tissue will be sent to a lab for testing.  If small polyps are found, your  health care provider may remove them and have them checked for cancer cells.  The tube that was inserted into your anus will be slowly removed. The procedure may vary among health care providers and hospitals. What happens after the procedure?  Your blood pressure, heart rate, breathing rate, and blood oxygen level will be monitored until the medicines you were given have worn off.  Do not drive for 24 hours after the exam.  You may have a small amount of blood in your stool.  You may pass gas and have mild abdominal cramping or bloating due to the air that was used to inflate your colon during the exam.  It is up to you to get the results of your procedure. Ask your health care provider, or the department performing the procedure, when your results will be ready. This information is not intended to replace advice given to you by your health care provider. Make sure you discuss any questions you have with your health care provider. Document Released: 05/24/2000 Document Revised: 03/27/2016 Document Reviewed: 08/08/2015 Elsevier Interactive Patient Education  2018 [employer]    Upper Endoscopy Upper endoscopy is a procedure to look inside the upper GI (gastrointestinal) tract. The upper GI tract is made up of:  The tube that carries food and liquid from your throat to your stomach (esophagus).  The stomach.  The first part of your small intestine (duodenum).  This procedure is also called esophagogastroduodenoscopy (EGD) or gastroscopy. In this procedure, your health care provider passes a thin, flexible tube (endoscope) through your mouth and down your esophagus into your stomach. A small camera is attached to the end of the tube. Images from the camera appear on a monitor in the exam room. During this procedure, your health care provider may also remove a small piece of tissue to be sent to a lab and examined under a microscope (biopsy). Your health care provider may do an upper  endoscopy to diagnose cancers of the upper GI tract. You may also have this procedure to find the cause of other conditions, such as:  Stomach pain.  Heartburn.  Pain or problems when swallowing.  Nausea and vomiting.  Stomach bleeding.  Stomach ulcers.  Tell a health care provider about:  Any allergies you have.  All medicines you are taking, including vitamins, herbs, eye drops, creams, and over-the-counter medicines.  Any problems you or family members have had with anesthetic medicines.  Any blood disorders you have.  Any surgeries you have had.  Any medical conditions you have.  Whether you are pregnant or may be pregnant. What are the risks? Generally, this is a safe procedure. However, problems may occur, including:  Infection.  Bleeding.  Allergic reactions to medicines.  A tear or hole (perforation) in the esophagus, stomach, or duodenum.  What happens before the procedure?  Follow instructions from your health care provider about eating or drinking restrictions.  Ask your health care provider about changing or stopping your regular medicines. This   is especially important if you are taking diabetes medicines or blood thinners.  Plan to have someone take you home after the procedure.  If you go home right after the procedure, plan to have someone with you for 24 hours. What happens during the procedure?  An IV tube will be inserted into one of your veins.  Your throat may be sprayed with medicine that numbs the area (local anesthetic).  You may be given a medicine to help you relax (sedative).  You will lie on your left side.  Your health care provider will pass the endoscope through your mouth and down your esophagus.  Your provider will use the scope to check the inside of your esophagus, stomach, and duodenum. Biopsies may be taken. The procedure may vary among health care providers and hospitals. What happens after the procedure?  Do not  drive for 24 hours if you received a sedative.  Your blood pressure, heart rate, breathing rate, and blood oxygen level will be monitored often until the medicines you were given have worn off.  When your throat is no longer numb, you may be given some fluids to drink.  It is your responsibility to get the results of your procedure. Ask your health care provider or the department performing the procedure when your results will be ready. This information is not intended to replace advice given to you by your health care provider. Make sure you discuss any questions you have with your health care provider. Document Released: 05/24/2000 Document Revised: 11/07/2015 Document Reviewed: 03/09/2015 Elsevier Interactive Patient Education  2018 [employer]  

## 2017-11-11 NOTE — Progress Notes (Signed)
Patient ID: Melissa Rhodes, female   DOB: Jul 29, 1950, 67 y.o.   MRN: [id number]  Chief Complaint  Patient presents with  . Colonoscopy    HPI Melissa Rhodes is a 67 y.o. female here today for a evaluation of a colonoscopy. Last colonoscopy  11/18/2012. Patient states she moves her bowels daily. No GI problems at this time. She has been taking acid reflux medication for many years, she normally does not have much in the way of symptoms, but did have bad reflux the other night.  HPI  Past Medical History:  Diagnosis Date  . Arthritis 2009  . 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 Cancer [facility]) 2005   colon  . GERD (gastroesophageal reflux disease)   . Hypertension 2010  . Mammographic microcalcification 2013  . Melanoma ([place]) 2019   left arm  . Mitral valve prolapse 2006  . 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 Obesity, unspecified 2014  . Personal history of colonic polyps 2013  . Special screening for malignant neoplasms, colon 2014  . Tubal pregnancy     Past Surgical History:  Procedure Laterality Date  . BREAST BIOPSY Left 2013  . BREAST BIOPSY Right 03/06/2017   LIQ coil clip. FEATURES OF RADIAL SCAR  . BREAST SURGERY Left 2013   re excision of left breast showed small area of residual ADH, but she was not upsatged to DCIS or invasive cancer. The margins were clear.  Patient declined tamoxifen therapy  . COLONOSCOPY  [id number]  . COLONOSCOPY W/ POLYPECTOMY  2014  . ECTOPIC PREGNANCY SURGERY  1981  . kidney stones  2009  . SHOULDER SURGERY Right 2007  . TUBAL LIGATION  1981    Family History  Problem Relation Age of Onset  . Cancer Father 38       lung  . Early death Father   . Arthritis Mother   . Depression Mother   . Diabetes Mother   . Heart disease Mother   . Hypertension Mother   . Hyperlipidemia Mother   . Stroke Mother   . Depression Sister   . Diabetes Sister   . Hyperlipidemia Sister   . Hypertension Sister    . Depression Sister   . Hyperlipidemia Sister   . Hypertension Sister   . Miscarriages / Stillbirths Sister   . Depression Sister   . Hyperlipidemia Sister   . Hypertension Sister   . Depression Brother   . Diabetes Brother   . Hyperlipidemia Brother   . Hypertension Brother   . Stroke Brother   . Breast cancer Neg Hx   . Ovarian cancer Neg Hx     Social History Social History   Tobacco Use  . Smoking status: Never Smoker  . Smokeless tobacco: Never Used  Substance Use Topics  . Alcohol use: No  . Drug use: No    No Known Allergies  Current Outpatient Medications  Medication Sig Dispense Refill  . anastrozole (ARIMIDEX) 1 MG tablet     . aspirin 81 MG tablet     . clonazePAM (KLONOPIN) 0.5 MG tablet Take 1 tablet (0.5 mg total) by mouth daily. 30 tablet 1  . fluocinonide cream (LIDEX) 0.05 % Apply topically.    . hydrochlorothiazide (HYDRODIURIL) 25 MG tablet Take 25 mg by mouth daily.    Marland Kitchen lisinopril (PRINIVIL,ZESTRIL) 10 MG tablet Take 1 tablet (10 mg total) by mouth daily. 90 tablet 0  . pantoprazole (PROTONIX)  40 MG tablet Take 1 tablet (40 mg total) by mouth daily. Take 30 minutes before your evening meal 90 tablet 0  . potassium chloride (K-DUR,KLOR-CON) 10 MEQ tablet Take 1 tablet (10 mEq total) by mouth 2 (two) times daily. 180 tablet 1  . rOPINIRole (REQUIP) 1 MG tablet Take 1 tablet (1 mg total) by mouth at bedtime. 30 tablet 3   No current facility-administered medications for this visit.     Review of Systems Review of Systems  Constitutional: Negative.   Respiratory: Negative.   Cardiovascular: Negative.   Gastrointestinal: Negative.        Reflux symptoms, especially with missed doses of PPI.  No hematemesis or dysphasia.    Blood pressure 132/74, pulse 82, resp. rate 14, height 5\' 4"  (1.626 m), weight 189 lb (85.7 kg).  Physical Exam Physical Exam  Constitutional: She is oriented to person, place, and time. She appears well-developed and  well-nourished.  Eyes: Conjunctivae are normal. No scleral icterus.  Neck: Neck supple.  Cardiovascular: Normal rate, regular rhythm and normal heart sounds.  Pulmonary/Chest: Effort normal and breath sounds normal.  Lymphadenopathy:    She has no cervical adenopathy.  Neurological: She is alert and oriented to person, place, and time.  Skin: Skin is warm and dry.  Psychiatric: She has a normal mood and affect.    Data Reviewed 2004 colonoscopy showed a suggestion of in situ malignancy.  2006 colonoscopy showed a adenomatous polyp.  Her study in 2014 showed inflammatory polyp.  Assessment    Candidate for follow-up colonoscopy.  Persistent reflux symptoms warranted screening upper endoscopy.    Plan  Colonoscopy and upper endoscpoy with possible biopsy/polypectomy prn: Information regarding the procedure, including its potential risks and complications (including but not limited to perforation of the bowel, which may require emergency surgery to repair, and bleeding) was verbally given to the patient. Educational information regarding lower intestinal endoscopy was given to the patient. Written instructions for how to complete the bowel prep using Miralax were provided. The importance of drinking ample fluids to avoid dehydration as a result of the prep emphasized.  HPI, Physical Exam, Assessment and Plan have been scribed under the direction and in the presence of Robert Bellow, MD  Concepcion Living, LPN  I have completed the exam and reviewed the above documentation for accuracy and completeness.  I agree with the above.  Haematologist has been used and any errors in dictation or transcription are unintentional.  Hervey Ard, M.D., F.A.C.S.  Forest Gleason Maleni Seyer 11/11/2017, 9:21 PM

## 2017-11-12 ENCOUNTER — Encounter: Payer: Self-pay | Admitting: *Deleted

## 2017-11-12 MED ORDER — POLYETHYLENE GLYCOL 3350 17 GM/SCOOP PO POWD: ORAL | 0 refills | Status: DC

## 2017-11-12 NOTE — Progress Notes (Signed)
Patient has been scheduled for a colonoscopy and upper endoscopy on 11-19-17 at [facility]. Miralax prescription has been sent in to the patient's pharmacy today. Colonoscopy instructions were provided to the patient at time of her office visit yesterday. It is okay for patient to continue an 81 mg aspirin once daily.

## 2017-11-13 ENCOUNTER — Telehealth: Payer: Self-pay | Admitting: *Deleted

## 2017-11-13 NOTE — Telephone Encounter (Signed)
Patient received your call and she stated that she does not get nauseous and does not need a patch.

## 2017-11-18 ENCOUNTER — Encounter: Payer: Self-pay | Admitting: *Deleted

## 2017-11-19 ENCOUNTER — Ambulatory Visit: Payer: PPO | Admitting: Anesthesiology

## 2017-11-19 ENCOUNTER — Encounter
Admission: RE | Disposition: A | Discharge status: Home / Self Care | Payer: Self-pay | Source: Ambulatory Visit | Attending: General Surgery

## 2017-11-19 ENCOUNTER — Encounter: Payer: Self-pay | Admitting: Anesthesiology

## 2017-11-19 ENCOUNTER — Ambulatory Visit
Admission: RE | Admit: 2017-11-19 | Discharge: 2017-11-19 | Disposition: A | Discharge status: Home / Self Care | Payer: PPO | Source: Ambulatory Visit | Attending: General Surgery | Admitting: General Surgery

## 2017-11-19 DIAGNOSIS — Z7982 Long term (current) use of aspirin: Secondary | ICD-10-CM | POA: Diagnosis not present

## 2017-11-19 DIAGNOSIS — K449 Diaphragmatic hernia without obstruction or gangrene: Secondary | ICD-10-CM | POA: Insufficient documentation

## 2017-11-19 DIAGNOSIS — I10 Essential (primary) hypertension: Secondary | ICD-10-CM | POA: Diagnosis not present

## 2017-11-19 DIAGNOSIS — K5792 Diverticulitis of intestine, part unspecified, without perforation or abscess without bleeding: Secondary | ICD-10-CM | POA: Diagnosis not present

## 2017-11-19 DIAGNOSIS — Z800 Family history of malignant neoplasm of digestive organs: Secondary | ICD-10-CM | POA: Diagnosis not present

## 2017-11-19 DIAGNOSIS — K5730 Diverticulosis of large intestine without perforation or abscess without bleeding: Secondary | ICD-10-CM | POA: Insufficient documentation

## 2017-11-19 DIAGNOSIS — K621 Rectal polyp: Secondary | ICD-10-CM | POA: Diagnosis not present

## 2017-11-19 DIAGNOSIS — Z1211 Encounter for screening for malignant neoplasm of colon: Secondary | ICD-10-CM | POA: Insufficient documentation

## 2017-11-19 DIAGNOSIS — K210 Gastro-esophageal reflux disease with esophagitis: Secondary | ICD-10-CM | POA: Insufficient documentation

## 2017-11-19 DIAGNOSIS — K209 Esophagitis, unspecified: Secondary | ICD-10-CM | POA: Diagnosis not present

## 2017-11-19 DIAGNOSIS — Z860100 Personal history of colon polyps, unspecified: Secondary | ICD-10-CM

## 2017-11-19 DIAGNOSIS — K228 Other specified diseases of esophagus: Secondary | ICD-10-CM | POA: Insufficient documentation

## 2017-11-19 DIAGNOSIS — K5733 Diverticulitis of large intestine without perforation or abscess with bleeding: Secondary | ICD-10-CM | POA: Diagnosis not present

## 2017-11-19 DIAGNOSIS — Z79899 Other long term (current) drug therapy: Secondary | ICD-10-CM | POA: Diagnosis not present

## 2017-11-19 DIAGNOSIS — Z6832 Body mass index (BMI) 32.0-32.9, adult: Secondary | ICD-10-CM | POA: Insufficient documentation

## 2017-11-19 DIAGNOSIS — K229 Disease of esophagus, unspecified: Secondary | ICD-10-CM | POA: Diagnosis not present

## 2017-11-19 DIAGNOSIS — D128 Benign neoplasm of rectum: Secondary | ICD-10-CM | POA: Insufficient documentation

## 2017-11-19 DIAGNOSIS — E669 Obesity, unspecified: Secondary | ICD-10-CM | POA: Insufficient documentation

## 2017-11-19 DIAGNOSIS — L83 Acanthosis nigricans: Secondary | ICD-10-CM | POA: Insufficient documentation

## 2017-11-19 DIAGNOSIS — Z79811 Long term (current) use of aromatase inhibitors: Secondary | ICD-10-CM | POA: Diagnosis not present

## 2017-11-19 HISTORY — PX: COLONOSCOPY WITH PROPOFOL: SHX5780

## 2017-11-19 HISTORY — PX: ESOPHAGOGASTRODUODENOSCOPY (EGD) WITH PROPOFOL: SHX5813

## 2017-11-19 SURGERY — ESOPHAGOGASTRODUODENOSCOPY (EGD) WITH PROPOFOL
Anesthesia: General

## 2017-11-19 MED ORDER — LIDOCAINE HCL (PF) 2 % IJ SOLN
INTRAMUSCULAR | Status: DC | PRN
  Administered 2017-11-19: 100 mg via INTRADERMAL

## 2017-11-19 MED ORDER — PROPOFOL 10 MG/ML IV BOLUS
INTRAVENOUS | Status: DC | PRN
  Administered 2017-11-19: 40 mg via INTRAVENOUS
  Administered 2017-11-19: 50 mg via INTRAVENOUS

## 2017-11-19 MED ORDER — PROPOFOL 500 MG/50ML IV EMUL
INTRAVENOUS | Status: DC | PRN
  Administered 2017-11-19: 150 ug/kg/min via INTRAVENOUS

## 2017-11-19 MED ORDER — SODIUM CHLORIDE 0.9 % IV SOLN
INTRAVENOUS | Status: DC
  Administered 2017-11-19: 1000 mL via INTRAVENOUS

## 2017-11-19 NOTE — Transfer of Care (Signed)
Immediate Anesthesia Transfer of Care Note  Patient: Melissa Rhodes  Procedure(s) Performed: ESOPHAGOGASTRODUODENOSCOPY (EGD) WITH PROPOFOL (N/A ) COLONOSCOPY WITH PROPOFOL (N/A )  Patient Location: PACU  Anesthesia Type:General  Level of Consciousness: sedated  Airway & Oxygen Therapy: Patient Spontanous Breathing and Patient connected to nasal cannula oxygen  Post-op Assessment: Report given to RN and Post -op Vital signs reviewed and stable  Post vital signs: Reviewed and stable  Last Vitals:  Vitals Value Taken Time  BP 131/73 11/19/2017 11:10 AM  Temp 36.2 C 11/19/2017 11:10 AM  Pulse 51 11/19/2017 11:11 AM  Resp 19 11/19/2017 11:11 AM  SpO2 100 % 11/19/2017 11:11 AM  Vitals shown include unvalidated device data.  Last Pain:  Vitals:   11/19/17 1110  TempSrc: Tympanic  PainSc:          Complications: No apparent anesthesia complications

## 2017-11-19 NOTE — Anesthesia Procedure Notes (Signed)
Date/Time: 11/19/2017 10:31 AM Performed by: Nelda Marseille, CRNA Pre-anesthesia Checklist: Patient identified, Emergency Drugs available, Suction available, Patient being monitored and Timeout performed Oxygen Delivery Method: Nasal cannula

## 2017-11-19 NOTE — Op Note (Signed)
[facility] Gastroenterology Patient Name: Melissa Rhodes Procedure Date: 11/19/2017 10:14 AM MRN: [id number] Account #: [id number] Date of Birth: 11-14-1950 Admit Type: Outpatient Age: 67 Room: [facility] ENDO ROOM 1 Gender: Female Note Status: Finalized Procedure:            Upper GI endoscopy Indications:          Suspected esophageal reflux Providers:            Robert Bellow, MD Referring MD:         Einar Pheasant, MD (Referring MD) Medicines:            Monitored Anesthesia Care Complications:        No immediate complications. Procedure:            Pre-Anesthesia Assessment:                       - Prior to the procedure, a History and Physical was                        performed, and patient medications, allergies and                        sensitivities were reviewed. The patient's tolerance of                        previous anesthesia was reviewed.                       - The risks and benefits of the procedure and the                        sedation options and risks were discussed with the                        patient. All questions were answered and informed                        consent was obtained.                       After obtaining informed consent, the endoscope was                        passed under direct vision. Throughout the procedure,                        the patient's blood pressure, pulse, and oxygen                        saturations were monitored continuously. The Endoscope                        was introduced through the mouth, and advanced to the                        third part of duodenum. The upper GI endoscopy was                        somewhat difficult due to narrowing at the  cricophyringeous muscle . The patient tolerated the                        procedure well. Findings:      LA Grade A (one or more mucosal breaks less than 5 mm, not extending       between tops of 2 mucosal folds)  esophagitis with no bleeding was found       35 cm from the incisors. Biopsies were taken with a cold forceps for       histology.      Multiple 4 mm polyps with no bleeding were found 24 to 36 cm from the       incisors. Biopsies were taken with a cold forceps for histology.      A small hiatal hernia was present.      The examined duodenum was normal. Impression:           - LA Grade A reflux esophagitis. Biopsied.                       - Esophageal polyp(s) were found. Biopsied.                       - Small hiatal hernia.                       - Normal examined duodenum. Recommendation:       - Return to endoscopist in 2 weeks.                       - Perform a colonoscopy today. Procedure Code(s):    --- Professional ---                       [phone], Esophagogastroduodenoscopy, flexible, transoral;                        with biopsy, single or multiple Diagnosis Code(s):    --- Professional ---                       K21.0, Gastro-esophageal reflux disease with esophagitis                       K22.8, Other specified diseases of esophagus                       K44.9, Diaphragmatic hernia without obstruction or                        gangrene CPT copyright 2017 American Medical Association. All rights reserved. The codes documented in this report are preliminary and upon coder review may  be revised to meet current compliance requirements. Robert Bellow, MD 11/19/2017 10:44:40 AM This report has been signed electronically. Number of Addenda: 0 Note Initiated On: 11/19/2017 10:14 AM      [facility]

## 2017-11-19 NOTE — H&P (Signed)
No change in clincal condition or exam. For upper and lower endoscopy.

## 2017-11-19 NOTE — Anesthesia Preprocedure Evaluation (Addendum)
Anesthesia Evaluation  Patient identified by MRN, date of birth, ID band Patient awake    Reviewed: Allergy & Precautions, H&P , NPO status , Patient's Chart, lab work & pertinent test results, reviewed documented beta blocker date and time   History of Anesthesia Complications (+) PONV and history of anesthetic complications  Airway Mallampati: III  TM Distance: >3 FB Neck ROM: full    Dental  (+) Caps, Dental Advidsory Given, Missing, Teeth Intact   Pulmonary neg pulmonary ROS,           Cardiovascular Exercise Tolerance: Good hypertension, (-) angina(-) CAD, (-) Past MI, (-) Cardiac Stents and (-) CABG (-) dysrhythmias + Valvular Problems/Murmurs MVP      Neuro/Psych negative neurological ROS  negative psych ROS   GI/Hepatic Neg liver ROS, GERD  ,  Endo/Other  negative endocrine ROS  Renal/GU negative Renal ROS  negative genitourinary   Musculoskeletal   Abdominal   Peds  Hematology negative hematology ROS (+)   Anesthesia Other Findings Past Medical History: 2009: Arthritis 10/2011: Breast ductal hyperplasia, atypical     Comment:  left 2005: Cancer ([place])     Comment:  colon No date: GERD (gastroesophageal reflux disease) 2010: Hypertension 2013: Mammographic microcalcification 2019: Melanoma ([place])     Comment:  left arm 2006: Mitral valve prolapse 2014: Neoplasm of uncertain behavior of breast     Comment:  left 2014: Obesity, unspecified 2013: Personal history of colonic polyps 2014: Special screening for malignant neoplasms, colon No date: Tubal pregnancy   Reproductive/Obstetrics negative OB ROS                            Anesthesia Physical Anesthesia Plan  ASA: II  Anesthesia Plan: General   Post-op Pain Management:    Induction: Intravenous  PONV Risk Score and Plan: 3 and Propofol infusion  Airway Management Planned: Nasal Cannula  Additional Equipment:    Intra-op Plan:   Post-operative Plan:   Informed Consent: I have reviewed the patients History and Physical, chart, labs and discussed the procedure including the risks, benefits and alternatives for the proposed anesthesia with the patient or authorized representative who has indicated his/her understanding and acceptance.   Dental Advisory Given  Plan Discussed with: Anesthesiologist, CRNA and Surgeon  Anesthesia Plan Comments:        Anesthesia Quick Evaluation

## 2017-11-19 NOTE — Anesthesia Post-op Follow-up Note (Signed)
Anesthesia QCDR form completed.        

## 2017-11-19 NOTE — Op Note (Signed)
[facility] Gastroenterology Patient Name: Melissa Rhodes Procedure Date: 11/19/2017 10:14 AM MRN: [id number] Account #: [id number] Date of Birth: 29-Jan-1951 Admit Type: Outpatient Age: 67 Room: [facility] ENDO ROOM 1 Gender: Female Note Status: Finalized Procedure:            Colonoscopy Indications:          Family history of colon cancer in a first-degree                        relative Providers:            Robert Bellow, MD Referring MD:         Einar Pheasant, MD (Referring MD) Medicines:            Monitored Anesthesia Care Complications:        No immediate complications. Procedure:            Pre-Anesthesia Assessment:                       - Prior to the procedure, a History and Physical was                        performed, and patient medications, allergies and                        sensitivities were reviewed. The patient's tolerance of                        previous anesthesia was reviewed.                       - The risks and benefits of the procedure and the                        sedation options and risks were discussed with the                        patient. All questions were answered and informed                        consent was obtained.                       After obtaining informed consent, the colonoscope was                        passed under direct vision. Throughout the procedure,                        the patient's blood pressure, pulse, and oxygen                        saturations were monitored continuously. The                        Colonoscope was introduced through the anus and                        advanced to the the terminal ileum. The colonoscopy was  performed without difficulty. The patient tolerated the                        procedure well. The quality of the bowel preparation                        was excellent. Findings:      A few diverticula were found in the sigmoid colon.      A 5  mm polyp was found in the rectum. The polyp was sessile. This was       biopsied with a cold forceps for histology.      The retroflexed view of the distal rectum and anal verge was normal and       showed no anal or rectal abnormalities. Impression:           - Diverticulosis in the sigmoid colon.                       - One 5 mm polyp in the rectum. Biopsied.                       - The distal rectum and anal verge are normal on                        retroflexion view. Recommendation:       - Repeat colonoscopy in 5 years for surveillance. Procedure Code(s):    --- Professional ---                       [phone], Colonoscopy, flexible; with biopsy, single or                        multiple Diagnosis Code(s):    --- Professional ---                       K62.1, Rectal polyp                       Z80.0, Family history of malignant neoplasm of                        digestive organs                       K57.30, Diverticulosis of large intestine without                        perforation or abscess without bleeding CPT copyright 2017 American Medical Association. All rights reserved. The codes documented in this report are preliminary and upon coder review may  be revised to meet current compliance requirements. Robert Bellow, MD 11/19/2017 11:09:55 AM This report has been signed electronically. Number of Addenda: 0 Note Initiated On: 11/19/2017 10:14 AM Scope Withdrawal Time: 0 hours 8 minutes 19 seconds  Total Procedure Duration: 0 hours 17 minutes 55 seconds       [facility]

## 2017-11-20 NOTE — Anesthesia Postprocedure Evaluation (Signed)
Anesthesia Post Note  Patient: Melissa Rhodes  Procedure(s) Performed: ESOPHAGOGASTRODUODENOSCOPY (EGD) WITH PROPOFOL (N/A ) COLONOSCOPY WITH PROPOFOL (N/A )  Patient location during evaluation: Endoscopy Anesthesia Type: General Level of consciousness: awake and alert Pain management: pain level controlled Vital Signs Assessment: post-procedure vital signs reviewed and stable Respiratory status: spontaneous breathing, nonlabored ventilation, respiratory function stable and patient connected to nasal cannula oxygen Cardiovascular status: blood pressure returned to baseline and stable Postop Assessment: no apparent nausea or vomiting Anesthetic complications: no     Last Vitals:  Vitals:   11/19/17 1120 11/19/17 1130  BP: 136/74 (!) 143/68  Pulse: (!) 53 (!) 54  Resp: 16 12  Temp:    SpO2: 100% 100%    Last Pain:  Vitals:   11/20/17 0800  TempSrc:   PainSc: 0-No pain                 Martha Clan

## 2017-11-21 LAB — SURGICAL PATHOLOGY

## 2017-11-22 ENCOUNTER — Other Ambulatory Visit: Payer: Self-pay

## 2017-11-22 ENCOUNTER — Encounter: Payer: Self-pay | Admitting: *Deleted

## 2017-11-22 ENCOUNTER — Emergency Department
Admission: EM | Admit: 2017-11-22 | Discharge: 2017-11-22 | Disposition: A | Discharge status: Home / Self Care | Payer: PPO | Attending: Emergency Medicine | Admitting: Emergency Medicine

## 2017-11-22 ENCOUNTER — Emergency Department: Payer: PPO

## 2017-11-22 DIAGNOSIS — I10 Essential (primary) hypertension: Secondary | ICD-10-CM | POA: Diagnosis not present

## 2017-11-22 DIAGNOSIS — R110 Nausea: Secondary | ICD-10-CM | POA: Insufficient documentation

## 2017-11-22 DIAGNOSIS — R339 Retention of urine, unspecified: Secondary | ICD-10-CM | POA: Diagnosis not present

## 2017-11-22 DIAGNOSIS — E876 Hypokalemia: Secondary | ICD-10-CM | POA: Insufficient documentation

## 2017-11-22 DIAGNOSIS — N23 Unspecified renal colic: Secondary | ICD-10-CM | POA: Diagnosis not present

## 2017-11-22 DIAGNOSIS — R109 Unspecified abdominal pain: Secondary | ICD-10-CM | POA: Diagnosis not present

## 2017-11-22 DIAGNOSIS — Z85038 Personal history of other malignant neoplasm of large intestine: Secondary | ICD-10-CM | POA: Insufficient documentation

## 2017-11-22 HISTORY — DX: Disorder of kidney and ureter, unspecified: N28.9

## 2017-11-22 LAB — BASIC METABOLIC PANEL
ANION GAP: 7 (ref 5–15)
BUN: 18 mg/dL (ref 6–20)
CHLORIDE: 109 mmol/L (ref 101–111)
CO2: 23 mmol/L (ref 22–32)
Calcium: 8.1 mg/dL — ABNORMAL LOW (ref 8.9–10.3)
Creatinine, Ser: 0.72 mg/dL (ref 0.44–1.00)
GFR calc Af Amer: >60 mL/min (ref >60)
GFR calc non Af Amer: >60 mL/min (ref >60)
GLUCOSE: 127 mg/dL — AB (ref 65–99)
POTASSIUM: 4.6 mmol/L (ref 3.5–5.1)
Sodium: 139 mmol/L (ref 135–145)

## 2017-11-22 LAB — URINALYSIS, COMPLETE (UACMP) WITH MICROSCOPIC
BACTERIA UA: NONE SEEN
Bilirubin Urine: NEGATIVE
Glucose, UA: NEGATIVE mg/dL
KETONES UR: NEGATIVE mg/dL
LEUKOCYTES UA: NEGATIVE
Nitrite: NEGATIVE
PROTEIN: NEGATIVE mg/dL
SPECIFIC GRAVITY, URINE: 1.021 (ref 1.005–1.030)
SQUAMOUS EPITHELIAL / LPF: NONE SEEN (ref 0–5)
pH: 5 (ref 5.0–8.0)

## 2017-11-22 LAB — COMPREHENSIVE METABOLIC PANEL
ALBUMIN: 4 g/dL (ref 3.5–5.0)
ALT: 20 U/L (ref 14–54)
ANION GAP: 8 (ref 5–15)
AST: 21 U/L (ref 15–41)
Alkaline Phosphatase: 54 U/L (ref 38–126)
BUN: 20 mg/dL (ref 6–20)
CHLORIDE: 106 mmol/L (ref 101–111)
CO2: 24 mmol/L (ref 22–32)
Calcium: 8.7 mg/dL — ABNORMAL LOW (ref 8.9–10.3)
Creatinine, Ser: 0.69 mg/dL (ref 0.44–1.00)
GFR calc Af Amer: >60 mL/min (ref >60)
GFR calc non Af Amer: >60 mL/min (ref >60)
GLUCOSE: 157 mg/dL — AB (ref 65–99)
POTASSIUM: 2.8 mmol/L — AB (ref 3.5–5.1)
Sodium: 138 mmol/L (ref 135–145)
Total Bilirubin: 0.7 mg/dL (ref 0.3–1.2)
Total Protein: 6.4 g/dL — ABNORMAL LOW (ref 6.5–8.1)

## 2017-11-22 LAB — CBC
HEMATOCRIT: 38.3 % (ref 35.0–47.0)
HEMOGLOBIN: 13.4 g/dL (ref 12.0–16.0)
MCH: 30 pg (ref 26.0–34.0)
MCHC: 34.9 g/dL (ref 32.0–36.0)
MCV: 86.1 fL (ref 80.0–100.0)
Platelets: 175 10*3/uL (ref 150–440)
RBC: 4.45 MIL/uL (ref 3.80–5.20)
RDW: 13.6 % (ref 11.5–14.5)
WBC: 3.7 10*3/uL (ref 3.6–11.0)

## 2017-11-22 MED ORDER — POTASSIUM CHLORIDE 10 MEQ/100ML IV SOLN
10.0000 meq | INTRAVENOUS | Status: AC
  Administered 2017-11-22 (×2): 10 meq via INTRAVENOUS
  Filled 2017-11-22 (×5): qty 100

## 2017-11-22 MED ORDER — TAMSULOSIN HCL 0.4 MG PO CAPS
0.4000 mg | ORAL_CAPSULE | Freq: Once | ORAL | Status: AC
  Administered 2017-11-22: 0.4 mg via ORAL
  Filled 2017-11-22: qty 1

## 2017-11-22 MED ORDER — OXYCODONE-ACETAMINOPHEN 5-325 MG PO TABS
1.0000 | ORAL_TABLET | Freq: Once | ORAL | Status: AC
  Administered 2017-11-22: 1 via ORAL

## 2017-11-22 MED ORDER — ONDANSETRON 4 MG PO TBDP
4.0000 mg | ORAL_TABLET | Freq: Once | ORAL | Status: AC
  Administered 2017-11-22: 4 mg via ORAL
  Filled 2017-11-22: qty 1

## 2017-11-22 MED ORDER — ONDANSETRON 4 MG PO TBDP: 4.0000 mg | ORAL_TABLET | Freq: Three times a day (TID) | ORAL | 0 refills | Status: DC | PRN

## 2017-11-22 MED ORDER — ONDANSETRON HCL 4 MG/2ML IJ SOLN
4.0000 mg | Freq: Once | INTRAMUSCULAR | Status: AC
  Administered 2017-11-22: 4 mg via INTRAVENOUS
  Filled 2017-11-22: qty 2

## 2017-11-22 MED ORDER — SODIUM CHLORIDE 0.9 % IV BOLUS
1000.0000 mL | Freq: Once | INTRAVENOUS | Status: AC
  Administered 2017-11-22: 1000 mL via INTRAVENOUS

## 2017-11-22 MED ORDER — TAMSULOSIN HCL 0.4 MG PO CAPS: 0.4000 mg | ORAL_CAPSULE | Freq: Every day | ORAL | 0 refills | Status: DC

## 2017-11-22 MED ORDER — KETOROLAC TROMETHAMINE 30 MG/ML IJ SOLN
30.0000 mg | Freq: Once | INTRAMUSCULAR | Status: AC
  Administered 2017-11-22: 30 mg via INTRAVENOUS
  Filled 2017-11-22: qty 1

## 2017-11-22 MED ORDER — OXYCODONE-ACETAMINOPHEN 5-325 MG PO TABS
1.0000 | ORAL_TABLET | ORAL | Status: DC | PRN
  Administered 2017-11-22: 1 via ORAL
  Filled 2017-11-22: qty 1

## 2017-11-22 MED ORDER — OXYCODONE-ACETAMINOPHEN 5-325 MG PO TABS
ORAL_TABLET | ORAL | Status: AC
  Filled 2017-11-22: qty 1

## 2017-11-22 MED ORDER — KETOROLAC TROMETHAMINE 10 MG PO TABS: 10.0000 mg | ORAL_TABLET | Freq: Three times a day (TID) | ORAL | 0 refills | Status: DC | PRN

## 2017-11-22 MED ORDER — POTASSIUM CHLORIDE CRYS ER 20 MEQ PO TBCR
40.0000 meq | EXTENDED_RELEASE_TABLET | Freq: Once | ORAL | Status: AC
  Administered 2017-11-22: 40 meq via ORAL
  Filled 2017-11-22: qty 2

## 2017-11-22 MED ORDER — OXYCODONE-ACETAMINOPHEN 7.5-325 MG PO TABS
ORAL_TABLET | ORAL | Status: AC
  Filled 2017-11-22: qty 1

## 2017-11-22 MED ORDER — OXYCODONE-ACETAMINOPHEN 5-325 MG PO TABS: 1.0000 | ORAL_TABLET | ORAL | 0 refills | Status: DC | PRN

## 2017-11-22 NOTE — ED Notes (Signed)
Potassium received from pharmacy at this time/ Dr Mariea Clonts aware/ sr on monitor at 60/ skin warm and dry. Pt denies pain at this time

## 2017-11-22 NOTE — ED Provider Notes (Signed)
[facility] Emergency Department Provider Note  ____________________________________________  Time seen: Approximately 7:55 AM  I have reviewed the triage vital signs and the nursing notes.   HISTORY  Chief Complaint Flank Pain    HPI DEARA BOBER is a 67 y.o. 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  The patient reports that at 3 AM she woke up with the urge to urinate, but was unable to have complete voiding.  She then developed a severe right flank pain, general and diffuse abdominal discomfort, and urinary retention.  She has had nausea without vomiting.  No fevers or chills.  She has had several episodes of loose stool overnight, and notes some mild blood streaks which are chronic for her from chronic hemorrhoids.  The patient reports that she underwent endoscopy and colonoscopy 4 days ago, and her throat has been sore so she has been unable to swallow her potassium supplementation pills since then.  Patient received Percocet and Zofran in triage, and states that her symptoms have improved.  Past Medical History:  Diagnosis Date  . Arthritis 2009  . 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 Cancer [facility]) 2005   colon  . GERD (gastroesophageal reflux disease)   . Hypertension 2010  . Mammographic microcalcification 2013  . Melanoma ([place]) 2019   left arm  . Mitral valve prolapse 2006  . 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 Obesity, unspecified 2014  . Personal history of colonic polyps 2013  . Renal disorder    kidney stones  . Special screening for malignant neoplasms, colon 2014  . Tubal pregnancy     Patient Active Problem List   Diagnosis Date Noted  . Arm lesion 08/12/2017  . Postmenopausal bleeding 08/06/2017  . Healthcare maintenance 07/28/2017  . Hypertension 07/12/2017  . GERD (gastroesophageal reflux disease)  07/12/2017  . History of colon polyps 07/12/2017  . Hypercholesterolemia 07/09/2017  . Radial scar of breast 03/12/2017  . Anal fissure 02/13/2016  . Rectal bleeding 02/13/2016  . 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 Mammographic microcalcification   . Breast ductal hyperplasia, atypical     Past Surgical History:  Procedure Laterality Date  . BREAST BIOPSY Left 2013  . BREAST BIOPSY Right 03/06/2017   LIQ coil clip. FEATURES OF RADIAL SCAR  . BREAST SURGERY Left 2013   re excision of left breast showed small area of residual ADH, but she was not upsatged to DCIS or invasive cancer. The margins were clear.  Patient declined tamoxifen therapy  . COLONOSCOPY  [id number]  . COLONOSCOPY W/ POLYPECTOMY  2014  . ECTOPIC PREGNANCY SURGERY  1981  . kidney stones  2009  . SHOULDER SURGERY Right 2007  . TUBAL LIGATION  1981    Current Outpatient Rx  . Order #: [id number] Class: Historical Med  . Order #: [id number] Class: Historical Med  . Order #: [id number] Class: Print  . Order #: [id number] Class: Historical Med  . Order #: [id number] Class: Historical Med  . Order #: [id number] Class: Normal  . Order #: [id number] Class: Normal  . Order #: [id number] Class: Normal  . Order #: [id number] Class: Normal  . Order #: [id number] Class: Print  . Order #: [id number] Class: Print  . Order #: [id number] Class: Print  . Order #: [id number] Class: Normal  . Order #: [id number] Class: Print    Allergies Patient has no known allergies.  Family History  Problem Relation Age of Onset  .  Cancer Father 42       lung  . Early death Father   . Arthritis Mother   . Depression Mother   . Diabetes Mother   . Heart disease Mother   . Hypertension Mother   . Hyperlipidemia Mother   . Stroke Mother   . Depression Sister   . Diabetes Sister   . Hyperlipidemia Sister   . Hypertension Sister   . Depression Sister   . Hyperlipidemia Sister   . Hypertension Sister   . Miscarriages / Stillbirths Sister    . Depression Sister   . Hyperlipidemia Sister   . Hypertension Sister   . Depression Brother   . Diabetes Brother   . Hyperlipidemia Brother   . Hypertension Brother   . Stroke Brother   . Breast cancer Neg Hx   . Ovarian cancer Neg Hx     Social History Social History   Tobacco Use  . Smoking status: Never Smoker  . Smokeless tobacco: Never Used  Substance Use Topics  . Alcohol use: No  . Drug use: No    Review of Systems Constitutional: No fever/chills.  No lightheadedness or syncope. Eyes: No visual changes. ENT: No sore throat. No congestion or rhinorrhea. Cardiovascular: Denies chest pain. Denies palpitations. Respiratory: Denies shortness of breath.  No cough. Gastrointestinal: Positive right flank pain.  Positive general diffuse nonfocal abdominal pain.  Positive nausea, no vomiting.  Positive diarrhea with mild chronic blood streaks.  No constipation. Genitourinary: Negative for dysuria.  Positive for urinary retention.  No hematuria. Musculoskeletal: Negative for back pain except for flank pain. Skin: Negative for rash. Neurological: Negative for headaches. No focal numbness, tingling or weakness.     ____________________________________________   PHYSICAL EXAM:  VITAL SIGNS: ED Triage Vitals  Enc Vitals Group     BP 11/22/17 0531 (!) 149/72     Pulse Rate 11/22/17 0531 (!) 56     Resp 11/22/17 0531 15     Temp 11/22/17 0531 98 F (36.7 C)     Temp Source 11/22/17 0531 Oral     SpO2 11/22/17 0531 100 %     Weight 11/22/17 0531 187 lb (84.8 kg)     Height 11/22/17 0531 5\' 4"  (1.626 m)     Head Circumference --      Peak Flow --      Pain Score 11/22/17 0530 8     Pain Loc --      Pain Edu? --      Excl. in [place]? --     Constitutional: Alert and oriented. Answers questions appropriately.  Mildly uncomfortable appearing but nontoxic. Eyes: Conjunctivae are normal and without pallor.  EOMI. No scleral icterus. Head: Atraumatic. Nose: No  congestion/rhinnorhea. Mouth/Throat: Mucous membranes are moist.  Neck: No stridor.  Supple.  No JVD.  No meningismus. Cardiovascular: Normal rate, regular rhythm. No murmurs, rubs or gallops.  Respiratory: Normal respiratory effort.  No accessory muscle use or retractions. Lungs CTAB.  No wheezes, rales or ronchi. Gastrointestinal: Positive right CVA tenderness to palpation.  Soft, and nondistended.  Diffuse tenderness to palpation without focality.  No guarding or rebound.  No peritoneal signs. Musculoskeletal: No LE edema. Neurologic:  A&Ox3.  Speech is clear.  Face and smile are symmetric.  EOMI.  Moves all extremities well. Skin:  Skin is warm, dry and intact. No rash noted. Psychiatric: Mood and affect are normal. Speech and behavior are normal.  Normal judgement.  ____________________________________________   LABS (all labs  ordered are listed, but only abnormal results are displayed)  Labs Reviewed  COMPREHENSIVE METABOLIC PANEL - Abnormal; Notable for the following components:      Result Value   Potassium 2.8 (*)    Glucose, Bld 157 (*)    Calcium 8.7 (*)    Total Protein 6.4 (*)    All other components within normal limits  URINALYSIS, COMPLETE (UACMP) WITH MICROSCOPIC - Abnormal; Notable for the following components:   Color, Urine YELLOW (*)    APPearance CLOUDY (*)    Hgb urine dipstick MODERATE (*)    All other components within normal limits  BASIC METABOLIC PANEL - Abnormal; Notable for the following components:   Glucose, Bld 127 (*)    Calcium 8.1 (*)    All other components within normal limits  CBC   ____________________________________________  EKG  Not Indicated ____________________________________________  RADIOLOGY  Ct Renal Stone Study  Result Date: 11/22/2017 CLINICAL DATA:  Difficulty urinating, RIGHT flank pain radiating to abdomen. Macroscopic hematuria. History of kidney stones with similar symptoms, colon cancer. EXAM: CT ABDOMEN AND PELVIS  WITHOUT CONTRAST TECHNIQUE: Multidetector CT imaging of the abdomen and pelvis was performed following the standard protocol without IV contrast. COMPARISON:  None. FINDINGS: LOWER CHEST: Lung bases are clear. The visualized heart size is normal. No pericardial effusion. HEPATOBILIARY: Scattered subcentimeter hypodensities throughout the liver most compatible with cysts. 3 cm cyst segment 6 of liver. Multiple gallstones measuring to 2.7 cm without CT findings of acute cholecystitis. PANCREAS: Normal. SPLEEN: Normal. ADRENALS/URINARY TRACT: Kidneys are orthotopic, demonstrating normal size and morphology. Punctate RIGHT nephrolithiasis. Mild RIGHT hydroureteronephrosis to the level of the ureterovesicular junction were 3 mm calculus is present. Limited assessment for renal masses by nonenhanced CT. 2 cm cyst RIGHT interpolar kidney. Urinary bladder is partially distended and unremarkable. 4.5 cm homogeneously hypodense benign-appearing LEFT adrenal adenoma with punctate calcifications. STOMACH/BOWEL: Small hiatal hernia. The stomach, small and large bowel are normal in course and caliber without inflammatory changes, sensitivity decreased by lack of enteric contrast. Normal appendix. VASCULAR/LYMPHATIC: Aortoiliac vessels are normal in course and caliber. Mild calcific atherosclerosis. No lymphadenopathy by CT size criteria. REPRODUCTIVE: Normal. OTHER: No intraperitoneal free fluid or free air. MUSCULOSKELETAL: Non-acute. Severe pubic symphyseal osteoarthrosis. Minimal grade 1 L4-5 anterolisthesis on a degenerative basis. Moderate to severe lower lumbar facet arthropathy. Small fat containing umbilical hernia. IMPRESSION: 1. 3 mm RIGHT ureterovesicular junction calculus resulting in mild obstructive uropathy. 2. Punctate RIGHT nephrolithiasis. Aortic Atherosclerosis (ICD10-I70.0). Electronically Signed   By: Elon Alas M.D.   On: 11/22/2017 06:46     ____________________________________________   PROCEDURES  Procedure(s) performed: None  Procedures  Critical Care performed: No ____________________________________________   INITIAL IMPRESSION / ASSESSMENT AND PLAN / ED COURSE  Pertinent labs & imaging results that were available during my care of the patient were reviewed by me and considered in my medical decision making (see chart for details).  67 y.o. female with a history of renal colic presenting with right flank pain, urinary retention, general diffuse abdominal discomfort, and blood-streaked diarrhea.  Overall, the patient is hemodynamically stable.  She has undergone CT which does show a right millimeter stone at the UVJ with associated hydronephrosis.  She is afebrile here, has normal creatinine, normal white blood cell count, no evidence of urinary tract infection.  I will treat her with intravenous fluids, and Toradol for her pain.  She has received Zofran for nausea and is feeling better.  We will do a p.o. challenge to  make sure she is able to tolerate liquids.  The patient does have a hypokalemia, which is likely multifactorial, including inability to take her potassium supplementation and due to her diarrhea.  We will supplement her here and recheck her potassium to ensure appropriate response.  She will restart her home potassium and have this followed up by her primary care physician on Monday.  Patient does have some mild blood streaking with a history of hemorrhoids; this is chronic.  The patient is hemodynamic Lee stable, and has normal blood counts today.  No further work-up is necessary in the emergency department, but I have given her return precautions.  Reviewed the patient's medical chart, and results of her endoscopy and colonoscopy 11/19/17.  The endoscopy did show multiple polyps and esophagitis, polyp was biopsied.  She also did have a sessile polyp in the colon that was biopsied as  well.  ----------------------------------------- 2:03 PM on 11/22/2017 -----------------------------------------  The patient has continued to be hemodynamically stable in the emergency department and afebrile.  Her renal colic discomfort has almost completely resolved.  She has received potassium supplementation and her repeat potassium is 4.6 at this time.  At this time we will plan to discharge the patient home.  She understands follow-up instructions as well as return precautions.  ____________________________________________  FINAL CLINICAL IMPRESSION(S) / ED DIAGNOSES  Final diagnoses:  Hypokalemia  Renal colic on right side  Urinary retention         NEW MEDICATIONS STARTED DURING THIS VISIT:  New Prescriptions   KETOROLAC (TORADOL) 10 MG TABLET    Take 1 tablet (10 mg total) by mouth every 8 (eight) hours as needed for moderate pain (with food).   ONDANSETRON (ZOFRAN ODT) 4 MG DISINTEGRATING TABLET    Take 1 tablet (4 mg total) by mouth every 8 (eight) hours as needed for nausea or vomiting.   OXYCODONE-ACETAMINOPHEN (PERCOCET) 5-325 MG TABLET    Take 1 tablet by mouth every 4 (four) hours as needed for severe pain.   TAMSULOSIN (FLOMAX) 0.4 MG CAPS CAPSULE    Take 1 capsule (0.4 mg total) by mouth daily.      Eula Listen, MD 11/22/17 1404

## 2017-11-22 NOTE — ED Triage Notes (Signed)
Pt reports several hours ago onset of difficulty trying to urinate, followed by multiple episodes of diarrhea, and right side flank pain that radiates to the right abdomen. Blood on the toilet paper when she wipes. She reports hx of kidney stone with similar symptoms before. N/V, no fevers. She also states she she had routine endoscopy and colonoscopy on Wednesday.

## 2017-11-22 NOTE — ED Notes (Signed)
Patient ambulated to the room commode with a steady gait. Patient was able to void easily in a hat. Urine was light yellow and clear. Urine was strained. No stone or debris noticed.

## 2017-11-22 NOTE — Discharge Instructions (Addendum)
Drink plenty of fluids to stay well-hydrated and to help your kidney stone pass.  Please strain your urine, and if you capture the stone, please bring it with you to your urology follow up.  Please make an appointment with your primary care physician on Monday to have your potassium rechecked.  Please continue to take your potassium pills as prescribed.  Return to the emergency department if you develop severe pain, fever, inability to keep down fluids, bleeding, or any other symptoms concerning to you.

## 2017-11-24 ENCOUNTER — Telehealth: Payer: Self-pay | Admitting: *Deleted

## 2017-11-24 NOTE — Telephone Encounter (Signed)
-----   Message from Robert Bellow, MD sent at 11/22/2017  3:38 PM EDT ----- Please notify the patient all of her biopsies were fine.  Polyps in the esophagus are nothing to be concerned about and require no follow-up.  She does not need to return as originally scheduled in 2 weeks to review biopsies.  She can, if she would like.  Please put in recalls for repeat colonoscopy in 5 years.  Thank you ----- Message ----- From: Interface, Lab In [facility] One Sent: 11/21/2017   6:11 PM To: Robert Bellow, MD

## 2017-11-24 NOTE — Progress Notes (Signed)
Subjective:    Patient ID: Melissa Rhodes, female    DOB: 1951-06-04, 67 y.o.   MRN: [id number]  HPI  Melissa Rhodes is a 67 year old female who presents today for an ED follow up to check potassium level.  Reviewed ED records and she was evaluated at the ED on 11/05/39 for renal colic presenting with right flank pain, urinary retention, generalized abdominal pain, and blood streaked diarrhea. Blood streaks have been chronic secondary to hemorrhoids per history. Her CT scan revealed a stone at the UVJ with associated hydronephrosis. Lab work indicated no evidence of UTI, normal creatinine, and normal WBC.  Potassium was low ( 2.8  ) and she reported that she had been unable to swallow her potassium supplement due to sore throat from endoscopy.  She received IV fluids, Toradol for pain, and potassium. Repeat potassium in the ED was 4.6, renal discomfort was nearly completely resolved.  She is to follow up with GI related to endoscopy that did show multiple polyps and esophagitis and poly was biopsied. Patient also had a sessile polyp in the colon that was biopsied. Follow up with GI is scheduled 12/04/17. Hypokalemia was thought to be likely multifactorial with recent diarrhea and patient not taking her potassium supplement.  She is followed by urology and has a follow up appointment 12/29/17.   Today, she reports feeling well. She reports passing a stone and she no longer is taking medications for pain. Associated constipation has been present while taking pain meds however she reports a BM today.  She is taking potassium supplement now as recommended by PCP  Fever: No Chills: No Nausea: No Vomiting: No Diarrhea: No Urinary symptoms: Denies dysuria, hematuria, frequency, retention, or urgency.  She reports history of reflux and recent endoscopy noted LA Grade A reflux esophagitis that was biopsied; small hiatal hernia, and esophageal polyps that were biopsied. She reports today that she  received a phone call from GI that biopsies were negative. She will follow up with GI as recommended. -Shee denies significant dyspepsia, hoarseness, dysphagia, or extreme weight loss, melena, or rectal bleeding. She takes his protein pump inhibitor daily.  Review of Systems  Constitutional: Negative for chills, fatigue and fever.  Respiratory: Negative for cough, shortness of breath and wheezing.   Cardiovascular: Negative for chest pain and palpitations.  Gastrointestinal: Positive for constipation. Negative for abdominal pain, diarrhea, nausea and vomiting.       History of low potassium after stopping suppplement  Genitourinary: Negative for dysuria, flank pain, frequency, hematuria and urgency.  Skin: Negative for rash.  Neurological: Negative for dizziness, weakness, light-headedness and headaches.   Past Medical History:  Diagnosis Date  . Arthritis 2009  . 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 Cancer [facility]) 2005   colon  . GERD (gastroesophageal reflux disease)   . Hypertension 2010  . Mammographic microcalcification 2013  . Melanoma ([place]) 2019   left arm  . Mitral valve prolapse 2006  . 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 Obesity, unspecified 2014  . Personal history of colonic polyps 2013  . Renal disorder    kidney stones  . Special screening for malignant neoplasms, colon 2014  . Tubal pregnancy      Social History   Socioeconomic History  . Marital status: Married    Spouse name: Not on file  . Number of children: Not on file  . Years of education: Not on file  .  Highest education level: Not on file  Occupational History  . Not on file  Social Needs  . Financial resource strain: Not on file  . Food insecurity:    Worry: Not on file    Inability: Not on file  . Transportation needs:    Medical: Not on file    Non-medical: Not on file  Tobacco Use  . Smoking status: Never Smoker  . Smokeless tobacco: Never Used    Substance and Sexual Activity  . Alcohol use: No  . Drug use: No  . Sexual activity: Never  Lifestyle  . Physical activity:    Days per week: Not on file    Minutes per session: Not on file  . Stress: Not on file  Relationships  . Social connections:    Talks on phone: Not on file    Gets together: Not on file    Attends religious service: Not on file    Active member of club or organization: Not on file    Attends meetings of clubs or organizations: Not on file    Relationship status: Not on file  . Intimate partner violence:    Fear of current or ex partner: Not on file    Emotionally abused: Not on file    Physically abused: Not on file    Forced sexual activity: Not on file  Other Topics Concern  . Not on file  Social History Narrative  . Not on file    Past Surgical History:  Procedure Laterality Date  . BREAST BIOPSY Left 2013  . BREAST BIOPSY Right 03/06/2017   LIQ coil clip. FEATURES OF RADIAL SCAR  . BREAST SURGERY Left 2013   re excision of left breast showed small area of residual ADH, but she was not upsatged to DCIS or invasive cancer. The margins were clear.  Patient declined tamoxifen therapy  . COLONOSCOPY  [id number]  . COLONOSCOPY W/ POLYPECTOMY  2014  . ECTOPIC PREGNANCY SURGERY  1981  . kidney stones  2009  . SHOULDER SURGERY Right 2007  . TUBAL LIGATION  1981    Family History  Problem Relation Age of Onset  . Cancer Father 22       lung  . Early death Father   . Arthritis Mother   . Depression Mother   . Diabetes Mother   . Heart disease Mother   . Hypertension Mother   . Hyperlipidemia Mother   . Stroke Mother   . Depression Sister   . Diabetes Sister   . Hyperlipidemia Sister   . Hypertension Sister   . Depression Sister   . Hyperlipidemia Sister   . Hypertension Sister   . Miscarriages / Stillbirths Sister   . Depression Sister   . Hyperlipidemia Sister   . Hypertension Sister   . Depression Brother   . Diabetes Brother   .  Hyperlipidemia Brother   . Hypertension Brother   . Stroke Brother   . Breast cancer Neg Hx   . Ovarian cancer Neg Hx     No Known Allergies  Current Outpatient Medications on File Prior to Visit  Medication Sig Dispense Refill  . anastrozole (ARIMIDEX) 1 MG tablet Take 1 mg by mouth daily.     Marland Kitchen aspirin 81 MG tablet Take 81 mg by mouth daily.     . clonazePAM (KLONOPIN) 0.5 MG tablet Take 1 tablet (0.5 mg total) by mouth daily. 30 tablet 1  . fluocinonide cream (LIDEX) 6.64 % Apply 1 application  topically 2 (two) times daily.     . hydrochlorothiazide (HYDRODIURIL) 25 MG tablet Take 25 mg by mouth daily.    Marland Kitchen lisinopril (PRINIVIL,ZESTRIL) 10 MG tablet Take 1 tablet (10 mg total) by mouth daily. 90 tablet 0  . pantoprazole (PROTONIX) 40 MG tablet Take 1 tablet (40 mg total) by mouth daily. Take 30 minutes before your evening meal 90 tablet 0  . potassium chloride (K-DUR,KLOR-CON) 10 MEQ tablet Take 1 tablet (10 mEq total) by mouth 2 (two) times daily. 180 tablet 1  . rOPINIRole (REQUIP) 1 MG tablet Take 1 tablet (1 mg total) by mouth at bedtime. 30 tablet 3   No current facility-administered medications on file prior to visit.     BP 140/70 (BP Location: Right Arm, Patient Position: Sitting, Cuff Size: Large)   Pulse (!) 58   Temp 98.3 F (36.8 C) (Oral)   Wt 187 lb 8 oz (85 kg)   SpO2 96%   BMI 32.18 kg/m      Objective:   Physical Exam  Constitutional: She is oriented to person, place, and time. She appears well-developed and well-nourished.  Eyes: Pupils are equal, round, and reactive to light. No scleral icterus.  Neck: Neck supple.  Cardiovascular: Normal rate, normal heart sounds and intact distal pulses.  Pulmonary/Chest: Effort normal and breath sounds normal. She has no wheezes. She has no rales.  Abdominal: Soft. Bowel sounds are normal. There is no tenderness. There is no CVA tenderness.  Lymphadenopathy:    She has no cervical adenopathy.  Neurological: She is  alert and oriented to person, place, and time.  Skin: Skin is warm and dry. Capillary refill takes less than 2 seconds.  Psychiatric: She has a normal mood and affect. Her behavior is normal. Judgment and thought content normal.       Assessment & Plan:  1. Hypokalemia Recheck of BMP today; patient has continued supplement as prescribed by PCP. No diarrhea has been present.   - Basic metabolic panel  2. Gastroesophageal reflux disease, esophagitis presence not specified No symptoms today; advised avoidance of food/medication triggers that aggravate reflux. Further advised her to continue with PPI as prescribed by PCP. She will follow up with GI and PCP as scheduled.   3. Renal colic on right side Resolved; patient reports passing stone. Exam is reassuring today. No CVA tenderness present. She will follow up with urology as scheduled.   Return precautions provided and also reviewed triggers for reflux with information provided.  4. Drug-induced constipation Resolved; constipation was noted when patient was taking percocet for renal colic. BM today. She will increase water and fiber intake.   Return precautions provided. Follow up with PCP, urology, and GI as scheduled or sooner if needed.   Delano Metz, FNP-C

## 2017-11-24 NOTE — Telephone Encounter (Signed)
Notified patient as instructed, patient agrees. Patient was place in recalls for 5 years

## 2017-11-25 ENCOUNTER — Other Ambulatory Visit: Payer: Self-pay

## 2017-11-25 ENCOUNTER — Ambulatory Visit (INDEPENDENT_AMBULATORY_CARE_PROVIDER_SITE_OTHER): Payer: PPO | Admitting: Family Medicine

## 2017-11-25 ENCOUNTER — Encounter: Payer: Self-pay | Admitting: Family Medicine

## 2017-11-25 VITALS — BP 140/70 | HR 58 | Temp 98.3°F | Wt 187.5 lb

## 2017-11-25 DIAGNOSIS — K219 Gastro-esophageal reflux disease without esophagitis: Secondary | ICD-10-CM | POA: Diagnosis not present

## 2017-11-25 DIAGNOSIS — N23 Unspecified renal colic: Secondary | ICD-10-CM

## 2017-11-25 DIAGNOSIS — E876 Hypokalemia: Secondary | ICD-10-CM | POA: Diagnosis not present

## 2017-11-25 DIAGNOSIS — K5903 Drug induced constipation: Secondary | ICD-10-CM | POA: Diagnosis not present

## 2017-11-25 LAB — BASIC METABOLIC PANEL
BUN: 15 mg/dL (ref 6–23)
CALCIUM: 9.4 mg/dL (ref 8.4–10.5)
CO2: 29 mEq/L (ref 19–32)
Chloride: 106 mEq/L (ref 96–112)
Creatinine, Ser: 0.73 mg/dL (ref 0.40–1.20)
GFR: 84.41 mL/min (ref >60.00)
GLUCOSE: 98 mg/dL (ref 70–99)
Potassium: 4.3 mEq/L (ref 3.5–5.1)
Sodium: 142 mEq/L (ref 135–145)

## 2017-11-25 NOTE — Patient Outreach (Addendum)
[facility]) Care Management  11/25/2017  Melissa Rhodes [birth year] [id number]   Telephone Screen  Referral Date: 11/25/17 Referral Source: [facility] Dept. Referral Reason: "member requested medication co-pay assistance. She did not state which medication." Insurance: [employer]   Outreach attempt #1   to patient. Spoke with patient and screening completed.   Social:Patient resides in her home along with her spouse. She voices that she is independent with ADLs/IADLs. She drives herself to appts.   Conditions:Per chart review, patient has PMH of arthritis, colon CA, GERD, HTN, melanoma, Breast CA and renal disorder. Patient voices that she is managing her BP. She denies any issues or concerns regarding her chronic  Conditions. She voices that she is knowledgeable and knows how to manage them. She was in the ED on 11/22/17 for hypokalemia and renal colic which she voices has resolved.    Medications: Patient reports she is taking about eight meds. She voices that she is on a fixed income and her cost of meds become expensive at times. Patient unable to voice specific name of expensive meds as she voiced no med in particular is more expensive than others. She states that she just wants to know if she qualifies or can get any type of assistance for any of her meds.    Consent: [facility] services reviewed and discussed. Patient gave verbal consent for services. She voices only needing [facility] pharmacy assistance at this time.   Plan: RN CM will send [facility] pharmacy referral for possible med assistance.    Enzo Montgomery, RN,BSN,CCM [facility] Telephonic Care Management Coordinator Direct Phone: [phone] Toll Free: [phone] Fax: [phone]

## 2017-11-25 NOTE — Patient Instructions (Addendum)
Please go to the lab before you leave.  Glad to hear that you are feeling better.  Follow up with Dr. Nicki Reaper as scheduled.  Continue to avoid foods that can aggravate symptoms of reflux.  See list of foods below.  Follow up with urology as scheduled.  If you notice any urinary symptoms of discomfort, pain in back, or you are unable to urinate, please seek medical attention immediately.   Kidney Stones Kidney stones (urolithiasis) are rock-like masses that form inside of the kidneys. Kidneys are organs that make pee (urine). A kidney stone can cause very bad pain and can block the flow of pee. The stone usually leaves your body (passes) through your pee. You may need to have a doctor take out the stone. Follow these instructions at home: Eating and drinking  Drink enough fluid to keep your pee clear or pale yellow. This will help you pass the stone.  If told by your doctor, change the foods you eat (your diet). This may include: ? Limiting how much salt (sodium) you eat. ? Eating more fruits and vegetables. ? Limiting how much meat, poultry, fish, and eggs you eat.  Follow instructions from your doctor about eating or drinking restrictions. General instructions  Collect pee samples as told by your doctor. You may need to collect a pee sample: ? 24 hours after a stone comes out. ? 8-12 weeks after a stone comes out, and every 6-12 months after that.  Strain your pee every time you pee (urinate), for as long as told. Use the strainer that your doctor recommends.  Do not throw out the stone. Keep it so that it can be tested by your doctor.  Take over-the-counter and prescription medicines only as told by your doctor.  Keep all follow-up visits as told by your doctor. This is important. You may need follow-up tests. Preventing kidney stones To prevent another kidney stone:  Drink enough fluid to keep your pee clear or pale yellow. This is the best way to prevent kidney  stones.  Eat healthy foods.  Avoid certain foods as told by your doctor. You may be told to eat less protein.  Stay at a healthy weight.  Contact a doctor if:  You have pain that gets worse or does not get better with medicine. Get help right away if:  You have a fever or chills.  You get very bad pain.  You get new pain in your belly (abdomen).  You pass out (faint).  You cannot pee. This information is not intended to replace advice given to you by your health care provider. Make sure you discuss any questions you have with your health care provider. Document Released: 11/13/2007 Document Revised: 02/13/2016 Document Reviewed: 02/13/2016 Elsevier Interactive Patient Education  2017 [place] for Gastroesophageal Reflux Disease, Adult When you have gastroesophageal reflux disease (GERD), the foods you eat and your eating habits are very important. Choosing the right foods can help ease your discomfort. What guidelines do I need to follow?  Choose fruits, vegetables, whole grains, and low-fat dairy products.  Choose low-fat meat, fish, and poultry.  Limit fats such as oils, salad dressings, butter, nuts, and avocado.  Keep a food diary. This helps you identify foods that cause symptoms.  Avoid foods that cause symptoms. These may be different for everyone.  Eat small meals often instead of 3 large meals a day.  Eat your meals slowly, in a place where you are relaxed.  Limit  fried foods.  Cook foods using methods other than frying.  Avoid drinking alcohol.  Avoid drinking large amounts of liquids with your meals.  Avoid bending over or lying down until 2-3 hours after eating. What foods are not recommended? These are some foods and drinks that may make your symptoms worse: Vegetables Tomatoes. Tomato juice. Tomato and spaghetti sauce. Chili peppers. Onion and garlic. Horseradish. Fruits Oranges, grapefruit, and lemon (fruit and  juice). Meats High-fat meats, fish, and poultry. This includes hot dogs, ribs, ham, sausage, salami, and bacon. Dairy Whole milk and chocolate milk. Sour cream. Cream. Butter. Ice cream. Cream cheese. Drinks Coffee and tea. Bubbly (carbonated) drinks or energy drinks. Condiments Hot sauce. Barbecue sauce. Sweets/Desserts Chocolate and cocoa. Donuts. Peppermint and spearmint. Fats and Oils High-fat foods. This includes [place] fries and potato chips. Other Vinegar. Strong spices. This includes black pepper, white pepper, red pepper, cayenne, curry powder, cloves, ginger, and chili powder. The items listed above may not be a complete list of foods and drinks to avoid. Contact your dietitian for more information. This information is not intended to replace advice given to you by your health care provider. Make sure you discuss any questions you have with your health care provider. Document Released: 11/26/2011 Document Revised: 11/02/2015 Document Reviewed: 03/31/2013 Elsevier Interactive Patient Education  2017 [employer].

## 2017-11-27 ENCOUNTER — Other Ambulatory Visit: Payer: Self-pay | Admitting: Pharmacist

## 2017-11-27 DIAGNOSIS — D1801 Hemangioma of skin and subcutaneous tissue: Secondary | ICD-10-CM | POA: Diagnosis not present

## 2017-11-27 DIAGNOSIS — D485 Neoplasm of uncertain behavior of skin: Secondary | ICD-10-CM | POA: Diagnosis not present

## 2017-11-27 DIAGNOSIS — L821 Other seborrheic keratosis: Secondary | ICD-10-CM | POA: Diagnosis not present

## 2017-11-27 DIAGNOSIS — Z85820 Personal history of malignant melanoma of skin: Secondary | ICD-10-CM | POA: Diagnosis not present

## 2017-11-27 DIAGNOSIS — Z08 Encounter for follow-up examination after completed treatment for malignant neoplasm: Secondary | ICD-10-CM | POA: Diagnosis not present

## 2017-11-27 NOTE — Patient Outreach (Signed)
[facility]) Care Management  [place]   11/27/2017  Melissa Rhodes [birth year] [id number]  Subjective: 67 y.o. year old female referred to [facility] for Medication Assistance Referred by [facility] for medication assistance. See Enzo Montgomery, [facility] Telephonic RNCM, note 11/25/17 for further detail.   PMH s/f: HTN, GERD, breast cancer, hypercholesterolemia, restless legs (found in office visit note)  Patient with [employer] Medicare Advantage plan.   Patient confirms identity with HIPAA-identifiers x2 and gives verbal consent to speak over the phone about medications.    Medication Adherence: Dispensed medication report reflects very good adherence. Patient fills chronic medications in 90 days supply.    Medication Assistance:  Patient is interested to see if there are any medication savings programs she could qualify for. She states that her medications are currently affordable and no particular medication is too expensive for her, causing a barrier to care. "Just want to save if I can"   Medication Management:  Patient manages her own medications.    Objective:   Current Medications: Current Outpatient Medications  Medication Sig Dispense Refill  . anastrozole (ARIMIDEX) 1 MG tablet Take 1 mg by mouth daily.     Marland Kitchen aspirin 81 MG tablet Take 81 mg by mouth daily.     . clonazePAM (KLONOPIN) 0.5 MG tablet Take 1 tablet (0.5 mg total) by mouth daily. 30 tablet 1  . fluocinonide cream (LIDEX) 4.23 % Apply 1 application topically 2 (two) times daily.     . hydrochlorothiazide (HYDRODIURIL) 25 MG tablet Take 25 mg by mouth daily.    Marland Kitchen lisinopril (PRINIVIL,ZESTRIL) 10 MG tablet Take 1 tablet (10 mg total) by mouth daily. 90 tablet 0  . pantoprazole (PROTONIX) 40 MG tablet Take 1 tablet (40 mg total) by mouth daily. Take 30 minutes before your evening meal 90 tablet 0  . potassium chloride (K-DUR,KLOR-CON) 10 MEQ tablet Take 1 tablet (10 mEq  total) by mouth 2 (two) times daily. 180 tablet 1  . rOPINIRole (REQUIP) 1 MG tablet Take 1 tablet (1 mg total) by mouth at bedtime. 30 tablet 3   No current facility-administered medications for this visit.     Functional Status: No flowsheet data found.  Fall/Depression Screening: No flowsheet data found. PHQ 2/9 Scores 07/09/2017  PHQ - 2 Score 0  PHQ- 9 Score 3    Assessment:  Drugs sorted by system:  Neurologic/Psychologic: clonazepam, ropinirole  Cardiovascular: aspirin, lisinopril, HCTZ  Gastrointestinal: pantoprazole  Topical: fluocinonide  Vitamins/Minerals: potassium chloride  Miscellaneous: anastrazole  Drugs sorted by problem: Breast cancer: anastrozole GERD: pantoprazole Hypertension: lisinopril, HCTZ, potassium Restless leg syndrome: ropinirole Unknown: fluocinonide, clonazepam  Medications to avoid in the elderly: clonazepam, pantoprazole  Plan: Counseled patient on Medicare Part D and her [employer] plan. She does not have any medications on her medication list that currently have a medication assistance program. Reviewing her medication list, noted that steroid cream fluocinonide is Tier 3 on [employer] formulary. Will share lower tier steroid creams with patient's primary care physician and prescriber, Dr. Nicki Reaper. Patient states she has no further questions at this time. I provided patient with my contact information should she have any need for [facility] clinical pharmacy services in the future. Patient verbalized understanding. I have closed pharmacy episode and removed myself from the care team.   Ruben Reason, PharmD Clinical Pharmacist, [place] [phone]

## 2017-12-02 ENCOUNTER — Encounter: Payer: Self-pay | Admitting: Internal Medicine

## 2017-12-02 MED ORDER — CLONAZEPAM 0.5 MG PO TABS: 0.5000 mg | ORAL_TABLET | Freq: Every day | ORAL | 1 refills | Status: DC

## 2017-12-02 MED ORDER — LISINOPRIL 10 MG PO TABS: 10.0000 mg | ORAL_TABLET | Freq: Every day | ORAL | 1 refills | Status: DC

## 2017-12-02 NOTE — Telephone Encounter (Signed)
Last OV 11/25/2017 Next OV 02/23/2018 Last refill 10/06/17

## 2017-12-04 ENCOUNTER — Encounter: Payer: Self-pay | Admitting: Urology

## 2017-12-04 ENCOUNTER — Encounter

## 2017-12-04 ENCOUNTER — Ambulatory Visit: Payer: PPO | Admitting: General Surgery

## 2017-12-04 ENCOUNTER — Ambulatory Visit: Payer: PPO | Admitting: Urology

## 2017-12-04 VITALS — BP 157/89 | HR 74 | Ht 64.0 in | Wt 183.0 lb

## 2017-12-04 DIAGNOSIS — N200 Calculus of kidney: Secondary | ICD-10-CM

## 2017-12-04 NOTE — Progress Notes (Signed)
12/04/2017 9:11 AM   Melissa Rhodes [birth year] [id number]  Referring provider: Einar Pheasant, [address] [place], [place] [address]  Chief Complaint  Patient presents with  . Nephrolithiasis    New Patient    HPI: 67 year old female who presents today for ER follow-up for a right 3 mm distal ureteral stone.  She presented to the emergency room on 11/22/2017 with acute onset left flank pain.  She underwent CT renal stone protocol demonstrating a 3 mm right UVJ stone with mild obstructive hydronephrosis.  She also has a punctate right stone.  No left-sided nephrolithiasis.  At the time, she had no signs or symptoms of infection.  Had a normal white count.  UA was unremarkable other than a small amount of blood.  Her creatinine was normal.  She was discharged home with pain medication and medical expulsive therapy.  She has not had pain since.  She passed the stone the following day.  She brings the stone with her today.  She had a stone episode in 2015 which she was able to pass spontaneously as well.  She drinks 95% water, about 8 glasses.  She watches her sodium intake.  She does admit to gardening a lot outside sweating and having dark urine occasionally.  Denies any urinary symptoms today.  PMH: Past Medical History:  Diagnosis Date  . Arthritis 2009  . 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 Cancer [facility]) 2005   colon  . GERD (gastroesophageal reflux disease)   . Hypertension 2010  . Mammographic microcalcification 2013  . Melanoma ([place]) 2019   left arm  . Mitral valve prolapse 2006  . 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 Obesity, unspecified 2014  . Personal history of colonic polyps 2013  . Renal disorder    kidney stones  . Special screening for malignant neoplasms, colon 2014  . Tubal pregnancy     Surgical History: Past Surgical History:  Procedure Laterality Date  . BREAST BIOPSY Left 2013    . BREAST BIOPSY Right 03/06/2017   LIQ coil clip. FEATURES OF RADIAL SCAR  . BREAST SURGERY Left 2013   re excision of left breast showed small area of residual ADH, but she was not upsatged to DCIS or invasive cancer. The margins were clear.  Patient declined tamoxifen therapy  . COLONOSCOPY  [id number]  . COLONOSCOPY W/ POLYPECTOMY  2014  . COLONOSCOPY WITH PROPOFOL N/A 11/19/2017   Procedure: COLONOSCOPY WITH PROPOFOL;  Surgeon: Robert Bellow, MD;  Location: [facility] ENDOSCOPY;  Service: Endoscopy;  Laterality: N/A;  . ECTOPIC PREGNANCY SURGERY  1981  . ESOPHAGOGASTRODUODENOSCOPY (EGD) WITH PROPOFOL N/A 11/19/2017   Procedure: ESOPHAGOGASTRODUODENOSCOPY (EGD) WITH PROPOFOL;  Surgeon: Robert Bellow, MD;  Location: [facility] ENDOSCOPY;  Service: Endoscopy;  Laterality: N/A;  . kidney stones  2009  . SHOULDER SURGERY Right 2007  . TUBAL LIGATION  1981    Home Medications:  Allergies as of 12/04/2017   No Known Allergies     Medication List        Accurate as of 12/04/17  9:11 AM. Always use your most recent med list.          ARIMIDEX 1 MG tablet Generic drug:  anastrozole Take 1 mg by mouth daily.   aspirin 81 MG tablet Take 81 mg by mouth daily.   clonazePAM 0.5 MG tablet Commonly known as:  KLONOPIN Take 1 tablet (0.5 mg total) by  mouth daily.   fluocinonide cream 0.05 % Commonly known as:  LIDEX Apply 1 application topically 2 (two) times daily.   hydrochlorothiazide 25 MG tablet Commonly known as:  HYDRODIURIL Take 25 mg by mouth daily.   lisinopril 10 MG tablet Commonly known as:  PRINIVIL,ZESTRIL Take 1 tablet (10 mg total) by mouth daily.   pantoprazole 40 MG tablet Commonly known as:  PROTONIX Take 1 tablet (40 mg total) by mouth daily. Take 30 minutes before your evening meal   potassium chloride 10 MEQ tablet Commonly known as:  K-DUR,KLOR-CON Take 1 tablet (10 mEq total) by mouth 2 (two) times daily.   rOPINIRole 1 MG tablet Commonly known as:   REQUIP Take 1 tablet (1 mg total) by mouth at bedtime.       Allergies: No Known Allergies  Family History: Family History  Problem Relation Age of Onset  . Cancer Father 27       lung  . Early death Father   . Arthritis Mother   . Depression Mother   . Diabetes Mother   . Heart disease Mother   . Hypertension Mother   . Hyperlipidemia Mother   . Stroke Mother   . Depression Sister   . Diabetes Sister   . Hyperlipidemia Sister   . Hypertension Sister   . Depression Sister   . Hyperlipidemia Sister   . Hypertension Sister   . Miscarriages / Stillbirths Sister   . Depression Sister   . Hyperlipidemia Sister   . Hypertension Sister   . Depression Brother   . Diabetes Brother   . Hyperlipidemia Brother   . Hypertension Brother   . Stroke Brother   . Breast cancer Neg Hx   . Ovarian cancer Neg Hx     Social History:  reports that she has never smoked. She has never used smokeless tobacco. She reports that she does not drink alcohol or use drugs.  ROS: UROLOGY Frequent Urination?: No Hard to postpone urination?: No Burning/pain with urination?: No Get up at night to urinate?: No Leakage of urine?: No Urine stream starts and stops?: No Trouble starting stream?: No Do you have to strain to urinate?: No Blood in urine?: Yes Urinary tract infection?: No Sexually transmitted disease?: No Injury to kidneys or bladder?: No Painful intercourse?: No Weak stream?: No Currently pregnant?: No Vaginal bleeding?: No Last menstrual period?: 2002  Gastrointestinal Nausea?: Yes Vomiting?: No Indigestion/heartburn?: Yes Diarrhea?: No Constipation?: No  Constitutional Fever: No Night sweats?: No Weight loss?: No Fatigue?: Yes  Skin Skin rash/lesions?: Yes Itching?: No  Eyes Blurred vision?: No Double vision?: No  Ears/Nose/Throat Sore throat?: No Sinus problems?: No  Hematologic/Lymphatic Swollen glands?: No Easy bruising?: Yes  Cardiovascular Leg  swelling?: No Chest pain?: No  Respiratory Cough?: No Shortness of breath?: No  Endocrine Excessive thirst?: No  Musculoskeletal Back pain?: Yes Joint pain?: Yes  Neurological Headaches?: No Dizziness?: No  Psychologic Depression?: No Anxiety?: No  Physical Exam: BP (!) 157/89   Pulse 74   Ht 5\' 4"  (1.626 m)   Wt 183 lb (83 kg)   BMI 31.41 kg/m   Constitutional:  Alert and oriented, No acute distress. HEENT: [place] AT, moist mucus membranes.  Trachea midline, no masses. Cardiovascular: No clubbing, cyanosis, or edema. Respiratory: Normal respiratory effort, no increased work of breathing. GI: Abdomen is soft, nontender, nondistended, no abdominal masses GU: No CVA tenderness Skin: No rashes, bruises or suspicious lesions. Neurologic: Grossly intact, no focal deficits, moving all 4  extremities. Psychiatric: Normal mood and affect.  Laboratory Data: Lab Results  Component Value Date   WBC 3.7 11/22/2017   HGB 13.4 11/22/2017   HCT 38.3 11/22/2017   MCV 86.1 11/22/2017   PLT 175 11/22/2017    Lab Results  Component Value Date   CREATININE 0.73 11/25/2017    Urinalysis n/a  Pertinent Imaging: Results for orders placed during the hospital encounter of 11/22/17  CT Renal Stone Study   Narrative CLINICAL DATA:  Difficulty urinating, RIGHT flank pain radiating to abdomen. Macroscopic hematuria. History of kidney stones with similar symptoms, colon cancer.  EXAM: CT ABDOMEN AND PELVIS WITHOUT CONTRAST  TECHNIQUE: Multidetector CT imaging of the abdomen and pelvis was performed following the standard protocol without IV contrast.  COMPARISON:  None.  FINDINGS: LOWER CHEST: Lung bases are clear. The visualized heart size is normal. No pericardial effusion.  HEPATOBILIARY: Scattered subcentimeter hypodensities throughout the liver most compatible with cysts. 3 cm cyst segment 6 of liver. Multiple gallstones measuring to 2.7 cm without CT findings of  acute cholecystitis.  PANCREAS: Normal.  SPLEEN: Normal.  ADRENALS/URINARY TRACT: Kidneys are orthotopic, demonstrating normal size and morphology. Punctate RIGHT nephrolithiasis. Mild RIGHT hydroureteronephrosis to the level of the ureterovesicular junction were 3 mm calculus is present. Limited assessment for renal masses by nonenhanced CT. 2 cm cyst RIGHT interpolar kidney. Urinary bladder is partially distended and unremarkable. 4.5 cm homogeneously hypodense benign-appearing LEFT adrenal adenoma with punctate calcifications.  STOMACH/BOWEL: Small hiatal hernia. The stomach, small and large bowel are normal in course and caliber without inflammatory changes, sensitivity decreased by lack of enteric contrast. Normal appendix.  VASCULAR/LYMPHATIC: Aortoiliac vessels are normal in course and caliber. Mild calcific atherosclerosis. No lymphadenopathy by CT size criteria.  REPRODUCTIVE: Normal.  OTHER: No intraperitoneal free fluid or free air.  MUSCULOSKELETAL: Non-acute. Severe pubic symphyseal osteoarthrosis. Minimal grade 1 L4-5 anterolisthesis on a degenerative basis. Moderate to severe lower lumbar facet arthropathy. Small fat containing umbilical hernia.  IMPRESSION: 1. 3 mm RIGHT ureterovesicular junction calculus resulting in mild obstructive uropathy. 2. Punctate RIGHT nephrolithiasis.  Aortic Atherosclerosis (ICD10-I70.0).   Electronically Signed   By: Elon Alas M.D.   On: 11/22/2017 06:46    CT imaging was personally reviewed today and with the patient. Agree with interpretation.  Assessment & Plan:    1. Nephrolithiasis Recent passage of right UVJ stone-Stone sent for stone analysis, will call with results Residual punctate right stone which is minuscule in size We discussed general stone prevention techniques including drinking plenty water with goal of producing 2.5 L urine daily, increased citric acid intake, avoidance of high oxalate  containing foods, and decreased salt intake.  Information about dietary recommendations given today.   She will follow-up on a as needed basis.  We will call her with her stone analysis results.   Return if symptoms worsen or fail to improve.  Hollice Espy, MD  [facility] [address], [place], [place] [address] [phone]

## 2017-12-04 NOTE — Addendum Note (Signed)
Addended by: Tommy Rainwater on: 12/04/2017 10:23 AM   Modules accepted: Orders

## 2017-12-05 NOTE — Progress Notes (Signed)
I have not prescribed this medication to her.  Would recommend contacting prescribing physician.  Thank you for your help.

## 2017-12-17 ENCOUNTER — Other Ambulatory Visit (INDEPENDENT_AMBULATORY_CARE_PROVIDER_SITE_OTHER): Payer: PPO

## 2017-12-17 DIAGNOSIS — E7800 Pure hypercholesterolemia, unspecified: Secondary | ICD-10-CM | POA: Diagnosis not present

## 2017-12-17 DIAGNOSIS — I10 Essential (primary) hypertension: Secondary | ICD-10-CM

## 2017-12-17 LAB — BASIC METABOLIC PANEL
BUN: 19 mg/dL (ref 6–23)
CHLORIDE: 107 meq/L (ref 96–112)
CO2: 31 meq/L (ref 19–32)
CREATININE: 0.77 mg/dL (ref 0.40–1.20)
Calcium: 9.3 mg/dL (ref 8.4–10.5)
GFR: 79.36 mL/min (ref >60.00)
Glucose, Bld: 113 mg/dL — ABNORMAL HIGH (ref 70–99)
POTASSIUM: 4.2 meq/L (ref 3.5–5.1)
Sodium: 143 mEq/L (ref 135–145)

## 2017-12-17 LAB — CBC WITH DIFFERENTIAL/PLATELET
BASOS ABS: 0 10*3/uL (ref 0.0–0.1)
BASOS PCT: 0.4 % (ref 0.0–3.0)
EOS ABS: 0.1 10*3/uL (ref 0.0–0.7)
Eosinophils Relative: 2.2 % (ref 0.0–5.0)
HEMATOCRIT: 38.8 % (ref 36.0–46.0)
HEMOGLOBIN: 13.3 g/dL (ref 12.0–15.0)
LYMPHS PCT: 28 % (ref 12.0–46.0)
Lymphs Abs: 1 10*3/uL (ref 0.7–4.0)
MCHC: 34.2 g/dL (ref 30.0–36.0)
MCV: 87.6 fl (ref 78.0–100.0)
Monocytes Absolute: 0.3 10*3/uL (ref 0.1–1.0)
Monocytes Relative: 9.5 % (ref 3.0–12.0)
Neutro Abs: 2.1 10*3/uL (ref 1.4–7.7)
Neutrophils Relative %: 59.9 % (ref 43.0–77.0)
Platelets: 159 10*3/uL (ref 150.0–400.0)
RBC: 4.43 Mil/uL (ref 3.87–5.11)
RDW: 13.6 % (ref 11.5–15.5)
WBC: 3.5 10*3/uL — AB (ref 4.0–10.5)

## 2017-12-17 LAB — HEPATIC FUNCTION PANEL
ALBUMIN: 4.1 g/dL (ref 3.5–5.2)
ALT: 16 U/L (ref 0–35)
AST: 16 U/L (ref 0–37)
Alkaline Phosphatase: 58 U/L (ref 39–117)
Bilirubin, Direct: 0.1 mg/dL (ref 0.0–0.3)
TOTAL PROTEIN: 6.2 g/dL (ref 6.0–8.3)
Total Bilirubin: 0.4 mg/dL (ref 0.2–1.2)

## 2017-12-17 LAB — LIPID PANEL
CHOL/HDL RATIO: 4
CHOLESTEROL: 203 mg/dL — AB (ref 0–200)
HDL: 53.1 mg/dL (ref >39.00)
LDL CALC: 134 mg/dL — AB (ref 0–99)
NonHDL: 149.6
TRIGLYCERIDES: 80 mg/dL (ref 0.0–149.0)
VLDL: 16 mg/dL (ref 0.0–40.0)

## 2017-12-18 ENCOUNTER — Other Ambulatory Visit: Payer: Self-pay | Admitting: Internal Medicine

## 2017-12-18 DIAGNOSIS — E7800 Pure hypercholesterolemia, unspecified: Secondary | ICD-10-CM

## 2017-12-18 DIAGNOSIS — D72819 Decreased white blood cell count, unspecified: Secondary | ICD-10-CM

## 2017-12-18 DIAGNOSIS — R739 Hyperglycemia, unspecified: Secondary | ICD-10-CM | POA: Insufficient documentation

## 2017-12-18 MED ORDER — ROSUVASTATIN CALCIUM 20 MG PO TABS: 20.0000 mg | ORAL_TABLET | Freq: Every day | ORAL | 1 refills | Status: DC

## 2017-12-18 NOTE — Progress Notes (Signed)
Orders placed for f/u labs.  

## 2017-12-18 NOTE — Progress Notes (Signed)
rx ok'd for crestor #30 with one refill

## 2017-12-21 ENCOUNTER — Telehealth: Payer: Self-pay | Admitting: Urology

## 2017-12-21 NOTE — Telephone Encounter (Signed)
Please review stone analysis results with this patient.  Her stone is primarily calcium oxalate monohydrate which is a very hard calcium stone.  We previously discussed stone diet and prevention techniques.  Please refer her to the handout which she likely received at our appointment.  Hollice Espy, MD

## 2017-12-22 ENCOUNTER — Encounter: Payer: Self-pay | Admitting: General Surgery

## 2017-12-24 ENCOUNTER — Encounter: Payer: Self-pay | Admitting: Internal Medicine

## 2017-12-25 DIAGNOSIS — S99921A Unspecified injury of right foot, initial encounter: Secondary | ICD-10-CM | POA: Diagnosis not present

## 2017-12-25 DIAGNOSIS — M79671 Pain in right foot: Secondary | ICD-10-CM | POA: Diagnosis not present

## 2017-12-25 DIAGNOSIS — M19071 Primary osteoarthritis, right ankle and foot: Secondary | ICD-10-CM | POA: Diagnosis not present

## 2017-12-25 DIAGNOSIS — M25571 Pain in right ankle and joints of right foot: Secondary | ICD-10-CM | POA: Diagnosis not present

## 2017-12-29 ENCOUNTER — Ambulatory Visit: Payer: Self-pay | Admitting: Urology

## 2018-01-12 DIAGNOSIS — S99921D Unspecified injury of right foot, subsequent encounter: Secondary | ICD-10-CM | POA: Diagnosis not present

## 2018-01-12 DIAGNOSIS — S92344D Nondisplaced fracture of fourth metatarsal bone, right foot, subsequent encounter for fracture with routine healing: Secondary | ICD-10-CM | POA: Diagnosis not present

## 2018-01-18 ENCOUNTER — Other Ambulatory Visit: Payer: Self-pay | Admitting: Internal Medicine

## 2018-01-31 ENCOUNTER — Other Ambulatory Visit: Payer: Self-pay | Admitting: Internal Medicine

## 2018-02-01 ENCOUNTER — Encounter: Payer: Self-pay | Admitting: Internal Medicine

## 2018-02-02 ENCOUNTER — Other Ambulatory Visit: Payer: Self-pay

## 2018-02-02 DIAGNOSIS — L578 Other skin changes due to chronic exposure to nonionizing radiation: Secondary | ICD-10-CM | POA: Diagnosis not present

## 2018-02-02 DIAGNOSIS — L814 Other melanin hyperpigmentation: Secondary | ICD-10-CM | POA: Diagnosis not present

## 2018-02-02 DIAGNOSIS — Z85828 Personal history of other malignant neoplasm of skin: Secondary | ICD-10-CM | POA: Diagnosis not present

## 2018-02-02 DIAGNOSIS — H61112 Acquired deformity of pinna, left ear: Secondary | ICD-10-CM | POA: Diagnosis not present

## 2018-02-02 DIAGNOSIS — L988 Other specified disorders of the skin and subcutaneous tissue: Secondary | ICD-10-CM | POA: Diagnosis not present

## 2018-02-02 DIAGNOSIS — D0322 Melanoma in situ of left ear and external auricular canal: Secondary | ICD-10-CM | POA: Diagnosis not present

## 2018-02-02 NOTE — Telephone Encounter (Signed)
Patient is a newer patient of yours, we have not filled her HCTZ before. OK to send in #90 with 1?  Also, pt requesting 90 day script for her clonazepam.  Last OV 11/25/17 Next OV 02/23/18 Last refill 12/02/17 #30 with 1 refill

## 2018-02-03 MED ORDER — HYDROCHLOROTHIAZIDE 25 MG PO TABS: 25.0000 mg | ORAL_TABLET | Freq: Every day | ORAL | 1 refills | Status: DC

## 2018-02-03 MED ORDER — CLONAZEPAM 0.5 MG PO TABS: 0.5000 mg | ORAL_TABLET | Freq: Every day | ORAL | 1 refills | Status: DC

## 2018-02-10 DIAGNOSIS — S92344D Nondisplaced fracture of fourth metatarsal bone, right foot, subsequent encounter for fracture with routine healing: Secondary | ICD-10-CM | POA: Diagnosis not present

## 2018-02-10 DIAGNOSIS — S99921D Unspecified injury of right foot, subsequent encounter: Secondary | ICD-10-CM | POA: Diagnosis not present

## 2018-02-10 DIAGNOSIS — M19071 Primary osteoarthritis, right ankle and foot: Secondary | ICD-10-CM | POA: Diagnosis not present

## 2018-02-11 ENCOUNTER — Other Ambulatory Visit (INDEPENDENT_AMBULATORY_CARE_PROVIDER_SITE_OTHER): Payer: PPO

## 2018-02-11 DIAGNOSIS — R739 Hyperglycemia, unspecified: Secondary | ICD-10-CM

## 2018-02-11 DIAGNOSIS — D72819 Decreased white blood cell count, unspecified: Secondary | ICD-10-CM

## 2018-02-11 DIAGNOSIS — E7800 Pure hypercholesterolemia, unspecified: Secondary | ICD-10-CM | POA: Diagnosis not present

## 2018-02-11 LAB — CBC WITH DIFFERENTIAL/PLATELET
BASOS ABS: 0 10*3/uL (ref 0.0–0.1)
Basophils Relative: 0.5 % (ref 0.0–3.0)
EOS ABS: 0.1 10*3/uL (ref 0.0–0.7)
Eosinophils Relative: 2.7 % (ref 0.0–5.0)
HEMATOCRIT: 35.6 % — AB (ref 36.0–46.0)
Hemoglobin: 12.5 g/dL (ref 12.0–15.0)
LYMPHS PCT: 29.2 % (ref 12.0–46.0)
Lymphs Abs: 1.1 10*3/uL (ref 0.7–4.0)
MCHC: 35 g/dL (ref 30.0–36.0)
MCV: 85 fl (ref 78.0–100.0)
MONO ABS: 0.4 10*3/uL (ref 0.1–1.0)
Monocytes Relative: 9.4 % (ref 3.0–12.0)
Neutro Abs: 2.2 10*3/uL (ref 1.4–7.7)
Neutrophils Relative %: 58.2 % (ref 43.0–77.0)
Platelets: 164 10*3/uL (ref 150.0–400.0)
RBC: 4.19 Mil/uL (ref 3.87–5.11)
RDW: 14 % (ref 11.5–15.5)
WBC: 3.9 10*3/uL — AB (ref 4.0–10.5)

## 2018-02-11 LAB — HEPATIC FUNCTION PANEL
ALK PHOS: 50 U/L (ref 39–117)
ALT: 19 U/L (ref 0–35)
AST: 16 U/L (ref 0–37)
Albumin: 3.8 g/dL (ref 3.5–5.2)
BILIRUBIN DIRECT: 0.1 mg/dL (ref 0.0–0.3)
BILIRUBIN TOTAL: 0.4 mg/dL (ref 0.2–1.2)
Total Protein: 5.8 g/dL — ABNORMAL LOW (ref 6.0–8.3)

## 2018-02-11 LAB — HEMOGLOBIN A1C: Hgb A1c MFr Bld: 5.7 % (ref 4.6–6.5)

## 2018-02-12 ENCOUNTER — Encounter: Payer: Self-pay | Admitting: Internal Medicine

## 2018-02-12 LAB — GLUCOSE, FASTING: Glucose, Plasma: 95 mg/dL (ref 65–99)

## 2018-02-16 ENCOUNTER — Other Ambulatory Visit: Payer: Self-pay

## 2018-02-16 ENCOUNTER — Encounter: Payer: Self-pay | Admitting: Internal Medicine

## 2018-02-16 DIAGNOSIS — Z1231 Encounter for screening mammogram for malignant neoplasm of breast: Secondary | ICD-10-CM

## 2018-02-17 ENCOUNTER — Other Ambulatory Visit: Payer: Self-pay

## 2018-02-17 MED ORDER — ROPINIROLE HCL 1 MG PO TABS: ORAL_TABLET | ORAL | 0 refills | Status: DC

## 2018-02-18 ENCOUNTER — Other Ambulatory Visit: Payer: Self-pay | Admitting: Internal Medicine

## 2018-02-20 ENCOUNTER — Other Ambulatory Visit: Payer: Self-pay | Admitting: Internal Medicine

## 2018-02-23 ENCOUNTER — Ambulatory Visit (INDEPENDENT_AMBULATORY_CARE_PROVIDER_SITE_OTHER): Payer: PPO | Admitting: Internal Medicine

## 2018-02-23 ENCOUNTER — Encounter: Payer: Self-pay | Admitting: Internal Medicine

## 2018-02-23 VITALS — BP 140/84 | HR 53 | Temp 97.7°F | Resp 18 | Ht 63.5 in | Wt 182.2 lb

## 2018-02-23 DIAGNOSIS — N6099 Unspecified benign mammary dysplasia of unspecified breast: Secondary | ICD-10-CM

## 2018-02-23 DIAGNOSIS — C439 Malignant melanoma of skin, unspecified: Secondary | ICD-10-CM | POA: Diagnosis not present

## 2018-02-23 DIAGNOSIS — I10 Essential (primary) hypertension: Secondary | ICD-10-CM | POA: Diagnosis not present

## 2018-02-23 DIAGNOSIS — Z23 Encounter for immunization: Secondary | ICD-10-CM | POA: Diagnosis not present

## 2018-02-23 DIAGNOSIS — E7800 Pure hypercholesterolemia, unspecified: Secondary | ICD-10-CM

## 2018-02-23 DIAGNOSIS — D72819 Decreased white blood cell count, unspecified: Secondary | ICD-10-CM

## 2018-02-23 DIAGNOSIS — R739 Hyperglycemia, unspecified: Secondary | ICD-10-CM

## 2018-02-23 DIAGNOSIS — K219 Gastro-esophageal reflux disease without esophagitis: Secondary | ICD-10-CM | POA: Diagnosis not present

## 2018-02-23 MED ORDER — FLUOCINONIDE 0.05 % EX CREA: TOPICAL_CREAM | CUTANEOUS | 0 refills | Status: DC

## 2018-02-23 MED ORDER — PANTOPRAZOLE SODIUM 40 MG PO TBEC: 40.0000 mg | DELAYED_RELEASE_TABLET | Freq: Every day | ORAL | 0 refills | Status: DC

## 2018-02-23 NOTE — Progress Notes (Signed)
Patient ID: Melissa Rhodes, female   DOB: 08-Nov-1950, 67 y.o.   MRN: [id number]   Subjective:    Patient ID: Melissa Rhodes, female    DOB: March 16, 1951, 67 y.o.   MRN: [id number]  HPI  Patient here for a scheduled follow up. Has been seeing ortho for what was originally felt to be a right metatarsal fracture.  Wearing a boot.  Last seen 02/10/18.  Not sure if fracture present.  Recommended boot prn and f/u in 4 weeks.  Still with some pain.  Also evaluated 11/2017 - found to have kidney stones.  Saw urology 12/04/17 - passed stone.  Recommended f/u as needed.  Is s/p removal melanoma - left ear.  Had f/u last week.  Concerned stitch left in place.  No pain.  Healing well.  States she is doing relatively well.  No chest pain.  No sob.  No acid reflux.  States since starting protonix, symptoms resolved.  No abdominal pain.  Bowels moving.  Her foot does not limit her walking, but still with some pain.  She is taking one alleve bid.   States her blood pressures have been averaging 120s/70s.     Past Medical History:  Diagnosis Date  . Arthritis 2009  . 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 Cancer [facility]) 2005   colon  . GERD (gastroesophageal reflux disease)   . Hypertension 2010  . Mammographic microcalcification 2013  . Melanoma ([place]) 2019   left arm  . Mitral valve prolapse 2006  . 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 Obesity, unspecified 2014  . Personal history of colonic polyps 2013  . Renal disorder    kidney stones  . Special screening for malignant neoplasms, colon 2014  . Tubal pregnancy    Past Surgical History:  Procedure Laterality Date  . BREAST BIOPSY Left 2013  . BREAST BIOPSY Right 03/06/2017   LIQ coil clip. FEATURES OF RADIAL SCAR  . BREAST SURGERY Left 2013   re excision of left breast showed small area of residual ADH, but she was not upsatged to DCIS or invasive cancer. The margins were clear.  Patient declined tamoxifen therapy    . COLONOSCOPY  [id number]  . COLONOSCOPY W/ POLYPECTOMY  2014  . COLONOSCOPY WITH PROPOFOL N/A 11/19/2017   Procedure: COLONOSCOPY WITH PROPOFOL;  Surgeon: Robert Bellow, MD;  Location: [facility] ENDOSCOPY;  Service: Endoscopy;  Laterality: N/A;  . ECTOPIC PREGNANCY SURGERY  1981  . ESOPHAGOGASTRODUODENOSCOPY (EGD) WITH PROPOFOL N/A 11/19/2017   Procedure: ESOPHAGOGASTRODUODENOSCOPY (EGD) WITH PROPOFOL;  Surgeon: Robert Bellow, MD;  Location: [facility] ENDOSCOPY;  Service: Endoscopy;  Laterality: N/A;  . kidney stones  2009  . SHOULDER SURGERY Right 2007  . TUBAL LIGATION  1981   Family History  Problem Relation Age of Onset  . Cancer Father 63       lung  . Early death Father   . Arthritis Mother   . Depression Mother   . Diabetes Mother   . Heart disease Mother   . Hypertension Mother   . Hyperlipidemia Mother   . Stroke Mother   . Depression Sister   . Diabetes Sister   . Hyperlipidemia Sister   . Hypertension Sister   . Depression Sister   . Hyperlipidemia Sister   . Hypertension Sister   . Miscarriages / Stillbirths Sister   . Depression Sister   . Hyperlipidemia Sister   . Hypertension Sister   .  Depression Brother   . Diabetes Brother   . Hyperlipidemia Brother   . Hypertension Brother   . Stroke Brother   . Breast cancer Neg Hx   . Ovarian cancer Neg Hx    Social History   Socioeconomic History  . Marital status: Married    Spouse name: Not on file  . Number of children: Not on file  . Years of education: Not on file  . Highest education level: Not on file  Occupational History  . Not on file  Social Needs  . Financial resource strain: Not on file  . Food insecurity:    Worry: Not on file    Inability: Not on file  . Transportation needs:    Medical: Not on file    Non-medical: Not on file  Tobacco Use  . Smoking status: Never Smoker  . Smokeless tobacco: Never Used  Substance and Sexual Activity  . Alcohol use: No  . Drug use: No  . Sexual  activity: Never  Lifestyle  . Physical activity:    Days per week: Not on file    Minutes per session: Not on file  . Stress: Not on file  Relationships  . Social connections:    Talks on phone: Not on file    Gets together: Not on file    Attends religious service: Not on file    Active member of club or organization: Not on file    Attends meetings of clubs or organizations: Not on file    Relationship status: Not on file  Other Topics Concern  . Not on file  Social History Narrative  . Not on file    Outpatient Encounter Medications as of 02/23/2018  Medication Sig  . anastrozole (ARIMIDEX) 1 MG tablet Take 1 mg by mouth daily.   Marland Kitchen aspirin 81 MG tablet Take 81 mg by mouth daily.   . clonazePAM (KLONOPIN) 0.5 MG tablet Take 1 tablet (0.5 mg total) by mouth daily.  . fluocinonide cream (LIDEX) 0.05 % Use as directed  . hydrochlorothiazide (HYDRODIURIL) 25 MG tablet Take 1 tablet (25 mg total) by mouth daily.  Marland Kitchen lisinopril (PRINIVIL,ZESTRIL) 10 MG tablet Take 1 tablet (10 mg total) by mouth daily.  Marland Kitchen lisinopril (PRINIVIL,ZESTRIL) 10 MG tablet TAKE 1 TABLET(10 MG) BY MOUTH DAILY  . pantoprazole (PROTONIX) 40 MG tablet Take 1 tablet (40 mg total) by mouth daily. Take 30 minutes before your evening meal  . potassium chloride (K-DUR,KLOR-CON) 10 MEQ tablet Take 1 tablet (10 mEq total) by mouth 2 (two) times daily.  Marland Kitchen rOPINIRole (REQUIP) 1 MG tablet TAKE 1 TABLET(1 MG) BY MOUTH AT BEDTIME  . rosuvastatin (CRESTOR) 20 MG tablet TAKE 1 TABLET(20 MG) BY MOUTH DAILY  . rosuvastatin (CRESTOR) 20 MG tablet TAKE 1 TABLET(20 MG) BY MOUTH DAILY  . [DISCONTINUED] fluocinonide cream (LIDEX) 1.19 % Apply 1 application topically 2 (two) times daily.   . [DISCONTINUED] pantoprazole (PROTONIX) 40 MG tablet Take 1 tablet (40 mg total) by mouth daily. Take 30 minutes before your evening meal   No facility-administered encounter medications on file as of 02/23/2018.     Review of Systems    Constitutional: Negative for appetite change and unexpected weight change.  HENT: Negative for congestion and sinus pressure.   Respiratory: Negative for cough, chest tightness and shortness of breath.   Cardiovascular: Negative for chest pain, palpitations and leg swelling.  Gastrointestinal: Negative for abdominal pain, diarrhea, nausea and vomiting.  Genitourinary: Negative for difficulty urinating  and dysuria.  Musculoskeletal: Negative for myalgias.       Foot pain as outlined.    Skin: Negative for color change and rash.  Neurological: Negative for dizziness, light-headedness and headaches.  Psychiatric/Behavioral: Negative for agitation and dysphoric mood.       Objective:    Physical Exam  Constitutional: She appears well-developed and well-nourished. No distress.  HENT:  Nose: Nose normal.  Mouth/Throat: Oropharynx is clear and moist.  Neck: Neck supple. No thyromegaly present.  Cardiovascular: Normal rate and regular rhythm.  Pulmonary/Chest: Breath sounds normal. No respiratory distress. She has no wheezes.  Abdominal: Soft. Bowel sounds are normal. There is no tenderness.  Musculoskeletal: She exhibits no edema or tenderness.  Lymphadenopathy:    She has no cervical adenopathy.  Skin: No rash noted. No erythema.  Psychiatric: She has a normal mood and affect. Her behavior is normal.    BP 140/84 (BP Location: Left Arm, Patient Position: Sitting, Cuff Size: Large)   Pulse (!) 53   Temp 97.7 F (36.5 C) (Oral)   Resp 18   Ht 5' 3.5" (1.613 m)   Wt 182 lb 4 oz (82.7 kg)   SpO2 98%   BMI 31.78 kg/m  Wt Readings from Last 3 Encounters:  02/23/18 182 lb 4 oz (82.7 kg)  12/04/17 183 lb (83 kg)  11/25/17 187 lb 8 oz (85 kg)     Lab Results  Component Value Date   WBC 3.9 (L) 02/11/2018   HGB 12.5 02/11/2018   HCT 35.6 (L) 02/11/2018   PLT 164.0 02/11/2018   GLUCOSE 113 (H) 12/17/2017   CHOL 203 (H) 12/17/2017   TRIG 80.0 12/17/2017   HDL 53.10  12/17/2017   LDLCALC 134 (H) 12/17/2017   ALT 19 02/11/2018   AST 16 02/11/2018   NA 143 12/17/2017   K 4.2 12/17/2017   CL 107 12/17/2017   CREATININE 0.77 12/17/2017   BUN 19 12/17/2017   CO2 31 12/17/2017   TSH 4.12 07/09/2017   HGBA1C 5.7 02/11/2018    Ct Renal Stone Study  Result Date: 11/22/2017 CLINICAL DATA:  Difficulty urinating, RIGHT flank pain radiating to abdomen. Macroscopic hematuria. History of kidney stones with similar symptoms, colon cancer. EXAM: CT ABDOMEN AND PELVIS WITHOUT CONTRAST TECHNIQUE: Multidetector CT imaging of the abdomen and pelvis was performed following the standard protocol without IV contrast. COMPARISON:  None. FINDINGS: LOWER CHEST: Lung bases are clear. The visualized heart size is normal. No pericardial effusion. HEPATOBILIARY: Scattered subcentimeter hypodensities throughout the liver most compatible with cysts. 3 cm cyst segment 6 of liver. Multiple gallstones measuring to 2.7 cm without CT findings of acute cholecystitis. PANCREAS: Normal. SPLEEN: Normal. ADRENALS/URINARY TRACT: Kidneys are orthotopic, demonstrating normal size and morphology. Punctate RIGHT nephrolithiasis. Mild RIGHT hydroureteronephrosis to the level of the ureterovesicular junction were 3 mm calculus is present. Limited assessment for renal masses by nonenhanced CT. 2 cm cyst RIGHT interpolar kidney. Urinary bladder is partially distended and unremarkable. 4.5 cm homogeneously hypodense benign-appearing LEFT adrenal adenoma with punctate calcifications. STOMACH/BOWEL: Small hiatal hernia. The stomach, small and large bowel are normal in course and caliber without inflammatory changes, sensitivity decreased by lack of enteric contrast. Normal appendix. VASCULAR/LYMPHATIC: Aortoiliac vessels are normal in course and caliber. Mild calcific atherosclerosis. No lymphadenopathy by CT size criteria. REPRODUCTIVE: Normal. OTHER: No intraperitoneal free fluid or free air. MUSCULOSKELETAL:  Non-acute. Severe pubic symphyseal osteoarthrosis. Minimal grade 1 L4-5 anterolisthesis on a degenerative basis. Moderate to severe lower lumbar facet arthropathy.  Small fat containing umbilical hernia. IMPRESSION: 1. 3 mm RIGHT ureterovesicular junction calculus resulting in mild obstructive uropathy. 2. Punctate RIGHT nephrolithiasis. Aortic Atherosclerosis (ICD10-I70.0). Electronically Signed   By: Elon Alas M.D.   On: 11/22/2017 06:46       Assessment & Plan:   Problem List Items Addressed This Visit    Atypical ductal hyperplasia of breast    On arimidex.  Followed by oncology.        GERD (gastroesophageal reflux disease)    On protonix.  Symptoms resolved.  Will continue for a while longer given taking alleve for her foot.  Follow.        Relevant Medications   pantoprazole (PROTONIX) 40 MG tablet   Hypercholesterolemia    On crestor.  Low cholesterol diet and exercise.  Follow lipid panel and liver function tests.   Lab Results  Component Value Date   CHOL 203 (H) 12/17/2017   HDL 53.10 12/17/2017   LDLCALC 134 (H) 12/17/2017   TRIG 80.0 12/17/2017   CHOLHDL 4 12/17/2017        Relevant Orders   Hepatic function panel   Lipid panel   Hyperglycemia    Low carb diet and exercise.  Follow met b and a1c.  Last a1c 5.7.        Relevant Orders   Hemoglobin A1c   Hypertension    On lisinopril.  Blood pressure on recheck today - improved.  Her checks averaging 120s/70s.  Continue same medication regimen.  Follow pressures.  Follow metabolic panel.        Relevant Orders   TSH   Basic metabolic panel   Melanoma of skin ([place])    Recent surgery - left ear.  Healing.  Small scab posterior ear.  No erythema.  Follow.         Other Visit Diagnoses    Need for influenza vaccination    -  Primary   Relevant Orders   Flu vaccine HIGH DOSE PF (Fluzone High dose) (Completed)   Leukopenia, unspecified type       Slightly decreased white blood cell count.  Will  recheck cbc with next labs.    Relevant Orders   CBC with Differential/Platelet       Einar Pheasant, MD

## 2018-02-24 ENCOUNTER — Encounter: Payer: Self-pay | Admitting: Internal Medicine

## 2018-02-24 DIAGNOSIS — C439 Malignant melanoma of skin, unspecified: Secondary | ICD-10-CM | POA: Insufficient documentation

## 2018-02-24 NOTE — Assessment & Plan Note (Signed)
On crestor.  Low cholesterol diet and exercise.  Follow lipid panel and liver function tests.   Lab Results  Component Value Date   CHOL 203 (H) 12/17/2017   HDL 53.10 12/17/2017   LDLCALC 134 (H) 12/17/2017   TRIG 80.0 12/17/2017   CHOLHDL 4 12/17/2017

## 2018-02-24 NOTE — Assessment & Plan Note (Signed)
Recent surgery - left ear.  Healing.  Small scab posterior ear.  No erythema.  Follow.

## 2018-02-24 NOTE — Assessment & Plan Note (Signed)
Low carb diet and exercise.  Follow met b and a1c.  Last a1c 5.7.

## 2018-02-24 NOTE — Assessment & Plan Note (Signed)
On protonix.  Symptoms resolved.  Will continue for a while longer given taking alleve for her foot.  Follow.

## 2018-02-24 NOTE — Assessment & Plan Note (Signed)
On lisinopril.  Blood pressure on recheck today - improved.  Her checks averaging 120s/70s.  Continue same medication regimen.  Follow pressures.  Follow metabolic panel.

## 2018-02-24 NOTE — Assessment & Plan Note (Signed)
On arimidex. Followed by oncology.  

## 2018-02-25 ENCOUNTER — Telehealth: Payer: Self-pay

## 2018-02-25 ENCOUNTER — Encounter: Payer: Self-pay | Admitting: General Surgery

## 2018-02-25 NOTE — Progress Notes (Signed)
The patient was seen on March 12, 2017 regarding a stereotactic biopsy showing a 9 mm radial scar.  This patient subsequently went to [facility] for a second opinion and underwent excisional biopsy, the report of which is attached below: Ordering Location: [facility] Received:05/06/2017 8333 Pathologist: Fae Pippin, MD Specimen:Breast, Right, right breast savi localized excision. 7:00, 5CFN    Final Diagnosis A: Breast, right, savi scout localized lumpectomy  -Intraductal papilloma with usual ductal hyperplasia and apocrine metaplasia -Columnar cell change -Sclerosing adenosis -Dense stromal fibrosis -Radial scar -Biopsy site changes -No atypia, in situ or invasive carcinoma identified   Clinical History 67 year old female with biopsy showing a 2.2 cm architectural distortion of the right breast, inferior middle     Patient is being managed through [facility] for chemoprevention.   The patient reports she will continue follow-up with [facility] for her breast disease.

## 2018-02-25 NOTE — Telephone Encounter (Signed)
Patient called back and states that she will be going with her provider at [facility] for mammograms and breast follow up. She is very appreciative of [place] for calling. She will call if she has need of our services in the future.

## 2018-02-25 NOTE — Telephone Encounter (Signed)
Call to patient to inquire at to weather she wants to follow up with Dr Bary Castilla for mammograms and breast exam or not. Unable to leave a voicemail.

## 2018-03-11 ENCOUNTER — Encounter: Payer: Self-pay | Admitting: Internal Medicine

## 2018-03-20 ENCOUNTER — Encounter: Payer: Self-pay | Admitting: Internal Medicine

## 2018-03-23 NOTE — Telephone Encounter (Signed)
Anything on this pa,

## 2018-03-24 NOTE — Telephone Encounter (Signed)
Still have not received. Will call again

## 2018-03-26 NOTE — Telephone Encounter (Signed)
PA submitted. Awaiting response. Pt aware

## 2018-04-03 ENCOUNTER — Encounter: Payer: Self-pay | Admitting: Internal Medicine

## 2018-04-03 NOTE — Telephone Encounter (Signed)
Called patient. Unable to leave message due to the mailbox being full.

## 2018-04-03 NOTE — Telephone Encounter (Signed)
From what I can see, it appears to be a subconjunctival hemorrhage.  There would not be anything more to do if it is a subconjunctival hemorrhage.  The blood will reabsorb.  Any pain in the eye?  Any change in vision?  To be safe, I would recommend evaluation to confirm diagnosis.

## 2018-04-03 NOTE — Telephone Encounter (Signed)
Pt returning call to [place]. Please advise.

## 2018-04-03 NOTE — Telephone Encounter (Signed)
No pain, no vision change. Pt has made an eye dr appt.

## 2018-04-09 ENCOUNTER — Other Ambulatory Visit: Payer: Self-pay

## 2018-04-09 ENCOUNTER — Encounter: Payer: Self-pay | Admitting: Internal Medicine

## 2018-04-09 NOTE — Progress Notes (Signed)
Last OV 02/23/2018 Next OV 05/26/2018 Last refill 02/03/2018 #30 with 1 refill

## 2018-04-10 MED ORDER — CLONAZEPAM 0.5 MG PO TABS: 0.5000 mg | ORAL_TABLET | Freq: Every day | ORAL | 1 refills | Status: DC

## 2018-04-10 NOTE — Progress Notes (Signed)
rx ok'd for clonazepam #30 with 1 refills.

## 2018-04-13 ENCOUNTER — Other Ambulatory Visit: Payer: Self-pay | Admitting: Internal Medicine

## 2018-04-14 DIAGNOSIS — H2513 Age-related nuclear cataract, bilateral: Secondary | ICD-10-CM | POA: Diagnosis not present

## 2018-04-24 DIAGNOSIS — Z85038 Personal history of other malignant neoplasm of large intestine: Secondary | ICD-10-CM | POA: Diagnosis not present

## 2018-04-24 DIAGNOSIS — K219 Gastro-esophageal reflux disease without esophagitis: Secondary | ICD-10-CM | POA: Diagnosis not present

## 2018-04-24 DIAGNOSIS — N6099 Unspecified benign mammary dysplasia of unspecified breast: Secondary | ICD-10-CM | POA: Diagnosis not present

## 2018-04-24 DIAGNOSIS — N6092 Unspecified benign mammary dysplasia of left breast: Secondary | ICD-10-CM | POA: Diagnosis not present

## 2018-04-24 DIAGNOSIS — I10 Essential (primary) hypertension: Secondary | ICD-10-CM | POA: Diagnosis not present

## 2018-04-24 DIAGNOSIS — R922 Inconclusive mammogram: Secondary | ICD-10-CM | POA: Diagnosis not present

## 2018-04-24 DIAGNOSIS — Z79811 Long term (current) use of aromatase inhibitors: Secondary | ICD-10-CM | POA: Diagnosis not present

## 2018-04-29 DIAGNOSIS — Z09 Encounter for follow-up examination after completed treatment for conditions other than malignant neoplasm: Secondary | ICD-10-CM | POA: Diagnosis not present

## 2018-04-29 DIAGNOSIS — Z872 Personal history of diseases of the skin and subcutaneous tissue: Secondary | ICD-10-CM | POA: Diagnosis not present

## 2018-04-29 DIAGNOSIS — Z08 Encounter for follow-up examination after completed treatment for malignant neoplasm: Secondary | ICD-10-CM | POA: Diagnosis not present

## 2018-04-29 DIAGNOSIS — Z85820 Personal history of malignant melanoma of skin: Secondary | ICD-10-CM | POA: Diagnosis not present

## 2018-04-29 DIAGNOSIS — D2361 Other benign neoplasm of skin of right upper limb, including shoulder: Secondary | ICD-10-CM | POA: Diagnosis not present

## 2018-05-12 ENCOUNTER — Encounter: Payer: Self-pay | Admitting: Internal Medicine

## 2018-05-12 ENCOUNTER — Other Ambulatory Visit: Payer: Self-pay

## 2018-05-12 MED ORDER — POTASSIUM CHLORIDE CRYS ER 10 MEQ PO TBCR: EXTENDED_RELEASE_TABLET | ORAL | 1 refills | Status: DC

## 2018-05-12 NOTE — Telephone Encounter (Signed)
Rx sent in. Pt aware

## 2018-05-18 ENCOUNTER — Other Ambulatory Visit: Payer: Self-pay | Admitting: Internal Medicine

## 2018-05-22 ENCOUNTER — Other Ambulatory Visit: Payer: Self-pay | Admitting: Internal Medicine

## 2018-05-25 ENCOUNTER — Other Ambulatory Visit (INDEPENDENT_AMBULATORY_CARE_PROVIDER_SITE_OTHER): Payer: PPO

## 2018-05-25 DIAGNOSIS — I10 Essential (primary) hypertension: Secondary | ICD-10-CM

## 2018-05-25 DIAGNOSIS — E7800 Pure hypercholesterolemia, unspecified: Secondary | ICD-10-CM | POA: Diagnosis not present

## 2018-05-25 DIAGNOSIS — D72819 Decreased white blood cell count, unspecified: Secondary | ICD-10-CM | POA: Diagnosis not present

## 2018-05-25 DIAGNOSIS — R739 Hyperglycemia, unspecified: Secondary | ICD-10-CM

## 2018-05-25 LAB — LIPID PANEL
CHOLESTEROL: 136 mg/dL (ref 0–200)
HDL: 49.3 mg/dL (ref >39.00)
LDL Cholesterol: 73 mg/dL (ref 0–99)
NonHDL: 86.89
Total CHOL/HDL Ratio: 3
Triglycerides: 68 mg/dL (ref 0.0–149.0)
VLDL: 13.6 mg/dL (ref 0.0–40.0)

## 2018-05-25 LAB — CBC WITH DIFFERENTIAL/PLATELET
Basophils Absolute: 0 10*3/uL (ref 0.0–0.1)
Basophils Relative: 0.4 % (ref 0.0–3.0)
Eosinophils Absolute: 0.1 10*3/uL (ref 0.0–0.7)
Eosinophils Relative: 2.6 % (ref 0.0–5.0)
HCT: 38.3 % (ref 36.0–46.0)
Hemoglobin: 12.9 g/dL (ref 12.0–15.0)
Lymphocytes Relative: 31.2 % (ref 12.0–46.0)
Lymphs Abs: 1.4 10*3/uL (ref 0.7–4.0)
MCHC: 33.6 g/dL (ref 30.0–36.0)
MCV: 86 fl (ref 78.0–100.0)
Monocytes Absolute: 0.4 10*3/uL (ref 0.1–1.0)
Monocytes Relative: 9.2 % (ref 3.0–12.0)
NEUTROS ABS: 2.5 10*3/uL (ref 1.4–7.7)
Neutrophils Relative %: 56.6 % (ref 43.0–77.0)
Platelets: 200 10*3/uL (ref 150.0–400.0)
RBC: 4.45 Mil/uL (ref 3.87–5.11)
RDW: 13.8 % (ref 11.5–15.5)
WBC: 4.3 10*3/uL (ref 4.0–10.5)

## 2018-05-25 LAB — HEPATIC FUNCTION PANEL
ALK PHOS: 66 U/L (ref 39–117)
ALT: 17 U/L (ref 0–35)
AST: 15 U/L (ref 0–37)
Albumin: 3.9 g/dL (ref 3.5–5.2)
Bilirubin, Direct: 0.1 mg/dL (ref 0.0–0.3)
TOTAL PROTEIN: 6 g/dL (ref 6.0–8.3)
Total Bilirubin: 0.3 mg/dL (ref 0.2–1.2)

## 2018-05-25 LAB — BASIC METABOLIC PANEL
BUN: 19 mg/dL (ref 6–23)
CO2: 29 meq/L (ref 19–32)
CREATININE: 0.65 mg/dL (ref 0.40–1.20)
Calcium: 8.9 mg/dL (ref 8.4–10.5)
Chloride: 106 mEq/L (ref 96–112)
GFR: 96.37 mL/min (ref >60.00)
Glucose, Bld: 98 mg/dL (ref 70–99)
Potassium: 3.4 mEq/L — ABNORMAL LOW (ref 3.5–5.1)
SODIUM: 141 meq/L (ref 135–145)

## 2018-05-25 LAB — TSH: TSH: 3.92 u[IU]/mL (ref 0.35–4.50)

## 2018-05-25 LAB — HEMOGLOBIN A1C: Hgb A1c MFr Bld: 5.9 % (ref 4.6–6.5)

## 2018-05-26 ENCOUNTER — Ambulatory Visit (INDEPENDENT_AMBULATORY_CARE_PROVIDER_SITE_OTHER): Payer: PPO | Admitting: Internal Medicine

## 2018-05-26 ENCOUNTER — Encounter: Payer: Self-pay | Admitting: Internal Medicine

## 2018-05-26 ENCOUNTER — Telehealth: Payer: Self-pay

## 2018-05-26 VITALS — BP 132/80 | HR 74 | Temp 98.2°F | Resp 16 | Ht 64.0 in | Wt 181.6 lb

## 2018-05-26 DIAGNOSIS — R739 Hyperglycemia, unspecified: Secondary | ICD-10-CM | POA: Diagnosis not present

## 2018-05-26 DIAGNOSIS — J069 Acute upper respiratory infection, unspecified: Secondary | ICD-10-CM | POA: Diagnosis not present

## 2018-05-26 DIAGNOSIS — C439 Malignant melanoma of skin, unspecified: Secondary | ICD-10-CM | POA: Diagnosis not present

## 2018-05-26 DIAGNOSIS — E7800 Pure hypercholesterolemia, unspecified: Secondary | ICD-10-CM | POA: Diagnosis not present

## 2018-05-26 DIAGNOSIS — K219 Gastro-esophageal reflux disease without esophagitis: Secondary | ICD-10-CM | POA: Diagnosis not present

## 2018-05-26 DIAGNOSIS — N6099 Unspecified benign mammary dysplasia of unspecified breast: Secondary | ICD-10-CM | POA: Diagnosis not present

## 2018-05-26 DIAGNOSIS — I10 Essential (primary) hypertension: Secondary | ICD-10-CM

## 2018-05-26 DIAGNOSIS — Z0000 Encounter for general adult medical examination without abnormal findings: Secondary | ICD-10-CM

## 2018-05-26 MED ORDER — PREDNISONE 10 MG PO TABS: ORAL_TABLET | ORAL | 0 refills | Status: DC

## 2018-05-26 MED ORDER — POTASSIUM CHLORIDE ER 10 MEQ PO CPCR: 20.0000 meq | ORAL_CAPSULE | Freq: Every day | ORAL | 1 refills | Status: DC

## 2018-05-26 NOTE — Progress Notes (Signed)
Patient ID: Melissa Rhodes, female   DOB: Jul 09, 1950, 67 y.o.   MRN: [id number]   Subjective:    Patient ID: Melissa Rhodes, female    DOB: 1950/10/20, 67 y.o.   MRN: [id number]  HPI  Patient here for her physical exam.  Sees Dr Webb Silversmith for f/u atypical ductal hyperplasia of breast.  On anastrozole since 06/2017.  Had mammogram 04/24/18 - Birads II.  Tries to stay active.  No chest pain.  Reports that one week ago, developed sore throat.  No sinus pressure.  Ears stopped up.  Increased drainage.  Increased cough.  Some coughing fits.  No acid reflux.  No abdominal pain.  Bowels moving.  Saw urology.  Following for kidney stone.  Saw Dr Kenton Kingfisher.  Had pap/physical 01/2017.  Asked to change potassium supplement.  Desires smaller tablets.     Past Medical History:  Diagnosis Date  . Arthritis 2009  . 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 Cancer [facility]) 2005   colon  . GERD (gastroesophageal reflux disease)   . Hypertension 2010  . Mammographic microcalcification 2013  . Melanoma ([place]) 2019   left arm  . Mitral valve prolapse 2006  . 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 Obesity, unspecified 2014  . Personal history of colonic polyps 2013  . Renal disorder    kidney stones  . Special screening for malignant neoplasms, colon 2014  . Tubal pregnancy    Past Surgical History:  Procedure Laterality Date  . BREAST BIOPSY Left 2013  . BREAST BIOPSY Right 03/06/2017   LIQ coil clip. FEATURES OF RADIAL SCAR  . BREAST SURGERY Left 2013   re excision of left breast showed small area of residual ADH, but she was not upsatged to DCIS or invasive cancer. The margins were clear.  Patient declined tamoxifen therapy  . COLONOSCOPY  [id number]  . COLONOSCOPY W/ POLYPECTOMY  2014  . COLONOSCOPY WITH PROPOFOL N/A 11/19/2017   Procedure: COLONOSCOPY WITH PROPOFOL;  Surgeon: Robert Bellow, MD;  Location: [facility] ENDOSCOPY;  Service: Endoscopy;  Laterality: N/A;  .  ECTOPIC PREGNANCY SURGERY  1981  . ESOPHAGOGASTRODUODENOSCOPY (EGD) WITH PROPOFOL N/A 11/19/2017   Procedure: ESOPHAGOGASTRODUODENOSCOPY (EGD) WITH PROPOFOL;  Surgeon: Robert Bellow, MD;  Location: [facility] ENDOSCOPY;  Service: Endoscopy;  Laterality: N/A;  . kidney stones  2009  . SHOULDER SURGERY Right 2007  . TUBAL LIGATION  1981   Family History  Problem Relation Age of Onset  . Cancer Father 64       lung  . Early death Father   . Arthritis Mother   . Depression Mother   . Diabetes Mother   . Heart disease Mother   . Hypertension Mother   . Hyperlipidemia Mother   . Stroke Mother   . Depression Sister   . Diabetes Sister   . Hyperlipidemia Sister   . Hypertension Sister   . Depression Sister   . Hyperlipidemia Sister   . Hypertension Sister   . Miscarriages / Stillbirths Sister   . Depression Sister   . Hyperlipidemia Sister   . Hypertension Sister   . Depression Brother   . Diabetes Brother   . Hyperlipidemia Brother   . Hypertension Brother   . Stroke Brother   . Breast cancer Neg Hx   . Ovarian cancer Neg Hx    Social History   Socioeconomic History  . Marital status: Married    Spouse name: Not  on file  . Number of children: Not on file  . Years of education: Not on file  . Highest education level: Not on file  Occupational History  . Not on file  Social Needs  . Financial resource strain: Not on file  . Food insecurity:    Worry: Not on file    Inability: Not on file  . Transportation needs:    Medical: Not on file    Non-medical: Not on file  Tobacco Use  . Smoking status: Never Smoker  . Smokeless tobacco: Never Used  Substance and Sexual Activity  . Alcohol use: No  . Drug use: No  . Sexual activity: Never  Lifestyle  . Physical activity:    Days per week: Not on file    Minutes per session: Not on file  . Stress: Not on file  Relationships  . Social connections:    Talks on phone: Not on file    Gets together: Not on file     Attends religious service: Not on file    Active member of club or organization: Not on file    Attends meetings of clubs or organizations: Not on file    Relationship status: Not on file  Other Topics Concern  . Not on file  Social History Narrative  . Not on file    Outpatient Encounter Medications as of 05/26/2018  Medication Sig  . anastrozole (ARIMIDEX) 1 MG tablet Take 1 mg by mouth daily.   Marland Kitchen aspirin 81 MG tablet Take 81 mg by mouth daily.   . clonazePAM (KLONOPIN) 0.5 MG tablet Take 1 tablet (0.5 mg total) by mouth daily.  . fluocinonide cream (LIDEX) 0.05 % Use as directed  . hydrochlorothiazide (HYDRODIURIL) 25 MG tablet Take 1 tablet (25 mg total) by mouth daily.  Marland Kitchen lisinopril (PRINIVIL,ZESTRIL) 10 MG tablet TAKE 1 TABLET(10 MG) BY MOUTH DAILY  . pantoprazole (PROTONIX) 40 MG tablet TAKE 1 TABLET BY MOUTH EVERY DAY 30 MINUTES BEFORE YOUR EVENING MEAL  . rOPINIRole (REQUIP) 1 MG tablet TAKE 1 TABLET(1 MG) BY MOUTH AT BEDTIME  . rosuvastatin (CRESTOR) 20 MG tablet TAKE 1 TABLET(20 MG) BY MOUTH DAILY  . [DISCONTINUED] potassium chloride (K-DUR,KLOR-CON) 10 MEQ tablet TAKE 1 TABLET(10 MEQ) BY MOUTH TWICE DAILY  . predniSONE (DELTASONE) 10 MG tablet Take 4 tablets x 1 day and then decrease by 1/2 tablet per day until down to zero mg.  . [DISCONTINUED] lisinopril (PRINIVIL,ZESTRIL) 10 MG tablet Take 1 tablet (10 mg total) by mouth daily.  . [DISCONTINUED] rosuvastatin (CRESTOR) 20 MG tablet TAKE 1 TABLET(20 MG) BY MOUTH DAILY   No facility-administered encounter medications on file as of 05/26/2018.     Review of Systems  Constitutional: Negative for appetite change and unexpected weight change.  HENT: Positive for congestion and sore throat. Negative for sinus pressure.   Eyes: Negative for pain and visual disturbance.  Respiratory: Positive for cough. Negative for chest tightness and shortness of breath.   Cardiovascular: Negative for chest pain, palpitations and leg  swelling.  Gastrointestinal: Negative for abdominal pain, diarrhea, nausea and vomiting.  Genitourinary: Negative for difficulty urinating and dysuria.  Musculoskeletal: Negative for joint swelling and myalgias.  Skin: Negative for color change and rash.  Neurological: Negative for dizziness, light-headedness and headaches.  Hematological: Negative for adenopathy. Does not bruise/bleed easily.  Psychiatric/Behavioral: Negative for agitation and dysphoric mood.       Objective:    Physical Exam Constitutional:      General:  She is not in acute distress.    Appearance: Normal appearance. She is well-developed.  HENT:     Nose: Nose normal. No congestion.     Mouth/Throat:     Pharynx: No oropharyngeal exudate or posterior oropharyngeal erythema.  Eyes:     General: No scleral icterus.       Right eye: No discharge.        Left eye: No discharge.  Neck:     Musculoskeletal: Neck supple.     Thyroid: No thyromegaly.  Cardiovascular:     Rate and Rhythm: Normal rate and regular rhythm.  Pulmonary:     Effort: No tachypnea, accessory muscle usage or respiratory distress.     Breath sounds: Normal breath sounds. No decreased breath sounds or wheezing.     Comments: Increased cough with forced expiration.   Chest:     Breasts:        Right: No inverted nipple, mass, nipple discharge or tenderness (no axillary adenopathy).        Left: No inverted nipple, mass, nipple discharge or tenderness (no axilarry adenopathy).  Abdominal:     General: Bowel sounds are normal.     Palpations: Abdomen is soft.     Tenderness: There is no abdominal tenderness.  Musculoskeletal:        General: No swelling or tenderness.  Lymphadenopathy:     Cervical: No cervical adenopathy.  Skin:    Findings: No erythema or rash.  Neurological:     Mental Status: She is alert and oriented to person, place, and time.  Psychiatric:        Mood and Affect: Mood normal.        Behavior: Behavior normal.       BP 132/80 (BP Location: Left Arm, Patient Position: Sitting, Cuff Size: Normal)   Pulse 74   Temp 98.2 F (36.8 C) (Oral)   Resp 16   Ht '5\' 4"'$  (1.626 m)   Wt 181 lb 9.6 oz (82.4 kg)   SpO2 98%   BMI 31.17 kg/m  Wt Readings from Last 3 Encounters:  05/26/18 181 lb 9.6 oz (82.4 kg)  02/23/18 182 lb 4 oz (82.7 kg)  12/04/17 183 lb (83 kg)     Lab Results  Component Value Date   WBC 4.3 05/25/2018   HGB 12.9 05/25/2018   HCT 38.3 05/25/2018   PLT 200.0 05/25/2018   GLUCOSE 98 05/25/2018   CHOL 136 05/25/2018   TRIG 68.0 05/25/2018   HDL 49.30 05/25/2018   LDLCALC 73 05/25/2018   ALT 17 05/25/2018   AST 15 05/25/2018   NA 141 05/25/2018   K 3.4 (L) 05/25/2018   CL 106 05/25/2018   CREATININE 0.65 05/25/2018   BUN 19 05/25/2018   CO2 29 05/25/2018   TSH 3.92 05/25/2018   HGBA1C 5.9 05/25/2018    Ct Renal Stone Study  Result Date: 11/22/2017 CLINICAL DATA:  Difficulty urinating, RIGHT flank pain radiating to abdomen. Macroscopic hematuria. History of kidney stones with similar symptoms, colon cancer. EXAM: CT ABDOMEN AND PELVIS WITHOUT CONTRAST TECHNIQUE: Multidetector CT imaging of the abdomen and pelvis was performed following the standard protocol without IV contrast. COMPARISON:  None. FINDINGS: LOWER CHEST: Lung bases are clear. The visualized heart size is normal. No pericardial effusion. HEPATOBILIARY: Scattered subcentimeter hypodensities throughout the liver most compatible with cysts. 3 cm cyst segment 6 of liver. Multiple gallstones measuring to 2.7 cm without CT findings of acute cholecystitis. PANCREAS: Normal. SPLEEN: Normal.  ADRENALS/URINARY TRACT: Kidneys are orthotopic, demonstrating normal size and morphology. Punctate RIGHT nephrolithiasis. Mild RIGHT hydroureteronephrosis to the level of the ureterovesicular junction were 3 mm calculus is present. Limited assessment for renal masses by nonenhanced CT. 2 cm cyst RIGHT interpolar kidney. Urinary bladder is  partially distended and unremarkable. 4.5 cm homogeneously hypodense benign-appearing LEFT adrenal adenoma with punctate calcifications. STOMACH/BOWEL: Small hiatal hernia. The stomach, small and large bowel are normal in course and caliber without inflammatory changes, sensitivity decreased by lack of enteric contrast. Normal appendix. VASCULAR/LYMPHATIC: Aortoiliac vessels are normal in course and caliber. Mild calcific atherosclerosis. No lymphadenopathy by CT size criteria. REPRODUCTIVE: Normal. OTHER: No intraperitoneal free fluid or free air. MUSCULOSKELETAL: Non-acute. Severe pubic symphyseal osteoarthrosis. Minimal grade 1 L4-5 anterolisthesis on a degenerative basis. Moderate to severe lower lumbar facet arthropathy. Small fat containing umbilical hernia. IMPRESSION: 1. 3 mm RIGHT ureterovesicular junction calculus resulting in mild obstructive uropathy. 2. Punctate RIGHT nephrolithiasis. Aortic Atherosclerosis (ICD10-I70.0). Electronically Signed   By: Elon Alas M.D.   On: 11/22/2017 06:46       Assessment & Plan:   Problem List Items Addressed This Visit    Atypical ductal hyperplasia of breast    Followed by oncology.  On arimidex.        GERD (gastroesophageal reflux disease)    Symptoms controlled on protonix.  Follow.       Healthcare maintenance    Physical today 05/26/18.  PAP 01/2017 Colonoscopy 11/2017 - rectal polyp.        Hypercholesterolemia    On crestor.  Low cholesterol diet and exercise.  Follow lipid panel and liver function tests.        Relevant Orders   Hepatic function panel   Lipid panel   Hyperglycemia    Low carb diet and exercise.  Follow met b and a1c.        Relevant Orders   Hemoglobin F7P   Basic metabolic panel   Hypertension    Blood pressure under good control.  Continue same medication regimen.  Follow pressures.  Follow metabolic panel.        Melanoma of skin ([place])    Followed by dermatology.        Relevant Medications    predniSONE (DELTASONE) 10 MG tablet   URI (upper respiratory infection)    Increased cough and congestion.  Persistent cough.  Treat with nasal spray and mucinex/robitussin as directed.  Prednisone taper as directed.  Follow.         Other Visit Diagnoses    Routine general medical examination at a health care facility    -  Primary       Einar Pheasant, MD

## 2018-05-26 NOTE — Patient Instructions (Signed)
Saline nasal spray - flush nose at least 2-3x/day  nasacort nasal spry - 2 sprays each nostril one time per day.  Do this in the evening.    Robitussin DM twice a day as needed.    Prednisone taper as directed.

## 2018-05-26 NOTE — Assessment & Plan Note (Addendum)
Physical today 05/26/18.  PAP 01/2017 Colonoscopy 11/2017 - rectal polyp.

## 2018-05-31 ENCOUNTER — Encounter: Payer: Self-pay | Admitting: Internal Medicine

## 2018-05-31 DIAGNOSIS — J069 Acute upper respiratory infection, unspecified: Secondary | ICD-10-CM | POA: Insufficient documentation

## 2018-05-31 NOTE — Assessment & Plan Note (Signed)
Increased cough and congestion.  Persistent cough.  Treat with nasal spray and mucinex/robitussin as directed.  Prednisone taper as directed.  Follow.

## 2018-05-31 NOTE — Assessment & Plan Note (Signed)
Low carb diet and exercise.  Follow met b and a1c.   

## 2018-05-31 NOTE — Assessment & Plan Note (Signed)
Followed by dermatology

## 2018-05-31 NOTE — Assessment & Plan Note (Signed)
On crestor.  Low cholesterol diet and exercise.  Follow lipid panel and liver function tests.   

## 2018-05-31 NOTE — Assessment & Plan Note (Signed)
Blood pressure under good control.  Continue same medication regimen.  Follow pressures.  Follow metabolic panel.   

## 2018-05-31 NOTE — Assessment & Plan Note (Signed)
Symptoms controlled on protonix.  Follow.   

## 2018-05-31 NOTE — Assessment & Plan Note (Signed)
Followed by oncology.  On arimidex.  

## 2018-06-04 IMAGING — US US BREAST*R* LIMITED INC AXILLA
1 series · 6 of 6 positions shown · non-contrast
Comparison: Previous exams including recent screening mammogram
dated 02/12/2017.

CLINICAL DATA: Patient returns today to evaluate a possible right
breast distortion identified on recent screening mammogram.

EXAM:
2D DIGITAL DIAGNOSTIC RIGHT MAMMOGRAM WITH CAD AND ADJUNCT TOMO
ULTRASOUND RIGHT BREAST

[Series 1: us breast*right* limited inc axilla · 0.07mm/px · 6 of 6 slices shown]
[im 1/6]
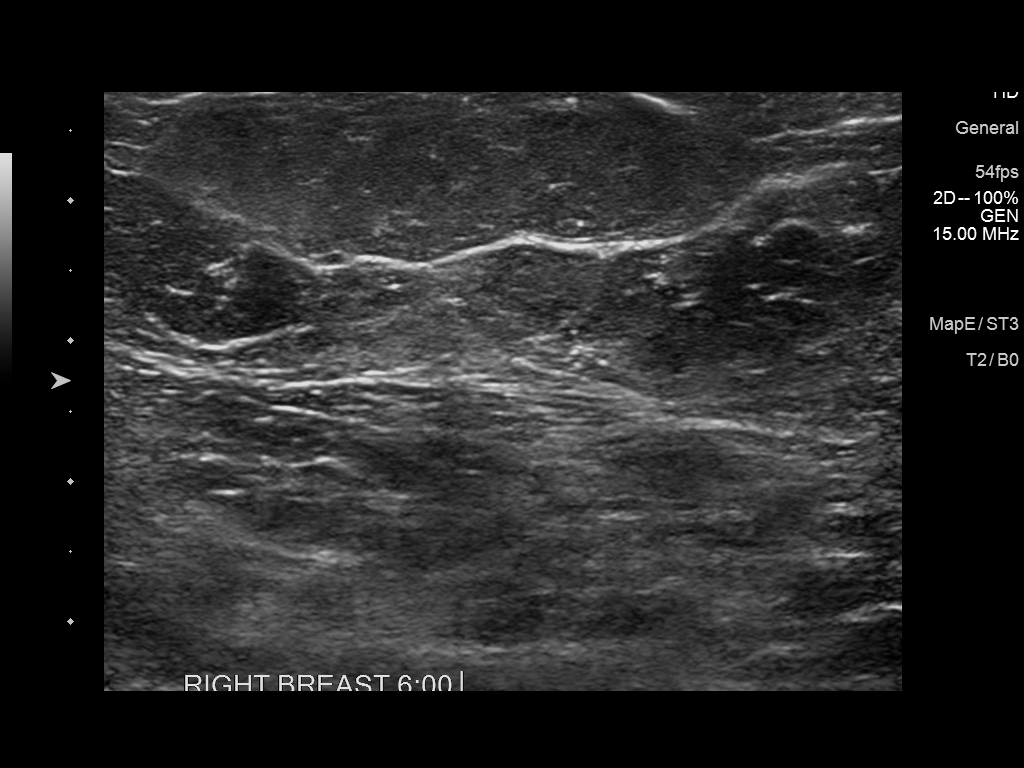
[im 2/6]
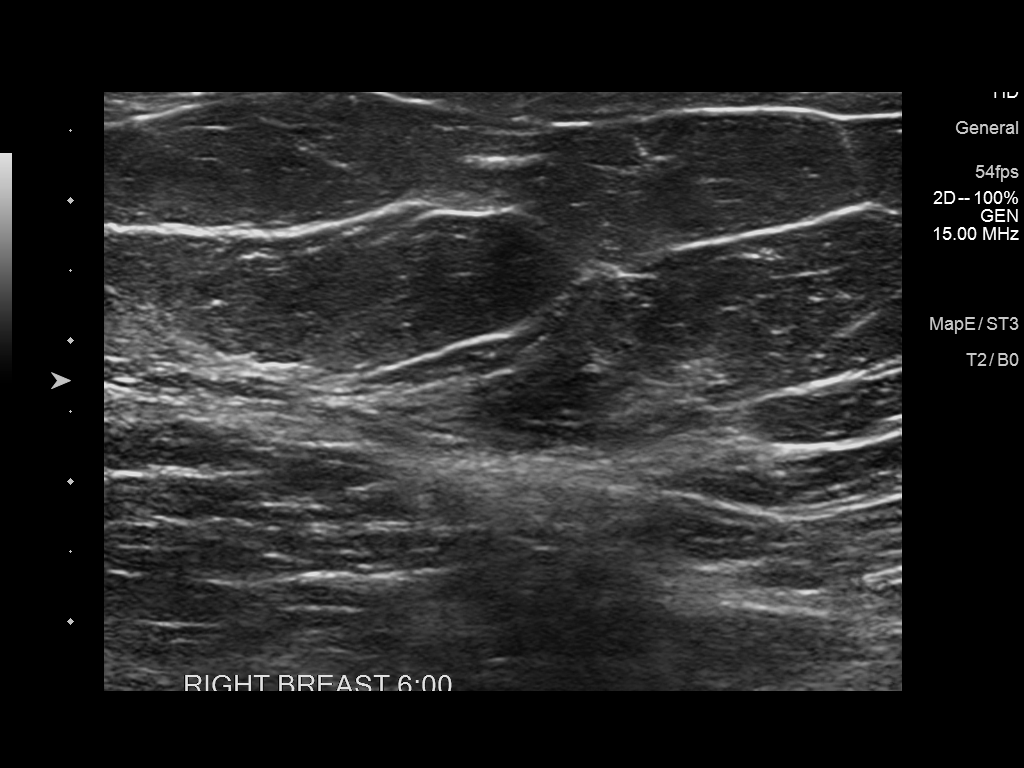
[im 3/6]
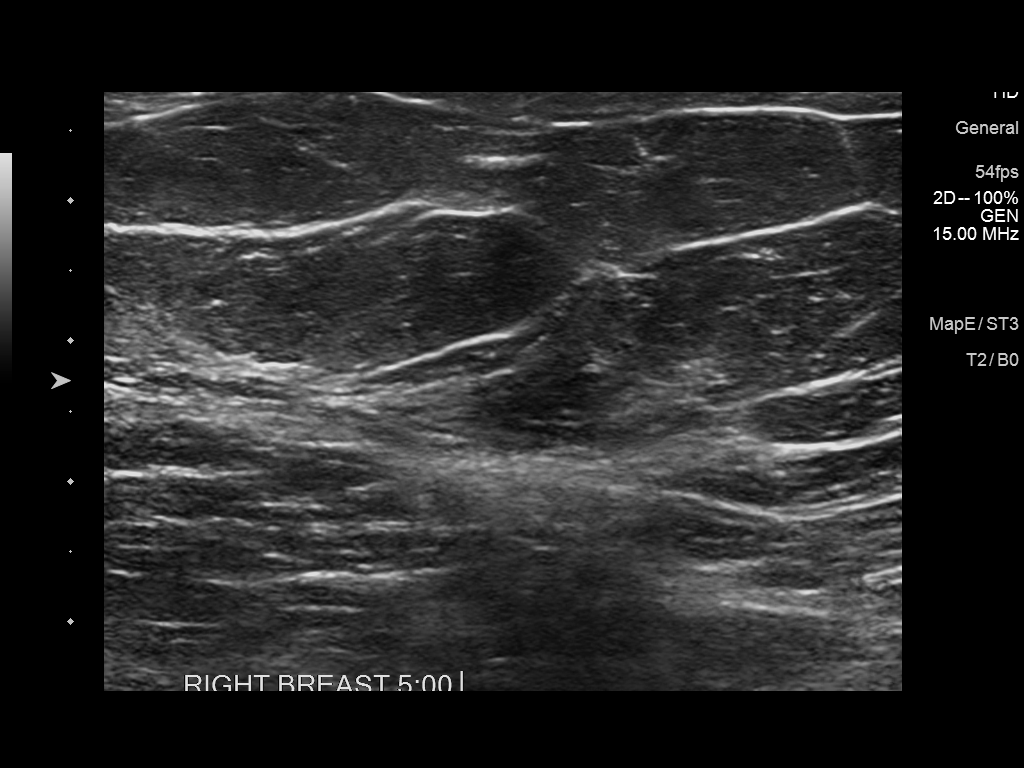
[im 4/6]
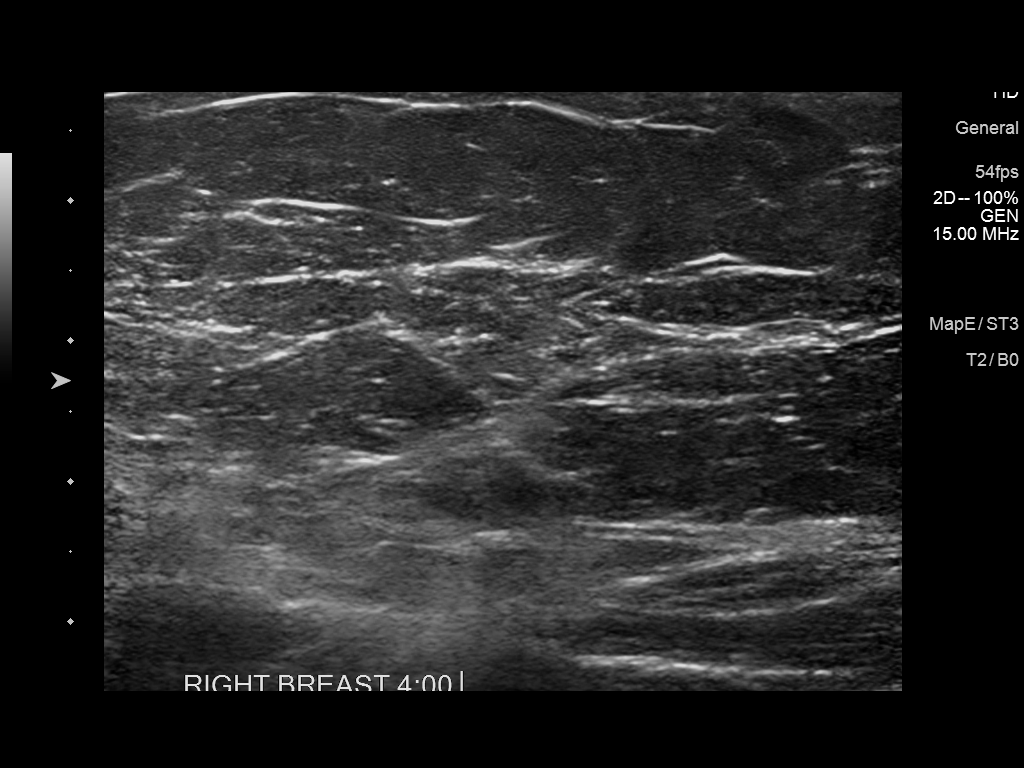
[im 5/6]
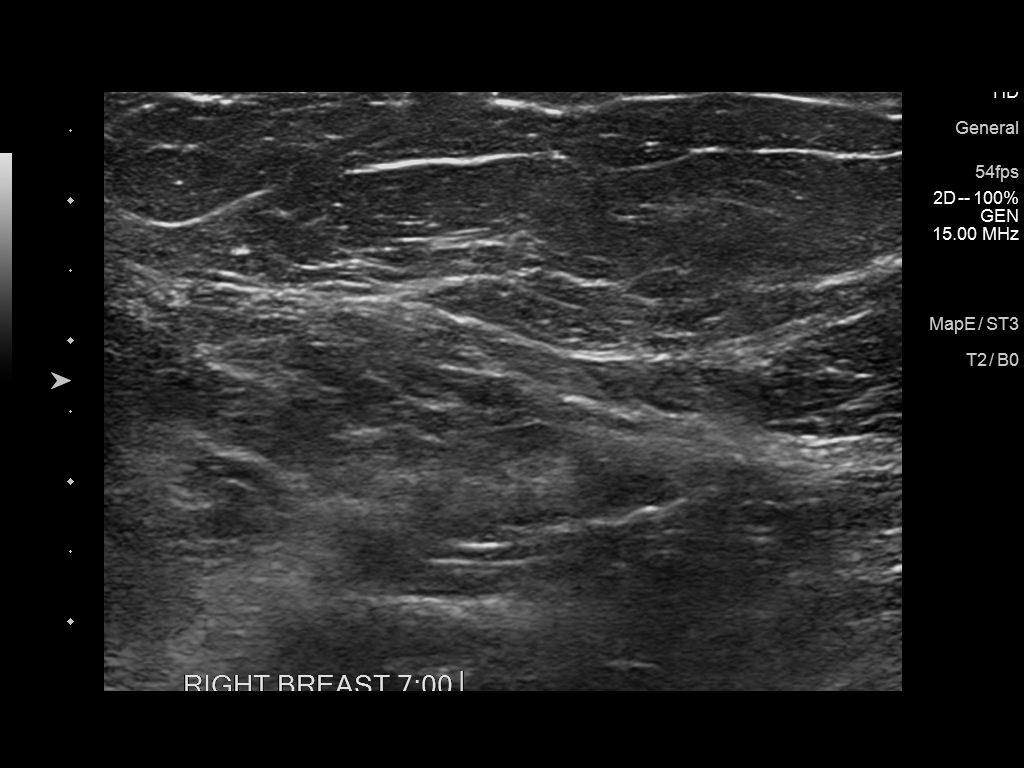
[im 6/6]
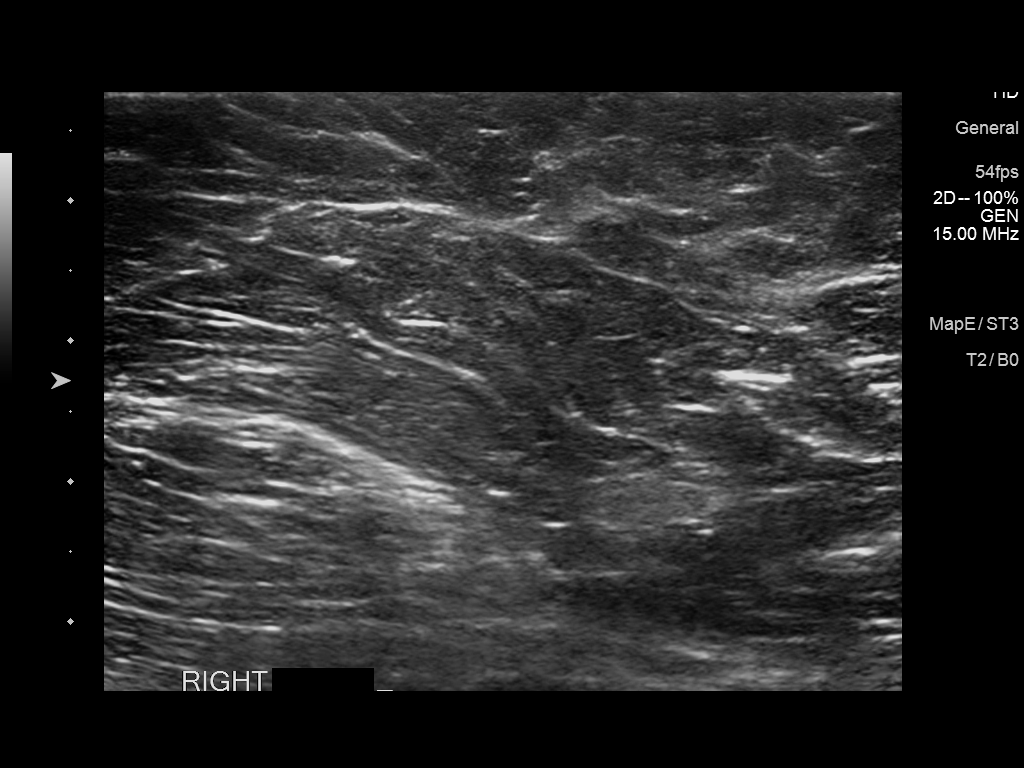

[6 of 6 positions shown; findings below may reference images not displayed]

ACR Breast Density Category b: There are scattered areas of
fibroglandular density.
FINDINGS: On today's additional views with spot compression and 3D
tomosynthesis, there is a persistent distortion within the inferior
right breast, slightly inner, 5-6 o'clock axis region, at middle to
posterior depth, tomosynthesis spot CC slice 18, spot MLO slice 24.

Mammographic images were processed with CAD.

Targeted ultrasound is performed, evaluating the entire inferior
right breast with particular attention to the 5-6 o'clock axis
region corresponding to the area of mammographic finding, showing no
correlate for the right breast distortion. No suspicious solid or
cystic mass.
IMPRESSION: Architectural distortion within the inferior right breast, 5-6
o'clock axis region, without sonographic correlate. This is a
suspicious finding for which stereotactic biopsy is recommended.

RECOMMENDATION:
Stereotactic biopsy, with 3D tomosynthesis guidance, for the right
breast distortion.

Ordering physician will be contacted with today's results and
patient will then be scheduled for stereotactic biopsy with 3D
tomosynthesis guidance at her earliest convenience.

I have discussed the findings and recommendations with the patient.
Results were also provided in writing at the conclusion of the
visit. If applicable, a reminder letter will be sent to the patient
regarding the next appointment.

BI-RADS CATEGORY  4: Suspicious.

## 2018-06-11 NOTE — Telephone Encounter (Signed)
error 

## 2018-06-12 ENCOUNTER — Other Ambulatory Visit: Payer: Self-pay

## 2018-06-12 ENCOUNTER — Encounter: Payer: Self-pay | Admitting: Internal Medicine

## 2018-06-12 MED ORDER — CLONAZEPAM 0.5 MG PO TABS: 0.5000 mg | ORAL_TABLET | Freq: Every day | ORAL | 1 refills | Status: DC

## 2018-06-12 NOTE — Progress Notes (Signed)
Last OV 05/26/2018 Next OV 10/02/18 Last refill 04/10/2018

## 2018-07-13 ENCOUNTER — Encounter: Payer: Self-pay | Admitting: Internal Medicine

## 2018-07-14 MED ORDER — CLONAZEPAM 0.5 MG PO TABS: 0.5000 mg | ORAL_TABLET | Freq: Every day | ORAL | 1 refills | Status: DC

## 2018-07-14 NOTE — Telephone Encounter (Signed)
rx sent in for clonazepam #30 with one refill.  

## 2018-08-03 ENCOUNTER — Other Ambulatory Visit: Payer: Self-pay | Admitting: Internal Medicine

## 2018-08-07 ENCOUNTER — Other Ambulatory Visit: Payer: Self-pay | Admitting: Internal Medicine

## 2018-08-10 ENCOUNTER — Other Ambulatory Visit: Payer: Self-pay

## 2018-08-10 MED ORDER — POTASSIUM CHLORIDE ER 10 MEQ PO CPCR: 20.0000 meq | ORAL_CAPSULE | Freq: Every day | ORAL | 1 refills | Status: DC

## 2018-08-17 ENCOUNTER — Other Ambulatory Visit: Payer: Self-pay

## 2018-08-17 ENCOUNTER — Other Ambulatory Visit: Payer: Self-pay | Admitting: Internal Medicine

## 2018-08-17 MED ORDER — PANTOPRAZOLE SODIUM 40 MG PO TBEC: DELAYED_RELEASE_TABLET | ORAL | 0 refills | Status: DC

## 2018-09-08 ENCOUNTER — Encounter: Payer: Self-pay | Admitting: Internal Medicine

## 2018-09-08 MED ORDER — CLONAZEPAM 0.5 MG PO TABS: 0.5000 mg | ORAL_TABLET | Freq: Every day | ORAL | 1 refills | Status: DC

## 2018-09-08 NOTE — Telephone Encounter (Signed)
rx sent for clonzaepam #30 with 1 refill.

## 2018-09-08 NOTE — Telephone Encounter (Signed)
Patient needs her clonazepam refilled   Last OV 05/26/2018 Next OV 01/18/2019 Last refill 07/14/2018

## 2018-09-30 ENCOUNTER — Other Ambulatory Visit: Payer: PPO

## 2018-10-02 ENCOUNTER — Ambulatory Visit: Payer: PPO | Admitting: Internal Medicine

## 2018-11-10 ENCOUNTER — Encounter: Payer: Self-pay | Admitting: Internal Medicine

## 2018-11-10 ENCOUNTER — Other Ambulatory Visit: Payer: Self-pay

## 2018-11-10 MED ORDER — CLONAZEPAM 0.5 MG PO TABS: 0.5000 mg | ORAL_TABLET | Freq: Every day | ORAL | 1 refills | Status: DC

## 2018-11-10 NOTE — Progress Notes (Signed)
rx ok'd for clonazepam #30 with one refill.   

## 2018-11-10 NOTE — Progress Notes (Signed)
Last OV 05/26/2018 Next OV 01/18/2019 Last refill 09/08/18

## 2018-11-17 ENCOUNTER — Encounter: Payer: Self-pay | Admitting: Internal Medicine

## 2018-11-18 ENCOUNTER — Other Ambulatory Visit: Payer: Self-pay

## 2018-11-18 ENCOUNTER — Encounter: Payer: Self-pay | Admitting: Internal Medicine

## 2018-11-18 ENCOUNTER — Ambulatory Visit (INDEPENDENT_AMBULATORY_CARE_PROVIDER_SITE_OTHER): Payer: PPO | Admitting: Internal Medicine

## 2018-11-18 DIAGNOSIS — R21 Rash and other nonspecific skin eruption: Secondary | ICD-10-CM | POA: Diagnosis not present

## 2018-11-18 DIAGNOSIS — K219 Gastro-esophageal reflux disease without esophagitis: Secondary | ICD-10-CM

## 2018-11-18 MED ORDER — ROPINIROLE HCL 1 MG PO TABS: 1.0000 mg | ORAL_TABLET | Freq: Every day | ORAL | 0 refills | Status: DC

## 2018-11-18 MED ORDER — CEPHALEXIN 500 MG PO CAPS: 500.0000 mg | ORAL_CAPSULE | Freq: Three times a day (TID) | ORAL | 0 refills | Status: DC

## 2018-11-18 MED ORDER — PANTOPRAZOLE SODIUM 20 MG PO TBEC: 20.0000 mg | DELAYED_RELEASE_TABLET | Freq: Every day | ORAL | 1 refills | Status: DC

## 2018-11-18 NOTE — Telephone Encounter (Signed)
LMTCB

## 2018-11-18 NOTE — Telephone Encounter (Signed)
Pt scheduled for virtual.  ?

## 2018-11-18 NOTE — Progress Notes (Signed)
Patient ID: Melissa Rhodes, female   DOB: Mar 24, 1951, 68 y.o.   MRN: [id number]   Virtual Visit via video Note  This visit type was conducted due to national recommendations for restrictions regarding the COVID-19 pandemic (e.g. social distancing).  This format is felt to be most appropriate for this patient at this time.  All issues noted in this document were discussed and addressed.  No physical exam was performed (except for noted visual exam findings with Video Visits).   I connected with Melissa Rhodes by a video enabled telemedicine application and verified that I am speaking with the correct person using two identifiers. Location patient: home Location provider: work Persons participating in the virtual visit: patient, provider  I discussed the limitations, risks, security and privacy concerns of performing an evaluation and management service by video and the availability of in person appointments.  The patient expressed understanding and agreed to proceed.   Reason for visit: acute visit.   HPI: She noticed three days ago, a rash on her left lower leg.  Has noticed some burning and stinging.  Localized to lower leg.  No bite.  No known injury or trauma.  Itches.  No swelling.  Had similar rash last year. Resolved with abx.  No fever.  No other rash.  States otherwise doing well.  Eating well.  No nausea or vomiting.  Bowels moving.  Wants to decrease protonix dose.  Is s/p EGD 11/2017.  Found to have reflux esophagitis, esophageal polyp and small hiatal hernia.     ROS: See pertinent positives and negatives per HPI.  Past Medical History:  Diagnosis Date  . Arthritis 2009  . 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 Cancer [facility]) 2005   colon  . GERD (gastroesophageal reflux disease)   . Hypertension 2010  . Mammographic microcalcification 2013  . Melanoma ([place]) 2019   left arm  . Mitral valve prolapse 2006  . 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 Obesity, unspecified 2014  . Personal history of colonic polyps 2013  . Renal disorder    kidney stones  . Special screening for malignant neoplasms, colon 2014  . Tubal pregnancy     Past Surgical History:  Procedure Laterality Date  . BREAST BIOPSY Left 2013  . BREAST BIOPSY Right 03/06/2017   LIQ coil clip. FEATURES OF RADIAL SCAR  . BREAST SURGERY Left 2013   re excision of left breast showed small area of residual ADH, but she was not upsatged to DCIS or invasive cancer. The margins were clear.  Patient declined tamoxifen therapy  . COLONOSCOPY  [id number]  . COLONOSCOPY W/ POLYPECTOMY  2014  . COLONOSCOPY WITH PROPOFOL N/A 11/19/2017   Procedure: COLONOSCOPY WITH PROPOFOL;  Surgeon: Robert Bellow, MD;  Location: [facility] ENDOSCOPY;  Service: Endoscopy;  Laterality: N/A;  . ECTOPIC PREGNANCY SURGERY  1981  . ESOPHAGOGASTRODUODENOSCOPY (EGD) WITH PROPOFOL N/A 11/19/2017   Procedure: ESOPHAGOGASTRODUODENOSCOPY (EGD) WITH PROPOFOL;  Surgeon: Robert Bellow, MD;  Location: [facility] ENDOSCOPY;  Service: Endoscopy;  Laterality: N/A;  . kidney stones  2009  . SHOULDER SURGERY Right 2007  . TUBAL LIGATION  1981    Family History  Problem Relation Age of Onset  . Cancer Father 11       lung  . Early death Father   . Arthritis Mother   . Depression Mother   . Diabetes Mother   . Heart disease Mother   . Hypertension  Mother   . Hyperlipidemia Mother   . Stroke Mother   . Depression Sister   . Diabetes Sister   . Hyperlipidemia Sister   . Hypertension Sister   . Depression Sister   . Hyperlipidemia Sister   . Hypertension Sister   . Miscarriages / Stillbirths Sister   . Depression Sister   . Hyperlipidemia Sister   . Hypertension Sister   . Depression Brother   . Diabetes Brother   . Hyperlipidemia Brother   . Hypertension Brother   . Stroke Brother   . Breast cancer Neg Hx   . Ovarian cancer Neg Hx     SOCIAL HX: reviewed.    Current Outpatient  Medications:  .  anastrozole (ARIMIDEX) 1 MG tablet, Take 1 mg by mouth daily. , Disp: , Rfl:  .  aspirin 81 MG tablet, Take 81 mg by mouth daily. , Disp: , Rfl:  .  cephALEXin (KEFLEX) 500 MG capsule, Take 1 capsule (500 mg total) by mouth 3 (three) times daily., Disp: 21 capsule, Rfl: 0 .  clonazePAM (KLONOPIN) 0.5 MG tablet, Take 1 tablet (0.5 mg total) by mouth daily., Disp: 30 tablet, Rfl: 1 .  fluocinonide cream (LIDEX) 0.05 %, Use as directed, Disp: 30 g, Rfl: 0 .  hydrochlorothiazide (HYDRODIURIL) 25 MG tablet, Take 1 tablet (25 mg total) by mouth daily., Disp: 90 tablet, Rfl: 1 .  lisinopril (PRINIVIL,ZESTRIL) 10 MG tablet, TAKE 1 TABLET(10 MG) BY MOUTH DAILY, Disp: 90 tablet, Rfl: 1 .  pantoprazole (PROTONIX) 20 MG tablet, Take 1 tablet (20 mg total) by mouth daily., Disp: 30 tablet, Rfl: 1 .  potassium chloride (MICRO-K) 10 MEQ CR capsule, Take 2 capsules (20 mEq total) by mouth daily., Disp: 180 capsule, Rfl: 1 .  predniSONE (DELTASONE) 10 MG tablet, Take 4 tablets x 1 day and then decrease by 1/2 tablet per day until down to zero mg., Disp: 18 tablet, Rfl: 0 .  rOPINIRole (REQUIP) 1 MG tablet, Take 1 tablet (1 mg total) by mouth at bedtime., Disp: 90 tablet, Rfl: 0 .  rosuvastatin (CRESTOR) 20 MG tablet, TAKE 1 TABLET(20 MG) BY MOUTH DAILY, Disp: 90 tablet, Rfl: 1  EXAM:  GENERAL: alert, oriented, appears well and in no acute distress  HEENT: atraumatic, conjunttiva clear, no obvious abnormalities on inspection of external nose and ears  NECK: normal movements of the head and neck  LUNGS: on inspection no signs of respiratory distress, breathing rate appears normal, no obvious gross SOB, gasping or wheezing  CV: no obvious cyanosis  MS: no lower extremity swelling.  No calf swelling.    SKIN:  Erythematous area - left lower extremity.  No redness extending up leg.    PSYCH/NEURO: pleasant and cooperative, no obvious depression or anxiety, speech and thought processing  grossly intact  ASSESSMENT AND PLAN:  Discussed the following assessment and plan:   Rash Left lower extremity redness as outlined.  Itching with burning and stinging.  No increased swelling.  Triamcinolone cream as directed.  Concern over possible cellulitis.  Treat with keflex.  Probiotics as directed.  Follow.    GERD (gastroesophageal reflux disease) EGD as outlined.  On protonix 40mg  q day.  Decrease to 20mg  q day.  Follow.      I discussed the assessment and treatment plan with the patient. The patient was provided an opportunity to ask questions and all were answered. The patient agreed with the plan and demonstrated an understanding of the instructions.   The patient  was advised to call back or seek an in-person evaluation if the symptoms worsen or if the condition fails to improve as anticipated.    Einar Pheasant, MD

## 2018-11-22 ENCOUNTER — Encounter: Payer: Self-pay | Admitting: Internal Medicine

## 2018-11-22 DIAGNOSIS — R21 Rash and other nonspecific skin eruption: Secondary | ICD-10-CM | POA: Insufficient documentation

## 2018-11-22 NOTE — Assessment & Plan Note (Signed)
EGD as outlined.  On protonix 40mg  q day.  Decrease to 20mg  q day.  Follow.

## 2018-11-22 NOTE — Assessment & Plan Note (Signed)
Left lower extremity redness as outlined.  Itching with burning and stinging.  No increased swelling.  Triamcinolone cream as directed.  Concern over possible cellulitis.  Treat with keflex.  Probiotics as directed.  Follow.

## 2019-01-08 DIAGNOSIS — N6099 Unspecified benign mammary dysplasia of unspecified breast: Secondary | ICD-10-CM | POA: Diagnosis not present

## 2019-01-08 DIAGNOSIS — Z1231 Encounter for screening mammogram for malignant neoplasm of breast: Secondary | ICD-10-CM | POA: Diagnosis not present

## 2019-01-11 ENCOUNTER — Ambulatory Visit
Admission: RE | Admit: 2019-01-11 | Discharge: 2019-01-11 | Disposition: A | Discharge status: Home / Self Care | Payer: PPO | Source: Ambulatory Visit | Attending: Internal Medicine | Admitting: Internal Medicine

## 2019-01-11 ENCOUNTER — Ambulatory Visit (INDEPENDENT_AMBULATORY_CARE_PROVIDER_SITE_OTHER): Payer: PPO | Admitting: Internal Medicine

## 2019-01-11 ENCOUNTER — Telehealth: Payer: Self-pay

## 2019-01-11 ENCOUNTER — Other Ambulatory Visit: Payer: Self-pay

## 2019-01-11 ENCOUNTER — Encounter

## 2019-01-11 ENCOUNTER — Other Ambulatory Visit (INDEPENDENT_AMBULATORY_CARE_PROVIDER_SITE_OTHER): Payer: PPO

## 2019-01-11 ENCOUNTER — Encounter: Payer: Self-pay | Admitting: Internal Medicine

## 2019-01-11 DIAGNOSIS — E7800 Pure hypercholesterolemia, unspecified: Secondary | ICD-10-CM | POA: Diagnosis not present

## 2019-01-11 DIAGNOSIS — M79645 Pain in left finger(s): Secondary | ICD-10-CM | POA: Insufficient documentation

## 2019-01-11 DIAGNOSIS — M25512 Pain in left shoulder: Secondary | ICD-10-CM | POA: Diagnosis not present

## 2019-01-11 DIAGNOSIS — G44309 Post-traumatic headache, unspecified, not intractable: Secondary | ICD-10-CM | POA: Diagnosis not present

## 2019-01-11 DIAGNOSIS — S79912A Unspecified injury of left hip, initial encounter: Secondary | ICD-10-CM | POA: Diagnosis not present

## 2019-01-11 DIAGNOSIS — R51 Headache: Secondary | ICD-10-CM | POA: Diagnosis not present

## 2019-01-11 DIAGNOSIS — R739 Hyperglycemia, unspecified: Secondary | ICD-10-CM | POA: Diagnosis not present

## 2019-01-11 DIAGNOSIS — M25552 Pain in left hip: Secondary | ICD-10-CM | POA: Insufficient documentation

## 2019-01-11 DIAGNOSIS — M19042 Primary osteoarthritis, left hand: Secondary | ICD-10-CM | POA: Diagnosis not present

## 2019-01-11 DIAGNOSIS — S4992XA Unspecified injury of left shoulder and upper arm, initial encounter: Secondary | ICD-10-CM | POA: Diagnosis not present

## 2019-01-11 DIAGNOSIS — M549 Dorsalgia, unspecified: Secondary | ICD-10-CM | POA: Diagnosis not present

## 2019-01-11 DIAGNOSIS — I10 Essential (primary) hypertension: Secondary | ICD-10-CM

## 2019-01-11 DIAGNOSIS — R519 Headache, unspecified: Secondary | ICD-10-CM | POA: Insufficient documentation

## 2019-01-11 LAB — BASIC METABOLIC PANEL
BUN: 20 mg/dL (ref 6–23)
CO2: 27 mEq/L (ref 19–32)
Calcium: 9.1 mg/dL (ref 8.4–10.5)
Chloride: 108 mEq/L (ref 96–112)
Creatinine, Ser: 0.61 mg/dL (ref 0.40–1.20)
GFR: 97.38 mL/min (ref >60.00)
Glucose, Bld: 102 mg/dL — ABNORMAL HIGH (ref 70–99)
Potassium: 3.8 mEq/L (ref 3.5–5.1)
Sodium: 141 mEq/L (ref 135–145)

## 2019-01-11 LAB — LIPID PANEL
Cholesterol: 129 mg/dL (ref 0–200)
HDL: 52.8 mg/dL (ref >39.00)
LDL Cholesterol: 63 mg/dL (ref 0–99)
NonHDL: 76.29
Total CHOL/HDL Ratio: 2
Triglycerides: 64 mg/dL (ref 0.0–149.0)
VLDL: 12.8 mg/dL (ref 0.0–40.0)

## 2019-01-11 LAB — HEMOGLOBIN A1C: Hgb A1c MFr Bld: 5.8 % (ref 4.6–6.5)

## 2019-01-11 LAB — HEPATIC FUNCTION PANEL
ALT: 33 U/L (ref 0–35)
AST: 23 U/L (ref 0–37)
Albumin: 4 g/dL (ref 3.5–5.2)
Alkaline Phosphatase: 64 U/L (ref 39–117)
Bilirubin, Direct: 0.1 mg/dL (ref 0.0–0.3)
Total Bilirubin: 0.5 mg/dL (ref 0.2–1.2)
Total Protein: 5.7 g/dL — ABNORMAL LOW (ref 6.0–8.3)

## 2019-01-11 MED ORDER — CLONAZEPAM 0.5 MG PO TABS: 0.5000 mg | ORAL_TABLET | Freq: Every day | ORAL | 1 refills | Status: DC
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
## 2019-01-11 NOTE — Telephone Encounter (Signed)
[facility] call report for pt's CT scan:  No evidence of acute intracranial abnormalities.  Pt released to go.  Results are in [place].

## 2019-01-12 ENCOUNTER — Encounter: Payer: Self-pay | Admitting: Internal Medicine

## 2019-01-12 ENCOUNTER — Other Ambulatory Visit: Payer: Self-pay

## 2019-01-12 NOTE — Telephone Encounter (Signed)
See result note for notification.
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
## 2019-01-16 NOTE — Assessment & Plan Note (Signed)
Blood pressure elevated today.  In pain.  Recheck improved, but still elevated.  Have her spot check her pressure. Send in readings.

## 2019-01-18 ENCOUNTER — Ambulatory Visit: Payer: PPO | Admitting: Internal Medicine

## 2019-02-01 ENCOUNTER — Other Ambulatory Visit: Payer: Self-pay | Admitting: Internal Medicine

## 2019-02-03 DIAGNOSIS — D2272 Melanocytic nevi of left lower limb, including hip: Secondary | ICD-10-CM | POA: Diagnosis not present

## 2019-02-03 DIAGNOSIS — D485 Neoplasm of uncertain behavior of skin: Secondary | ICD-10-CM | POA: Diagnosis not present

## 2019-02-03 DIAGNOSIS — Z85820 Personal history of malignant melanoma of skin: Secondary | ICD-10-CM | POA: Diagnosis not present

## 2019-02-03 DIAGNOSIS — L821 Other seborrheic keratosis: Secondary | ICD-10-CM | POA: Diagnosis not present

## 2019-02-03 DIAGNOSIS — D2262 Melanocytic nevi of left upper limb, including shoulder: Secondary | ICD-10-CM | POA: Diagnosis not present

## 2019-02-03 DIAGNOSIS — L400 Psoriasis vulgaris: Secondary | ICD-10-CM | POA: Diagnosis not present

## 2019-02-03 DIAGNOSIS — Z08 Encounter for follow-up examination after completed treatment for malignant neoplasm: Secondary | ICD-10-CM | POA: Diagnosis not present

## 2019-02-03 DIAGNOSIS — D2261 Melanocytic nevi of right upper limb, including shoulder: Secondary | ICD-10-CM | POA: Diagnosis not present

## 2019-02-09 ENCOUNTER — Other Ambulatory Visit: Payer: Self-pay

## 2019-02-09 ENCOUNTER — Encounter: Payer: Self-pay | Admitting: Internal Medicine

## 2019-02-09 MED ORDER — PANTOPRAZOLE SODIUM 20 MG PO TBEC: 20.0000 mg | DELAYED_RELEASE_TABLET | Freq: Every day | ORAL | 1 refills | Status: DC

## 2019-02-16 ENCOUNTER — Other Ambulatory Visit: Payer: Self-pay

## 2019-02-16 ENCOUNTER — Encounter: Payer: Self-pay | Admitting: Internal Medicine

## 2019-02-16 MED ORDER — PANTOPRAZOLE SODIUM 20 MG PO TBEC: 20.0000 mg | DELAYED_RELEASE_TABLET | Freq: Every day | ORAL | 1 refills | Status: DC

## 2019-02-16 MED ORDER — ROPINIROLE HCL 1 MG PO TABS: 1.0000 mg | ORAL_TABLET | Freq: Every day | ORAL | 1 refills | Status: DC

## 2019-02-23 ENCOUNTER — Other Ambulatory Visit: Payer: Self-pay

## 2019-02-23 ENCOUNTER — Encounter: Payer: Self-pay | Admitting: Internal Medicine

## 2019-02-23 ENCOUNTER — Ambulatory Visit (INDEPENDENT_AMBULATORY_CARE_PROVIDER_SITE_OTHER): Payer: PPO | Admitting: Internal Medicine

## 2019-02-23 DIAGNOSIS — K219 Gastro-esophageal reflux disease without esophagitis: Secondary | ICD-10-CM | POA: Diagnosis not present

## 2019-02-23 DIAGNOSIS — C439 Malignant melanoma of skin, unspecified: Secondary | ICD-10-CM | POA: Diagnosis not present

## 2019-02-23 DIAGNOSIS — N6099 Unspecified benign mammary dysplasia of unspecified breast: Secondary | ICD-10-CM | POA: Diagnosis not present

## 2019-02-23 DIAGNOSIS — E7800 Pure hypercholesterolemia, unspecified: Secondary | ICD-10-CM | POA: Diagnosis not present

## 2019-02-23 DIAGNOSIS — R739 Hyperglycemia, unspecified: Secondary | ICD-10-CM

## 2019-02-23 DIAGNOSIS — I10 Essential (primary) hypertension: Secondary | ICD-10-CM

## 2019-02-23 MED ORDER — PANTOPRAZOLE SODIUM 40 MG PO TBEC: 40.0000 mg | DELAYED_RELEASE_TABLET | Freq: Every day | ORAL | 1 refills | Status: DC

## 2019-02-23 NOTE — Progress Notes (Signed)
Patient ID: Melissa Rhodes, female   DOB: [birth year], 68 y.o.   MRN: [id number]   Virtual Visit via video Note  This visit type was conducted due to national recommendations for restrictions regarding the COVID-19 pandemic (e.g. social distancing).  This format is felt to be most appropriate for this patient at this time.  All issues noted in this document were discussed and addressed.  No physical exam was performed (except for noted visual exam findings with Video Visits).   I connected with Melissa Rhodes by a video enabled telemedicine application and verified that I am speaking with the correct person using two identifiers. Location patient: home Location provider: work  Persons participating in the virtual visit: patient, provider  I discussed the limitations, risks, security and privacy concerns of performing an evaluation and management service by video and the availability of in person appointments.  The patient expressed understanding and agreed to proceed.   Reason for visit: scheduled follow up.    HPI: She reports she is doing relatively well.  States blood pressure doing well.  She does report having some issues with acid reflux.  Mostly at night.  Feels needs to go back up on the dose of her protonix.  On 51m q day now.  No chest pain.  No sob.  No abdominal pain.  Bowels moving.  Saw oncology 12/2018.  On arimidex.  Stable.  Recommended f/u in 6 months.  Taking tylenol for her left thumb.  Will notify me if feels needs any further intervention.     ROS: See pertinent positives and negatives per HPI.  Past Medical History:  Diagnosis Date  . Arthritis 2009  . 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 Cancer ([facility] 2005   colon  . GERD (gastroesophageal reflux disease)   . Hypertension 2010  . Mammographic microcalcification 2013  . Melanoma ([place] 2019   left arm  . Mitral valve prolapse 2006  . 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  Obesity, unspecified 2014  . Personal history of colonic polyps 2013  . Renal disorder    kidney stones  . Special screening for malignant neoplasms, colon 2014  . Tubal pregnancy     Past Surgical History:  Procedure Laterality Date  . BREAST BIOPSY Left 2013  . BREAST BIOPSY Right 03/06/2017   LIQ coil clip. FEATURES OF RADIAL SCAR  . BREAST SURGERY Left 2013   re excision of left breast showed small area of residual ADH, but she was not upsatged to DCIS or invasive cancer. The margins were clear.  Patient declined tamoxifen therapy  . COLONOSCOPY  [id number] . COLONOSCOPY W/ POLYPECTOMY  2014  . COLONOSCOPY WITH PROPOFOL N/A 11/19/2017   Procedure: COLONOSCOPY WITH PROPOFOL;  Surgeon: BRobert Bellow MD;  Location: [facility] ENDOSCOPY;  Service: Endoscopy;  Laterality: N/A;  . ECTOPIC PREGNANCY SURGERY  1981  . ESOPHAGOGASTRODUODENOSCOPY (EGD) WITH PROPOFOL N/A 11/19/2017   Procedure: ESOPHAGOGASTRODUODENOSCOPY (EGD) WITH PROPOFOL;  Surgeon: BRobert Bellow MD;  Location: [facility] ENDOSCOPY;  Service: Endoscopy;  Laterality: N/A;  . kidney stones  2009  . SHOULDER SURGERY Right 2007  . TUBAL LIGATION  1981    Family History  Problem Relation Age of Onset  . Cancer Father 619      lung  . Early death Father   . Arthritis Mother   . Depression Mother   . Diabetes Mother   . Heart disease Mother   .  Hypertension Mother   . Hyperlipidemia Mother   . Stroke Mother   . Depression Sister   . Diabetes Sister   . Hyperlipidemia Sister   . Hypertension Sister   . Depression Sister   . Hyperlipidemia Sister   . Hypertension Sister   . Miscarriages / Stillbirths Sister   . Depression Sister   . Hyperlipidemia Sister   . Hypertension Sister   . Depression Brother   . Diabetes Brother   . Hyperlipidemia Brother   . Hypertension Brother   . Stroke Brother   . Breast cancer Neg Hx   . Ovarian cancer Neg Hx     SOCIAL HX: reviewed.    Current Outpatient Medications:  .   anastrozole (ARIMIDEX) 1 MG tablet, Take 1 mg by mouth daily. , Disp: , Rfl:  .  aspirin 81 MG tablet, Take 81 mg by mouth daily. , Disp: , Rfl:  .  clonazePAM (KLONOPIN) 0.5 MG tablet, Take 1 tablet (0.5 mg total) by mouth daily., Disp: 30 tablet, Rfl: 1 .  fluocinonide cream (LIDEX) 0.05 %, Use as directed, Disp: 30 g, Rfl: 0 .  hydrochlorothiazide (HYDRODIURIL) 25 MG tablet, Take 1 tablet (25 mg total) by mouth daily., Disp: 90 tablet, Rfl: 1 .  lisinopril (PRINIVIL,ZESTRIL) 10 MG tablet, TAKE 1 TABLET(10 MG) BY MOUTH DAILY, Disp: 90 tablet, Rfl: 1 .  potassium chloride (K-DUR) 10 MEQ tablet, TAKE 1 TABLET(10 MEQ) BY MOUTH TWICE DAILY, Disp: 180 tablet, Rfl: 1 .  potassium chloride (MICRO-K) 10 MEQ CR capsule, Take 2 capsules (20 mEq total) by mouth daily., Disp: 180 capsule, Rfl: 1 .  rOPINIRole (REQUIP) 1 MG tablet, Take 1 tablet (1 mg total) by mouth at bedtime., Disp: 90 tablet, Rfl: 1 .  rosuvastatin (CRESTOR) 20 MG tablet, TAKE 1 TABLET(20 MG) BY MOUTH DAILY, Disp: 90 tablet, Rfl: 1 .  pantoprazole (PROTONIX) 40 MG tablet, Take 1 tablet (40 mg total) by mouth daily., Disp: 90 tablet, Rfl: 1  EXAM:  VITALS per patient if applicable: 220U/54-27  GENERAL: alert, oriented, appears well and in no acute distress  HEENT: atraumatic, conjunttiva clear, no obvious abnormalities on inspection of external nose and ears  NECK: normal movements of the head and neck  LUNGS: on inspection no signs of respiratory distress, breathing rate appears normal, no obvious gross SOB, gasping or wheezing  CV: no obvious cyanosis  PSYCH/NEURO: pleasant and cooperative, no obvious depression or anxiety, speech and thought processing grossly intact  ASSESSMENT AND PLAN:  Discussed the following assessment and plan:  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  On armidex.    GERD (gastroesophageal reflux disease) Acid reflux as outlined.  Increase protonix back up to 12m q day.   Follow.  Notify me if persistent symptoms.    Hypercholesterolemia On crestor.  Low cholesterol diet and exercise.  Follow lipid panel and liver function tests.    Hyperglycemia Low carb diet and exercise.  Follow met b and a1c.   Hypertension Blood pressures on outside checks under good control.  Continue current medication regimen.  Follow pressures.  Follow metabolic panel.   [place] skin ([place] Followed by dermatology.      I discussed the assessment and treatment plan with the patient. The patient was provided an opportunity to ask questions and all were answered. The patient agreed with the plan and demonstrated an understanding of the instructions.   The patient was advised to call back or seek an in-person evaluation if  the symptoms worsen or if the condition fails to improve as anticipated.   Einar Pheasant, MD

## 2019-02-26 ENCOUNTER — Ambulatory Visit (INDEPENDENT_AMBULATORY_CARE_PROVIDER_SITE_OTHER): Payer: PPO

## 2019-02-26 ENCOUNTER — Other Ambulatory Visit: Payer: Self-pay

## 2019-02-26 DIAGNOSIS — Z23 Encounter for immunization: Secondary | ICD-10-CM

## 2019-02-28 ENCOUNTER — Encounter: Payer: Self-pay | Admitting: Internal Medicine

## 2019-02-28 NOTE — Assessment & Plan Note (Signed)
Acid reflux as outlined.  Increase protonix back up to 40mg  q day.  Follow.  Notify me if persistent symptoms.

## 2019-02-28 NOTE — Assessment & Plan Note (Signed)
On crestor.  Low cholesterol diet and exercise.  Follow lipid panel and liver function tests.   

## 2019-02-28 NOTE — Assessment & Plan Note (Signed)
Followed by dermatology

## 2019-02-28 NOTE — Assessment & Plan Note (Signed)
Low carb diet and exercise.  Follow met b and a1c.

## 2019-02-28 NOTE — Assessment & Plan Note (Signed)
Blood pressures on outside checks under good control.  Continue current medication regimen.  Follow pressures.  Follow metabolic panel.

## 2019-02-28 NOTE — Assessment & Plan Note (Signed)
Followed by oncology.  On armidex.

## 2019-03-01 ENCOUNTER — Encounter: Payer: Self-pay | Admitting: Internal Medicine

## 2019-03-01 ENCOUNTER — Encounter: Payer: Self-pay | Admitting: *Deleted

## 2019-03-01 MED ORDER — ROSUVASTATIN CALCIUM 20 MG PO TABS: ORAL_TABLET | ORAL | 1 refills | Status: DC

## 2019-03-01 MED ORDER — LISINOPRIL 10 MG PO TABS: ORAL_TABLET | ORAL | 1 refills | Status: DC
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
## 2019-03-12 ENCOUNTER — Encounter: Payer: Self-pay | Admitting: Internal Medicine

## 2019-03-15 ENCOUNTER — Encounter: Payer: Self-pay | Admitting: Internal Medicine

## 2019-03-15 ENCOUNTER — Other Ambulatory Visit: Payer: Self-pay

## 2019-03-15 MED ORDER — CLONAZEPAM 0.5 MG PO TABS: 0.5000 mg | ORAL_TABLET | Freq: Every day | ORAL | 1 refills | Status: DC

## 2019-03-15 NOTE — Progress Notes (Signed)
rx ok'd for clonazepam #30 with one refill.   

## 2019-03-15 NOTE — Progress Notes (Signed)
Last OV 02/23/2019 Next OV 06/30/2019 Last refill 01/11/2019  Pt took last pill last night   rx ok'd for clonazepam #30 with one refill.    Dr Nicki Reaper

## 2019-04-12 ENCOUNTER — Encounter: Payer: Self-pay | Admitting: Internal Medicine

## 2019-05-04 ENCOUNTER — Other Ambulatory Visit: Payer: Self-pay

## 2019-05-04 ENCOUNTER — Encounter: Payer: Self-pay | Admitting: Internal Medicine

## 2019-05-04 MED ORDER — HYDROCHLOROTHIAZIDE 25 MG PO TABS: 25.0000 mg | ORAL_TABLET | Freq: Every day | ORAL | 1 refills | Status: DC

## 2019-05-11 ENCOUNTER — Encounter: Payer: Self-pay | Admitting: Internal Medicine

## 2019-05-11 MED ORDER — CLONAZEPAM 0.5 MG PO TABS: 0.5000 mg | ORAL_TABLET | Freq: Every day | ORAL | 1 refills | Status: DC

## 2019-05-11 NOTE — Telephone Encounter (Signed)
rx sent in for clonazepam #30 with one refill.  

## 2019-05-12 ENCOUNTER — Other Ambulatory Visit: Payer: Self-pay

## 2019-05-12 NOTE — Telephone Encounter (Signed)
error 

## 2019-06-28 ENCOUNTER — Other Ambulatory Visit: Payer: PPO

## 2019-06-30 ENCOUNTER — Encounter: Payer: PPO | Admitting: Internal Medicine

## 2019-07-15 ENCOUNTER — Other Ambulatory Visit: Payer: Self-pay

## 2019-07-15 NOTE — Telephone Encounter (Signed)
Refilled: 05/11/2019 Last OV: 02/23/2019 Next OV: 07/30/2019

## 2019-07-16 ENCOUNTER — Encounter: Payer: Self-pay | Admitting: Internal Medicine

## 2019-07-16 DIAGNOSIS — Z1231 Encounter for screening mammogram for malignant neoplasm of breast: Secondary | ICD-10-CM | POA: Diagnosis not present

## 2019-07-16 DIAGNOSIS — N6099 Unspecified benign mammary dysplasia of unspecified breast: Secondary | ICD-10-CM | POA: Diagnosis not present

## 2019-07-16 DIAGNOSIS — N6092 Unspecified benign mammary dysplasia of left breast: Secondary | ICD-10-CM | POA: Diagnosis not present

## 2019-07-16 DIAGNOSIS — I10 Essential (primary) hypertension: Secondary | ICD-10-CM | POA: Diagnosis not present

## 2019-07-16 DIAGNOSIS — Z9289 Personal history of other medical treatment: Secondary | ICD-10-CM | POA: Diagnosis not present

## 2019-07-16 DIAGNOSIS — Z85038 Personal history of other malignant neoplasm of large intestine: Secondary | ICD-10-CM | POA: Diagnosis not present

## 2019-07-16 LAB — HM MAMMOGRAPHY

## 2019-07-16 MED ORDER — CLONAZEPAM 0.5 MG PO TABS: 0.5000 mg | ORAL_TABLET | Freq: Every day | ORAL | 1 refills | Status: DC

## 2019-07-28 ENCOUNTER — Other Ambulatory Visit: Payer: Self-pay

## 2019-07-28 ENCOUNTER — Other Ambulatory Visit (INDEPENDENT_AMBULATORY_CARE_PROVIDER_SITE_OTHER): Payer: PPO

## 2019-07-28 DIAGNOSIS — E7800 Pure hypercholesterolemia, unspecified: Secondary | ICD-10-CM | POA: Diagnosis not present

## 2019-07-28 DIAGNOSIS — I10 Essential (primary) hypertension: Secondary | ICD-10-CM

## 2019-07-28 DIAGNOSIS — R739 Hyperglycemia, unspecified: Secondary | ICD-10-CM | POA: Diagnosis not present

## 2019-07-28 LAB — BASIC METABOLIC PANEL
BUN: 20 mg/dL (ref 6–23)
CO2: 31 mEq/L (ref 19–32)
Calcium: 9.1 mg/dL (ref 8.4–10.5)
Chloride: 106 mEq/L (ref 96–112)
Creatinine, Ser: 0.69 mg/dL (ref 0.40–1.20)
GFR: 84.34 mL/min (ref >60.00)
Glucose, Bld: 98 mg/dL (ref 70–99)
Potassium: 3.6 mEq/L (ref 3.5–5.1)
Sodium: 142 mEq/L (ref 135–145)

## 2019-07-28 LAB — HEPATIC FUNCTION PANEL
ALT: 18 U/L (ref 0–35)
AST: 16 U/L (ref 0–37)
Albumin: 4 g/dL (ref 3.5–5.2)
Alkaline Phosphatase: 63 U/L (ref 39–117)
Bilirubin, Direct: 0.1 mg/dL (ref 0.0–0.3)
Total Bilirubin: 0.5 mg/dL (ref 0.2–1.2)
Total Protein: 6 g/dL (ref 6.0–8.3)

## 2019-07-28 LAB — LIPID PANEL
Cholesterol: 137 mg/dL (ref 0–200)
HDL: 54.9 mg/dL (ref >39.00)
LDL Cholesterol: 71 mg/dL (ref 0–99)
NonHDL: 82.34
Total CHOL/HDL Ratio: 2
Triglycerides: 56 mg/dL (ref 0.0–149.0)
VLDL: 11.2 mg/dL (ref 0.0–40.0)

## 2019-07-28 LAB — HEMOGLOBIN A1C: Hgb A1c MFr Bld: 6.4 % (ref 4.6–6.5)

## 2019-07-29 ENCOUNTER — Encounter: Payer: Self-pay | Admitting: Internal Medicine

## 2019-07-30 ENCOUNTER — Encounter: Payer: PPO | Admitting: Internal Medicine

## 2019-08-10 DIAGNOSIS — L821 Other seborrheic keratosis: Secondary | ICD-10-CM | POA: Diagnosis not present

## 2019-08-10 DIAGNOSIS — L570 Actinic keratosis: Secondary | ICD-10-CM | POA: Diagnosis not present

## 2019-08-10 DIAGNOSIS — D2271 Melanocytic nevi of right lower limb, including hip: Secondary | ICD-10-CM | POA: Diagnosis not present

## 2019-08-10 DIAGNOSIS — Z85820 Personal history of malignant melanoma of skin: Secondary | ICD-10-CM | POA: Diagnosis not present

## 2019-08-10 DIAGNOSIS — X32XXXA Exposure to sunlight, initial encounter: Secondary | ICD-10-CM | POA: Diagnosis not present

## 2019-08-10 DIAGNOSIS — D2262 Melanocytic nevi of left upper limb, including shoulder: Secondary | ICD-10-CM | POA: Diagnosis not present

## 2019-08-10 DIAGNOSIS — D2272 Melanocytic nevi of left lower limb, including hip: Secondary | ICD-10-CM | POA: Diagnosis not present

## 2019-08-10 DIAGNOSIS — D2261 Melanocytic nevi of right upper limb, including shoulder: Secondary | ICD-10-CM | POA: Diagnosis not present

## 2019-08-16 ENCOUNTER — Ambulatory Visit (INDEPENDENT_AMBULATORY_CARE_PROVIDER_SITE_OTHER): Payer: PPO

## 2019-08-16 ENCOUNTER — Ambulatory Visit (INDEPENDENT_AMBULATORY_CARE_PROVIDER_SITE_OTHER): Payer: PPO | Admitting: Internal Medicine

## 2019-08-16 ENCOUNTER — Other Ambulatory Visit: Payer: Self-pay

## 2019-08-16 VITALS — BP 126/70 | HR 68 | Temp 97.3°F | Resp 16 | Ht 64.0 in | Wt 188.0 lb

## 2019-08-16 DIAGNOSIS — M545 Low back pain, unspecified: Secondary | ICD-10-CM | POA: Insufficient documentation

## 2019-08-16 DIAGNOSIS — R739 Hyperglycemia, unspecified: Secondary | ICD-10-CM | POA: Diagnosis not present

## 2019-08-16 DIAGNOSIS — C439 Malignant melanoma of skin, unspecified: Secondary | ICD-10-CM | POA: Diagnosis not present

## 2019-08-16 DIAGNOSIS — M25551 Pain in right hip: Secondary | ICD-10-CM | POA: Diagnosis not present

## 2019-08-16 DIAGNOSIS — I10 Essential (primary) hypertension: Secondary | ICD-10-CM | POA: Diagnosis not present

## 2019-08-16 DIAGNOSIS — Z0000 Encounter for general adult medical examination without abnormal findings: Secondary | ICD-10-CM | POA: Diagnosis not present

## 2019-08-16 DIAGNOSIS — N6099 Unspecified benign mammary dysplasia of unspecified breast: Secondary | ICD-10-CM | POA: Diagnosis not present

## 2019-08-16 DIAGNOSIS — E7800 Pure hypercholesterolemia, unspecified: Secondary | ICD-10-CM | POA: Diagnosis not present

## 2019-08-16 DIAGNOSIS — K219 Gastro-esophageal reflux disease without esophagitis: Secondary | ICD-10-CM

## 2019-08-16 DIAGNOSIS — M47816 Spondylosis without myelopathy or radiculopathy, lumbar region: Secondary | ICD-10-CM | POA: Diagnosis not present

## 2019-08-16 NOTE — Assessment & Plan Note (Signed)
Physical today 08/16/19.  PAP 01/2017.  Colonoscopy 11/2017.

## 2019-08-16 NOTE — Progress Notes (Signed)
Patient ID: Melissa Rhodes, female   DOB: [birth year], 69 y.o.   MRN: [id number]   Subjective:    Patient ID: Melissa Rhodes, female    DOB: 21-Oct-1950, 69 y.o.   MRN: [id number]  HPI  Patient here for her physical exam.  She reports she is doing relatively well.  Previously had low back pain/hip pain.  Previously saw ortho - Dr Roland Rack.  Had MRI.  Reports over the last 3-4 months - increased right hip pain.  Worse with standing.  Tylenol helps.  No known injury.  Tries to stay active.  No chest pain.  Breathing stable.  Previously - protonix increased due to acid reflux.  Stable.  No abdominal pain.  Bowels stable.  Has tried to cut back on sweets and carbs.  Has a history of right breast radial scar and a left breast atypical ductal hyperplasia.  On anastrozole - since 06/2017.  Last evaluated by oncology 07/2019.  Recommended f/u 6 months.     Past Medical History:  Diagnosis Date  . Arthritis 2009  . 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 Cancer [facility]) 2005   colon  . GERD (gastroesophageal reflux disease)   . Hypertension 2010  . Mammographic microcalcification 2013  . Melanoma ([place]) 2019   left arm  . Mitral valve prolapse 2006  . 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 Obesity, unspecified 2014  . Personal history of colonic polyps 2013  . Renal disorder    kidney stones  . Special screening for malignant neoplasms, colon 2014  . Tubal pregnancy    Past Surgical History:  Procedure Laterality Date  . BREAST BIOPSY Left 2013  . BREAST BIOPSY Right 03/06/2017   LIQ coil clip. FEATURES OF RADIAL SCAR  . BREAST SURGERY Left 2013   re excision of left breast showed small area of residual ADH, but she was not upsatged to DCIS or invasive cancer. The margins were clear.  Patient declined tamoxifen therapy  . COLONOSCOPY  [id number]  . COLONOSCOPY W/ POLYPECTOMY  2014  . COLONOSCOPY WITH PROPOFOL N/A 11/19/2017   Procedure: COLONOSCOPY WITH PROPOFOL;   Surgeon: Robert Bellow, MD;  Location: [facility] ENDOSCOPY;  Service: Endoscopy;  Laterality: N/A;  . ECTOPIC PREGNANCY SURGERY  1981  . ESOPHAGOGASTRODUODENOSCOPY (EGD) WITH PROPOFOL N/A 11/19/2017   Procedure: ESOPHAGOGASTRODUODENOSCOPY (EGD) WITH PROPOFOL;  Surgeon: Robert Bellow, MD;  Location: [facility] ENDOSCOPY;  Service: Endoscopy;  Laterality: N/A;  . kidney stones  2009  . SHOULDER SURGERY Right 2007  . TUBAL LIGATION  1981   Family History  Problem Relation Age of Onset  . Cancer Father 100       lung  . Early death Father   . Arthritis Mother   . Depression Mother   . Diabetes Mother   . Heart disease Mother   . Hypertension Mother   . Hyperlipidemia Mother   . Stroke Mother   . Depression Sister   . Diabetes Sister   . Hyperlipidemia Sister   . Hypertension Sister   . Depression Sister   . Hyperlipidemia Sister   . Hypertension Sister   . Miscarriages / Stillbirths Sister   . Depression Sister   . Hyperlipidemia Sister   . Hypertension Sister   . Depression Brother   . Diabetes Brother   . Hyperlipidemia Brother   . Hypertension Brother   . Stroke Brother   . Breast cancer Neg Hx   .  Ovarian cancer Neg Hx    Social History   Socioeconomic History  . Marital status: Married    Spouse name: Not on file  . Number of children: Not on file  . Years of education: Not on file  . Highest education level: Not on file  Occupational History  . Not on file  Tobacco Use  . Smoking status: Never Smoker  . Smokeless tobacco: Never Used  Substance and Sexual Activity  . Alcohol use: No  . Drug use: No  . Sexual activity: Never  Other Topics Concern  . Not on file  Social History Narrative  . Not on file   Social Determinants of Health   Financial Resource Strain:   . Difficulty of Paying Living Expenses:   Food Insecurity:   . Worried About Charity fundraiser in the Last Year:   . Arboriculturist in the Last Year:   Transportation Needs:   . Lexicographer (Medical):   Marland Kitchen Lack of Transportation (Non-Medical):   Physical Activity:   . Days of Exercise per Week:   . Minutes of Exercise per Session:   Stress:   . Feeling of Stress :   Social Connections:   . Frequency of Communication with Friends and Family:   . Frequency of Social Gatherings with Friends and Family:   . Attends Religious Services:   . Active Member of Clubs or Organizations:   . Attends Archivist Meetings:   Marland Kitchen Marital Status:     Outpatient Encounter Medications as of 08/16/2019  Medication Sig  . cholecalciferol (VITAMIN D3) 25 MCG (1000 UNIT) tablet Take 1,000 Units by mouth daily.  Marland Kitchen anastrozole (ARIMIDEX) 1 MG tablet Take 1 mg by mouth daily.   Marland Kitchen aspirin 81 MG tablet Take 81 mg by mouth daily.   . clonazePAM (KLONOPIN) 0.5 MG tablet Take 1 tablet (0.5 mg total) by mouth daily.  . fluocinonide cream (LIDEX) 0.05 % Use as directed  . hydrochlorothiazide (HYDRODIURIL) 25 MG tablet Take 1 tablet (25 mg total) by mouth daily.  Marland Kitchen lisinopril (ZESTRIL) 10 MG tablet TAKE 1 TABLET(10 MG) BY MOUTH DAILY  . pantoprazole (PROTONIX) 40 MG tablet Take 1 tablet (40 mg total) by mouth daily.  . rosuvastatin (CRESTOR) 20 MG tablet TAKE 1 TABLET(20 MG) BY MOUTH DAILY  . [DISCONTINUED] potassium chloride (K-DUR) 10 MEQ tablet TAKE 1 TABLET(10 MEQ) BY MOUTH TWICE DAILY  . [DISCONTINUED] potassium chloride (MICRO-K) 10 MEQ CR capsule Take 2 capsules (20 mEq total) by mouth daily.  . [DISCONTINUED] rOPINIRole (REQUIP) 1 MG tablet Take 1 tablet (1 mg total) by mouth at bedtime.   No facility-administered encounter medications on file as of 08/16/2019.   Review of Systems  Constitutional: Negative for appetite change and unexpected weight change.  HENT: Negative for congestion and sinus pressure.   Eyes: Negative for pain and visual disturbance.  Respiratory: Negative for cough, chest tightness and shortness of breath.   Cardiovascular: Negative for chest pain,  palpitations and leg swelling.  Gastrointestinal: Negative for abdominal pain, diarrhea, nausea and vomiting.  Genitourinary: Negative for difficulty urinating and dysuria.  Musculoskeletal: Negative for joint swelling and myalgias.       Hip pain as outlined.  Previous back issues as outlined.   Skin: Negative for color change and rash.  Neurological: Negative for dizziness, light-headedness and headaches.  Hematological: Negative for adenopathy. Does not bruise/bleed easily.  Psychiatric/Behavioral: Negative for agitation and dysphoric mood.  Objective:    Physical Exam Constitutional:      General: She is not in acute distress.    Appearance: Normal appearance. She is well-developed.  HENT:     Head: Normocephalic and atraumatic.     Right Ear: External ear normal.     Left Ear: External ear normal.  Eyes:     General: No scleral icterus.       Right eye: No discharge.        Left eye: No discharge.     Conjunctiva/sclera: Conjunctivae normal.  Neck:     Thyroid: No thyromegaly.  Cardiovascular:     Rate and Rhythm: Normal rate and regular rhythm.  Pulmonary:     Effort: No tachypnea, accessory muscle usage or respiratory distress.     Breath sounds: Normal breath sounds. No decreased breath sounds or wheezing.  Chest:     Breasts:        Right: No inverted nipple, mass, nipple discharge or tenderness (no axillary adenopathy).        Left: No inverted nipple, mass, nipple discharge or tenderness (no axilarry adenopathy).  Abdominal:     General: Bowel sounds are normal.     Palpations: Abdomen is soft.     Tenderness: There is no abdominal tenderness.  Musculoskeletal:        General: No swelling or tenderness.     Cervical back: Neck supple. No tenderness.  Lymphadenopathy:     Cervical: No cervical adenopathy.  Skin:    Findings: No erythema or rash.  Neurological:     Mental Status: She is alert and oriented to person, place, and time.  Psychiatric:         Mood and Affect: Mood normal.        Behavior: Behavior normal.     BP 126/70   Pulse 68   Temp (!) 97.3 F (36.3 C)   Resp 16   Ht '5\' 4"'$  (1.626 m)   Wt 188 lb (85.3 kg)   SpO2 98%   BMI 32.27 kg/m  Wt Readings from Last 3 Encounters:  08/16/19 188 lb (85.3 kg)  01/11/19 185 lb 9.6 oz (84.2 kg)  05/26/18 181 lb 9.6 oz (82.4 kg)     Lab Results  Component Value Date   WBC 4.3 05/25/2018   HGB 12.9 05/25/2018   HCT 38.3 05/25/2018   PLT 200.0 05/25/2018   GLUCOSE 98 07/28/2019   CHOL 137 07/28/2019   TRIG 56.0 07/28/2019   HDL 54.90 07/28/2019   LDLCALC 71 07/28/2019   ALT 18 07/28/2019   AST 16 07/28/2019   NA 142 07/28/2019   K 3.6 07/28/2019   CL 106 07/28/2019   CREATININE 0.69 07/28/2019   BUN 20 07/28/2019   CO2 31 07/28/2019   TSH 3.92 05/25/2018   HGBA1C 6.4 07/28/2019      Assessment & Plan:   Problem List Items Addressed This Visit    Atypical ductal hyperplasia of breast    Followed by oncology.  Last checked 07/2019. On arimidex.  Stable.  Recommended f/u in 6 months.       GERD (gastroesophageal reflux disease)    On protonix.  Upper symptoms appear to be controlled.  Follow.       Healthcare maintenance    Physical today 08/16/19.  PAP 01/2017.  Colonoscopy 11/2017.        Hypercholesterolemia    On crestor.  Low cholesterol diet and exercise.  Follow lipid panel and liver  function tests.        Relevant Orders   Hepatic function panel   Lipid panel   Hyperglycemia    Low carb diet and exercise.  Follow met b and a1c.       Relevant Orders   Hemoglobin A1c   Hypertension    Blood pressure as outlined.  On lisinopril.  Follow pressures.  Follow metabolic panel.       Relevant Orders   CBC with Differential/Platelet   TSH   Basic metabolic panel   Low back pain    Low back pain/right hip pain as outlined.  Has been present and gradually worsening for 3-4 months.  Check xray.  Continue tylenol.        Relevant Orders   DG  Lumbar Spine 2-3 Views (Completed)   Melanoma of skin ([place])    Followed by dermatology.       Right hip pain    Persistent pain as outlined.  Check xray.        Relevant Orders   DG HIP UNILAT WITH PELVIS 2-3 VIEWS RIGHT (Completed)    Other Visit Diagnoses    Routine general medical examination at a health care facility    -  Primary       Einar Pheasant, MD

## 2019-08-17 ENCOUNTER — Other Ambulatory Visit: Payer: Self-pay

## 2019-08-17 ENCOUNTER — Other Ambulatory Visit: Payer: Self-pay | Admitting: Internal Medicine

## 2019-08-17 DIAGNOSIS — M25551 Pain in right hip: Secondary | ICD-10-CM

## 2019-08-17 MED ORDER — POTASSIUM CHLORIDE ER 10 MEQ PO CPCR: 20.0000 meq | ORAL_CAPSULE | Freq: Every day | ORAL | 1 refills | Status: DC

## 2019-08-17 MED ORDER — ROPINIROLE HCL 1 MG PO TABS: 1.0000 mg | ORAL_TABLET | Freq: Every day | ORAL | 1 refills | Status: DC

## 2019-08-17 NOTE — Progress Notes (Signed)
Order placed for ortho referral.   

## 2019-08-22 ENCOUNTER — Encounter: Payer: Self-pay | Admitting: Internal Medicine

## 2019-08-22 NOTE — Assessment & Plan Note (Signed)
Followed by oncology.  Last checked 07/2019. On arimidex.  Stable.  Recommended f/u in 6 months.

## 2019-08-22 NOTE — Assessment & Plan Note (Signed)
Persistent pain as outlined.  Check xray.

## 2019-08-22 NOTE — Assessment & Plan Note (Signed)
Blood pressure as outlined.  On lisinopril.  Follow pressures.  Follow metabolic panel.  

## 2019-08-22 NOTE — Assessment & Plan Note (Signed)
Low carb diet and exercise.  Follow met b and a1c.  

## 2019-08-22 NOTE — Assessment & Plan Note (Signed)
On crestor.  Low cholesterol diet and exercise.  Follow lipid panel and liver function tests.   

## 2019-08-22 NOTE — Assessment & Plan Note (Signed)
Low back pain/right hip pain as outlined.  Has been present and gradually worsening for 3-4 months.  Check xray.  Continue tylenol.

## 2019-08-22 NOTE — Assessment & Plan Note (Signed)
On protonix.  Upper symptoms appear to be controlled.  Follow.

## 2019-08-22 NOTE — Assessment & Plan Note (Signed)
Followed by dermatology

## 2019-08-23 ENCOUNTER — Other Ambulatory Visit: Payer: Self-pay

## 2019-08-23 ENCOUNTER — Encounter: Payer: Self-pay | Admitting: Internal Medicine

## 2019-08-23 MED ORDER — PANTOPRAZOLE SODIUM 40 MG PO TBEC: 40.0000 mg | DELAYED_RELEASE_TABLET | Freq: Every day | ORAL | 1 refills | Status: DC

## 2019-08-23 MED ORDER — LISINOPRIL 10 MG PO TABS: ORAL_TABLET | ORAL | 1 refills | Status: DC

## 2019-08-23 MED ORDER — ROSUVASTATIN CALCIUM 20 MG PO TABS: ORAL_TABLET | ORAL | 1 refills | Status: DC

## 2019-09-08 ENCOUNTER — Encounter: Payer: Self-pay | Admitting: Internal Medicine

## 2019-09-11 ENCOUNTER — Telehealth: Payer: Self-pay | Admitting: Internal Medicine

## 2019-09-11 NOTE — Telephone Encounter (Signed)
See pts my chart message.  Brock sent message to me 09/12/19.  I tried to call her over the weekend.  Unable to reach.  Left her a message and left my chart message.  Please call and confirm doing ok and see if followed up with covid testing.  Thanks

## 2019-09-13 ENCOUNTER — Other Ambulatory Visit: Payer: Self-pay | Admitting: Internal Medicine

## 2019-09-13 NOTE — Telephone Encounter (Signed)
Reviewed.  Pt to be evaluated if persistent symptoms or any problems.  Clonazepam rx ok'd for #30 with one refill - sent in to pharmacy.

## 2019-09-13 NOTE — Telephone Encounter (Signed)
Refill request for Klonopin, last seen 08-16-19, last filled 07-16-19.  Please advise.

## 2019-09-13 NOTE — Telephone Encounter (Signed)
Patient called back and stated she is feeling better but did not go get tested. She is now 15 days since symptom onset. Wanted to confirm if she still needs to be tested. She is having some mild cough and fatigue but no other symptoms noted.

## 2019-09-13 NOTE — Telephone Encounter (Signed)
Patient returning Dr. Bary Leriche phone call from weekend. Patient is asking for Dr. Nicki Reaper to call her back.

## 2019-09-13 NOTE — Telephone Encounter (Signed)
Given that she is 15 days out and better, can hold on testing (unless she wants to see if had covid).  Continue to follow symptoms and let us know if needs anything or if symptoms do not continue to improve/resolve.  Confirm no sob.

## 2019-09-13 NOTE — Telephone Encounter (Signed)
Confirmed no sob. Symptoms better. Patient would like to hold on testing and monitor. Pt noted while on the phone that she does need her clonazepam refilled. Pt stated that her sister passed away over the weekend. Confirmed doing ok. Remer Macho is Wednesday. Her last refill was 07/16/19.

## 2019-09-13 NOTE — Telephone Encounter (Signed)
rx ok'd for clonazepam #30 with one refill.   

## 2019-09-21 ENCOUNTER — Telehealth: Payer: Self-pay | Admitting: Internal Medicine

## 2019-09-21 NOTE — Telephone Encounter (Signed)
NO ANSWER/MAILBOX FULL. Pt due to schedule Medicare Annual Wellness Visit (AWV) either virtually or audio only.  No hx of AWV; please schedule at anytime with Denisa O'Brien-Blaney at [facility]

## 2019-10-30 ENCOUNTER — Other Ambulatory Visit: Payer: Self-pay | Admitting: Internal Medicine

## 2019-11-01 ENCOUNTER — Encounter: Payer: Self-pay | Admitting: Internal Medicine

## 2019-11-01 MED ORDER — HYDROCHLOROTHIAZIDE 25 MG PO TABS: ORAL_TABLET | ORAL | 1 refills | Status: DC

## 2019-11-11 ENCOUNTER — Encounter: Payer: Self-pay | Admitting: Internal Medicine

## 2019-11-12 ENCOUNTER — Telehealth: Payer: Self-pay

## 2019-11-12 ENCOUNTER — Other Ambulatory Visit: Payer: Self-pay

## 2019-11-12 ENCOUNTER — Other Ambulatory Visit (INDEPENDENT_AMBULATORY_CARE_PROVIDER_SITE_OTHER): Payer: PPO

## 2019-11-12 DIAGNOSIS — I10 Essential (primary) hypertension: Secondary | ICD-10-CM | POA: Diagnosis not present

## 2019-11-12 DIAGNOSIS — Z20822 Contact with and (suspected) exposure to covid-19: Secondary | ICD-10-CM

## 2019-11-12 DIAGNOSIS — R739 Hyperglycemia, unspecified: Secondary | ICD-10-CM

## 2019-11-12 DIAGNOSIS — E7800 Pure hypercholesterolemia, unspecified: Secondary | ICD-10-CM

## 2019-11-12 LAB — CBC WITH DIFFERENTIAL/PLATELET
Basophils Absolute: 0 10*3/uL (ref 0.0–0.1)
Basophils Relative: 0.4 % (ref 0.0–3.0)
Eosinophils Absolute: 0 10*3/uL (ref 0.0–0.7)
Eosinophils Relative: 1.6 % (ref 0.0–5.0)
HCT: 38.1 % (ref 36.0–46.0)
Hemoglobin: 13.2 g/dL (ref 12.0–15.0)
Lymphocytes Relative: 28.8 % (ref 12.0–46.0)
Lymphs Abs: 0.9 10*3/uL (ref 0.7–4.0)
MCHC: 34.5 g/dL (ref 30.0–36.0)
MCV: 85.3 fl (ref 78.0–100.0)
Monocytes Absolute: 0.3 10*3/uL (ref 0.1–1.0)
Monocytes Relative: 10.2 % (ref 3.0–12.0)
Neutro Abs: 1.8 10*3/uL (ref 1.4–7.7)
Neutrophils Relative %: 59 % (ref 43.0–77.0)
Platelets: 146 10*3/uL — ABNORMAL LOW (ref 150.0–400.0)
RBC: 4.47 Mil/uL (ref 3.87–5.11)
RDW: 14.6 % (ref 11.5–15.5)
WBC: 3 10*3/uL — ABNORMAL LOW (ref 4.0–10.5)

## 2019-11-12 LAB — BASIC METABOLIC PANEL
BUN: 20 mg/dL (ref 6–23)
CO2: 30 mEq/L (ref 19–32)
Calcium: 9.4 mg/dL (ref 8.4–10.5)
Chloride: 104 mEq/L (ref 96–112)
Creatinine, Ser: 0.67 mg/dL (ref 0.40–1.20)
GFR: 87.17 mL/min (ref >60.00)
Glucose, Bld: 98 mg/dL (ref 70–99)
Potassium: 3.7 mEq/L (ref 3.5–5.1)
Sodium: 141 mEq/L (ref 135–145)

## 2019-11-12 LAB — SARS-COV-2 IGG: SARS-COV-2 IgG: 2.28

## 2019-11-12 LAB — HEPATIC FUNCTION PANEL
ALT: 14 U/L (ref 0–35)
AST: 16 U/L (ref 0–37)
Albumin: 4.3 g/dL (ref 3.5–5.2)
Alkaline Phosphatase: 69 U/L (ref 39–117)
Bilirubin, Direct: 0.1 mg/dL (ref 0.0–0.3)
Total Bilirubin: 0.6 mg/dL (ref 0.2–1.2)
Total Protein: 6 g/dL (ref 6.0–8.3)

## 2019-11-12 LAB — LIPID PANEL
Cholesterol: 133 mg/dL (ref 0–200)
HDL: 48.7 mg/dL (ref >39.00)
LDL Cholesterol: 73 mg/dL (ref 0–99)
NonHDL: 84.69
Total CHOL/HDL Ratio: 3
Triglycerides: 59 mg/dL (ref 0.0–149.0)
VLDL: 11.8 mg/dL (ref 0.0–40.0)

## 2019-11-12 LAB — TSH: TSH: 3.63 u[IU]/mL (ref 0.35–4.50)

## 2019-11-12 LAB — HEMOGLOBIN A1C: Hgb A1c MFr Bld: 5.4 % (ref 4.6–6.5)

## 2019-11-12 NOTE — Telephone Encounter (Signed)
I ordered the COVID antibody test future per patient request. Please add to lab work done today.

## 2019-11-12 NOTE — Addendum Note (Signed)
Addended by: Tor Netters I on: 11/12/2019 10:06 AM   Modules accepted: Orders

## 2019-11-12 NOTE — Telephone Encounter (Signed)
Will do! Thank you Larena Glassman

## 2019-11-16 ENCOUNTER — Ambulatory Visit (INDEPENDENT_AMBULATORY_CARE_PROVIDER_SITE_OTHER): Payer: PPO | Admitting: Internal Medicine

## 2019-11-16 ENCOUNTER — Other Ambulatory Visit: Payer: Self-pay

## 2019-11-16 VITALS — BP 118/70 | HR 74 | Temp 98.0°F | Resp 16 | Ht 64.0 in | Wt 171.4 lb

## 2019-11-16 DIAGNOSIS — K219 Gastro-esophageal reflux disease without esophagitis: Secondary | ICD-10-CM | POA: Diagnosis not present

## 2019-11-16 DIAGNOSIS — N6099 Unspecified benign mammary dysplasia of unspecified breast: Secondary | ICD-10-CM | POA: Diagnosis not present

## 2019-11-16 DIAGNOSIS — E7800 Pure hypercholesterolemia, unspecified: Secondary | ICD-10-CM

## 2019-11-16 DIAGNOSIS — C439 Malignant melanoma of skin, unspecified: Secondary | ICD-10-CM

## 2019-11-16 DIAGNOSIS — I10 Essential (primary) hypertension: Secondary | ICD-10-CM

## 2019-11-16 DIAGNOSIS — M25551 Pain in right hip: Secondary | ICD-10-CM

## 2019-11-16 DIAGNOSIS — D72819 Decreased white blood cell count, unspecified: Secondary | ICD-10-CM

## 2019-11-16 DIAGNOSIS — R739 Hyperglycemia, unspecified: Secondary | ICD-10-CM
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
## 2019-11-18 ENCOUNTER — Encounter: Payer: Self-pay | Admitting: Internal Medicine

## 2019-11-18 ENCOUNTER — Other Ambulatory Visit: Payer: Self-pay | Admitting: Internal Medicine

## 2019-11-19 ENCOUNTER — Telehealth: Payer: Self-pay | Admitting: Internal Medicine

## 2019-11-19 NOTE — Telephone Encounter (Signed)
rx sent in for clonazepam.  Pt notified via my chart.  °

## 2019-11-19 NOTE — Telephone Encounter (Signed)
Refill request for klonopin, last seen 08-17-19, last filled 09-13-19.  Please advise.

## 2019-11-19 NOTE — Telephone Encounter (Signed)
rx ok'd for clonazepam #30 with 1 refill.  

## 2019-11-19 NOTE — Telephone Encounter (Signed)
2nd attempt to contact, LVM asking the patient to call back to schedule an appointment, 09/24/19 at 10:37 AM." [facility] said about 2 months ago  "LVM asking the patient to call back to schedule an appointment, 08/24/19 at 11:27AM." [facility] said 3 months ago  "  Mailed referral letter, 10/26/19." [facility] said 24 days ago  "Rejection Reason - Patient did not respond" [facility] said about 2 hours ago

## 2019-11-23 ENCOUNTER — Encounter: Payer: Self-pay | Admitting: Internal Medicine

## 2019-11-23 NOTE — Assessment & Plan Note (Signed)
Low carb diet and exercise.  Follow met b and a1c.  

## 2019-11-23 NOTE — Assessment & Plan Note (Signed)
On crestor.  Has adjusted her diet.  Lost weight.  Follow lipid panel and liver function tests.

## 2019-11-23 NOTE — Assessment & Plan Note (Signed)
Followed by oncology.  Last evaluated 07/2019.  On arimidex.  Follow.  Stable.  Recommended f/u in 6 months.

## 2019-11-23 NOTE — Assessment & Plan Note (Signed)
Followed by dermatology

## 2019-11-23 NOTE — Assessment & Plan Note (Signed)
No upper symptoms.  On protonix.  Follow.

## 2019-11-23 NOTE — Assessment & Plan Note (Signed)
Blood pressure doing well on lisinopril and hctz.  Follow pressures.  Follow metabolic panel.

## 2019-11-23 NOTE — Assessment & Plan Note (Signed)
Hip is better.  

## 2019-12-16 ENCOUNTER — Other Ambulatory Visit: Payer: Self-pay

## 2019-12-16 ENCOUNTER — Other Ambulatory Visit (INDEPENDENT_AMBULATORY_CARE_PROVIDER_SITE_OTHER): Payer: PPO

## 2019-12-16 DIAGNOSIS — D72819 Decreased white blood cell count, unspecified: Secondary | ICD-10-CM | POA: Diagnosis not present

## 2019-12-16 LAB — CBC WITH DIFFERENTIAL/PLATELET
Basophils Absolute: 0 10*3/uL (ref 0.0–0.1)
Basophils Relative: 0.4 % (ref 0.0–3.0)
Eosinophils Absolute: 0.1 10*3/uL (ref 0.0–0.7)
Eosinophils Relative: 1.5 % (ref 0.0–5.0)
HCT: 37 % (ref 36.0–46.0)
Hemoglobin: 12.5 g/dL (ref 12.0–15.0)
Lymphocytes Relative: 21.8 % (ref 12.0–46.0)
Lymphs Abs: 0.7 10*3/uL (ref 0.7–4.0)
MCHC: 34 g/dL (ref 30.0–36.0)
MCV: 86.6 fl (ref 78.0–100.0)
Monocytes Absolute: 0.4 10*3/uL (ref 0.1–1.0)
Monocytes Relative: 11.4 % (ref 3.0–12.0)
Neutro Abs: 2.2 10*3/uL (ref 1.4–7.7)
Neutrophils Relative %: 64.9 % (ref 43.0–77.0)
Platelets: 142 10*3/uL — ABNORMAL LOW (ref 150.0–400.0)
RBC: 4.27 Mil/uL (ref 3.87–5.11)
RDW: 14.7 % (ref 11.5–15.5)
WBC: 3.4 10*3/uL — ABNORMAL LOW (ref 4.0–10.5)

## 2019-12-17 ENCOUNTER — Other Ambulatory Visit: Payer: Self-pay | Admitting: Internal Medicine

## 2019-12-17 DIAGNOSIS — D72819 Decreased white blood cell count, unspecified: Secondary | ICD-10-CM

## 2019-12-17 DIAGNOSIS — D696 Thrombocytopenia, unspecified: Secondary | ICD-10-CM

## 2019-12-17 NOTE — Progress Notes (Signed)
Order placed for f/u [facility].   

## 2020-01-13 ENCOUNTER — Encounter: Payer: Self-pay | Admitting: Internal Medicine

## 2020-01-13 MED ORDER — CLONAZEPAM 0.5 MG PO TABS: 0.5000 mg | ORAL_TABLET | Freq: Every day | ORAL | 1 refills | Status: DC | PRN

## 2020-01-13 NOTE — Telephone Encounter (Signed)
rx sent in for clonazepam #30 with one refill.

## 2020-01-14 DIAGNOSIS — Z1231 Encounter for screening mammogram for malignant neoplasm of breast: Secondary | ICD-10-CM | POA: Diagnosis not present

## 2020-01-14 DIAGNOSIS — N6099 Unspecified benign mammary dysplasia of unspecified breast: Secondary | ICD-10-CM | POA: Diagnosis not present

## 2020-01-17 ENCOUNTER — Other Ambulatory Visit: Payer: Self-pay

## 2020-01-17 ENCOUNTER — Other Ambulatory Visit (INDEPENDENT_AMBULATORY_CARE_PROVIDER_SITE_OTHER): Payer: PPO

## 2020-01-17 DIAGNOSIS — R739 Hyperglycemia, unspecified: Secondary | ICD-10-CM | POA: Diagnosis not present

## 2020-01-17 DIAGNOSIS — I10 Essential (primary) hypertension: Secondary | ICD-10-CM | POA: Diagnosis not present

## 2020-01-17 DIAGNOSIS — D696 Thrombocytopenia, unspecified: Secondary | ICD-10-CM | POA: Diagnosis not present

## 2020-01-17 DIAGNOSIS — E7800 Pure hypercholesterolemia, unspecified: Secondary | ICD-10-CM

## 2020-01-17 DIAGNOSIS — D72819 Decreased white blood cell count, unspecified: Secondary | ICD-10-CM | POA: Diagnosis not present

## 2020-01-17 LAB — CBC WITH DIFFERENTIAL/PLATELET
Basophils Absolute: 0 10*3/uL (ref 0.0–0.1)
Basophils Relative: 0.6 % (ref 0.0–3.0)
Eosinophils Absolute: 0 10*3/uL (ref 0.0–0.7)
Eosinophils Relative: 1.4 % (ref 0.0–5.0)
HCT: 37.8 % (ref 36.0–46.0)
Hemoglobin: 12.7 g/dL (ref 12.0–15.0)
Lymphocytes Relative: 22 % (ref 12.0–46.0)
Lymphs Abs: 0.7 10*3/uL (ref 0.7–4.0)
MCHC: 33.7 g/dL (ref 30.0–36.0)
MCV: 86.3 fl (ref 78.0–100.0)
Monocytes Absolute: 0.3 10*3/uL (ref 0.1–1.0)
Monocytes Relative: 9.6 % (ref 3.0–12.0)
Neutro Abs: 2.3 10*3/uL (ref 1.4–7.7)
Neutrophils Relative %: 66.4 % (ref 43.0–77.0)
Platelets: 139 10*3/uL — ABNORMAL LOW (ref 150.0–400.0)
RBC: 4.37 Mil/uL (ref 3.87–5.11)
RDW: 14.2 % (ref 11.5–15.5)
WBC: 3.4 10*3/uL — ABNORMAL LOW (ref 4.0–10.5)

## 2020-01-17 LAB — HEPATIC FUNCTION PANEL
ALT: 17 U/L (ref 0–35)
AST: 18 U/L (ref 0–37)
Albumin: 4.2 g/dL (ref 3.5–5.2)
Alkaline Phosphatase: 65 U/L (ref 39–117)
Bilirubin, Direct: 0.1 mg/dL (ref 0.0–0.3)
Total Bilirubin: 0.6 mg/dL (ref 0.2–1.2)
Total Protein: 6 g/dL (ref 6.0–8.3)

## 2020-01-17 LAB — LIPID PANEL
Cholesterol: 135 mg/dL (ref 0–200)
HDL: 50.9 mg/dL
LDL Cholesterol: 72 mg/dL (ref 0–99)
NonHDL: 84.13
Total CHOL/HDL Ratio: 3
Triglycerides: 61 mg/dL (ref 0.0–149.0)
VLDL: 12.2 mg/dL (ref 0.0–40.0)

## 2020-01-17 LAB — BASIC METABOLIC PANEL WITH GFR
BUN: 18 mg/dL (ref 6–23)
CO2: 27 meq/L (ref 19–32)
Calcium: 9.3 mg/dL (ref 8.4–10.5)
Chloride: 105 meq/L (ref 96–112)
Creatinine, Ser: 0.76 mg/dL (ref 0.40–1.20)
GFR: 75.33 mL/min
Glucose, Bld: 80 mg/dL (ref 70–99)
Potassium: 3.6 meq/L (ref 3.5–5.1)
Sodium: 142 meq/L (ref 135–145)

## 2020-01-17 LAB — HEMOGLOBIN A1C: Hgb A1c MFr Bld: 5.6 % (ref 4.6–6.5)

## 2020-01-18 ENCOUNTER — Other Ambulatory Visit: Payer: Self-pay | Admitting: Internal Medicine

## 2020-01-18 DIAGNOSIS — D72819 Decreased white blood cell count, unspecified: Secondary | ICD-10-CM

## 2020-01-18 DIAGNOSIS — D696 Thrombocytopenia, unspecified: Secondary | ICD-10-CM

## 2020-01-18 NOTE — Progress Notes (Signed)
Order placed for f/u labs.  

## 2020-02-08 ENCOUNTER — Other Ambulatory Visit: Payer: Self-pay | Admitting: Internal Medicine

## 2020-02-10 DIAGNOSIS — Z09 Encounter for follow-up examination after completed treatment for conditions other than malignant neoplasm: Secondary | ICD-10-CM | POA: Diagnosis not present

## 2020-02-10 DIAGNOSIS — Z85820 Personal history of malignant melanoma of skin: Secondary | ICD-10-CM | POA: Diagnosis not present

## 2020-02-10 DIAGNOSIS — D2261 Melanocytic nevi of right upper limb, including shoulder: Secondary | ICD-10-CM | POA: Diagnosis not present

## 2020-02-10 DIAGNOSIS — D2271 Melanocytic nevi of right lower limb, including hip: Secondary | ICD-10-CM | POA: Diagnosis not present

## 2020-02-10 DIAGNOSIS — D225 Melanocytic nevi of trunk: Secondary | ICD-10-CM | POA: Diagnosis not present

## 2020-02-10 DIAGNOSIS — D2262 Melanocytic nevi of left upper limb, including shoulder: Secondary | ICD-10-CM | POA: Diagnosis not present

## 2020-02-16 ENCOUNTER — Other Ambulatory Visit: Payer: Self-pay

## 2020-02-16 ENCOUNTER — Other Ambulatory Visit (INDEPENDENT_AMBULATORY_CARE_PROVIDER_SITE_OTHER): Payer: PPO

## 2020-02-16 DIAGNOSIS — D696 Thrombocytopenia, unspecified: Secondary | ICD-10-CM

## 2020-02-16 DIAGNOSIS — D72819 Decreased white blood cell count, unspecified: Secondary | ICD-10-CM | POA: Diagnosis not present

## 2020-02-16 LAB — CBC WITH DIFFERENTIAL/PLATELET
Basophils Absolute: 0 10*3/uL (ref 0.0–0.1)
Basophils Relative: 0.5 % (ref 0.0–3.0)
Eosinophils Absolute: 0.1 10*3/uL (ref 0.0–0.7)
Eosinophils Relative: 2.3 % (ref 0.0–5.0)
HCT: 37.7 % (ref 36.0–46.0)
Hemoglobin: 12.7 g/dL (ref 12.0–15.0)
Lymphocytes Relative: 25.8 % (ref 12.0–46.0)
Lymphs Abs: 0.9 10*3/uL (ref 0.7–4.0)
MCHC: 33.6 g/dL (ref 30.0–36.0)
MCV: 86.5 fl (ref 78.0–100.0)
Monocytes Absolute: 0.4 10*3/uL (ref 0.1–1.0)
Monocytes Relative: 12.2 % — ABNORMAL HIGH (ref 3.0–12.0)
Neutro Abs: 2.1 10*3/uL (ref 1.4–7.7)
Neutrophils Relative %: 59.2 % (ref 43.0–77.0)
Platelets: 138 10*3/uL — ABNORMAL LOW (ref 150.0–400.0)
RBC: 4.36 Mil/uL (ref 3.87–5.11)
RDW: 14.1 % (ref 11.5–15.5)
WBC: 3.5 10*3/uL — ABNORMAL LOW (ref 4.0–10.5)

## 2020-02-16 LAB — VITAMIN B12: Vitamin B-12: 188 pg/mL — ABNORMAL LOW (ref 211–911)

## 2020-02-21 ENCOUNTER — Other Ambulatory Visit: Payer: Self-pay | Admitting: Internal Medicine

## 2020-02-23 ENCOUNTER — Ambulatory Visit (INDEPENDENT_AMBULATORY_CARE_PROVIDER_SITE_OTHER): Payer: PPO

## 2020-02-23 ENCOUNTER — Other Ambulatory Visit: Payer: Self-pay

## 2020-02-23 DIAGNOSIS — E538 Deficiency of other specified B group vitamins: Secondary | ICD-10-CM

## 2020-02-23 MED ORDER — CYANOCOBALAMIN 1000 MCG/ML IJ SOLN
1000.0000 ug | Freq: Once | INTRAMUSCULAR | Status: AC
  Administered 2020-02-23: 1000 ug via INTRAMUSCULAR

## 2020-02-23 NOTE — Progress Notes (Addendum)
Patient presented for B 12 injection to right deltoid, patient voiced no concerns nor showed any signs of distress during injection.  Reviewed.  Dr Scott 

## 2020-03-01 ENCOUNTER — Other Ambulatory Visit: Payer: Self-pay

## 2020-03-01 ENCOUNTER — Ambulatory Visit (INDEPENDENT_AMBULATORY_CARE_PROVIDER_SITE_OTHER): Payer: PPO

## 2020-03-01 DIAGNOSIS — E538 Deficiency of other specified B group vitamins: Secondary | ICD-10-CM

## 2020-03-01 MED ORDER — CYANOCOBALAMIN 1000 MCG/ML IJ SOLN
1000.0000 ug | Freq: Once | INTRAMUSCULAR | Status: AC
  Administered 2020-03-01: 1000 ug via INTRAMUSCULAR

## 2020-03-01 NOTE — Progress Notes (Addendum)
Patient presented for B 12 injection to right deltoid, patient voiced no concerns nor showed any signs of distress during injection.  Reviewed.  Dr Scott 

## 2020-03-08 ENCOUNTER — Other Ambulatory Visit: Payer: Self-pay

## 2020-03-08 ENCOUNTER — Ambulatory Visit (INDEPENDENT_AMBULATORY_CARE_PROVIDER_SITE_OTHER): Payer: PPO

## 2020-03-08 DIAGNOSIS — E538 Deficiency of other specified B group vitamins: Secondary | ICD-10-CM | POA: Diagnosis not present

## 2020-03-08 MED ORDER — CYANOCOBALAMIN 1000 MCG/ML IJ SOLN
1000.0000 ug | Freq: Once | INTRAMUSCULAR | Status: AC
  Administered 2020-03-08: 1000 ug via INTRAMUSCULAR

## 2020-03-08 NOTE — Progress Notes (Addendum)
Patient presented for B 12 injection to left deltoid, patient voiced no concerns nor showed any signs of distress during injection.  Reviewed.  Dr Scott 

## 2020-03-15 ENCOUNTER — Other Ambulatory Visit: Payer: Self-pay | Admitting: Internal Medicine

## 2020-03-15 ENCOUNTER — Encounter: Payer: Self-pay | Admitting: Internal Medicine

## 2020-03-15 ENCOUNTER — Telehealth: Payer: Self-pay | Admitting: *Deleted

## 2020-03-15 ENCOUNTER — Other Ambulatory Visit (INDEPENDENT_AMBULATORY_CARE_PROVIDER_SITE_OTHER): Payer: PPO

## 2020-03-15 ENCOUNTER — Other Ambulatory Visit: Payer: Self-pay

## 2020-03-15 ENCOUNTER — Ambulatory Visit (INDEPENDENT_AMBULATORY_CARE_PROVIDER_SITE_OTHER): Payer: PPO

## 2020-03-15 DIAGNOSIS — E538 Deficiency of other specified B group vitamins: Secondary | ICD-10-CM

## 2020-03-15 DIAGNOSIS — D72819 Decreased white blood cell count, unspecified: Secondary | ICD-10-CM

## 2020-03-15 LAB — CBC WITH DIFFERENTIAL/PLATELET
Basophils Absolute: 0 10*3/uL (ref 0.0–0.1)
Basophils Relative: 0.5 % (ref 0.0–3.0)
Eosinophils Absolute: 0.1 10*3/uL (ref 0.0–0.7)
Eosinophils Relative: 2 % (ref 0.0–5.0)
HCT: 38.8 % (ref 36.0–46.0)
Hemoglobin: 13 g/dL (ref 12.0–15.0)
Lymphocytes Relative: 30.5 % (ref 12.0–46.0)
Lymphs Abs: 1.1 10*3/uL (ref 0.7–4.0)
MCHC: 33.4 g/dL (ref 30.0–36.0)
MCV: 87 fl (ref 78.0–100.0)
Monocytes Absolute: 0.4 10*3/uL (ref 0.1–1.0)
Monocytes Relative: 10.5 % (ref 3.0–12.0)
Neutro Abs: 2.1 10*3/uL (ref 1.4–7.7)
Neutrophils Relative %: 56.5 % (ref 43.0–77.0)
Platelets: 145 10*3/uL — ABNORMAL LOW (ref 150.0–400.0)
RBC: 4.46 Mil/uL (ref 3.87–5.11)
RDW: 14.2 % (ref 11.5–15.5)
WBC: 3.7 10*3/uL — ABNORMAL LOW (ref 4.0–10.5)

## 2020-03-15 MED ORDER — CYANOCOBALAMIN 1000 MCG/ML IJ SOLN
1000.0000 ug | Freq: Once | INTRAMUSCULAR | Status: AC
  Administered 2020-03-15: 1000 ug via INTRAMUSCULAR

## 2020-03-15 NOTE — Progress Notes (Addendum)
Patient presented for B 12 injection to right deltoid, patient voiced no concerns nor showed any signs of distress during injection.  Reviewed.  Dr Scott 

## 2020-03-15 NOTE — Telephone Encounter (Signed)
Please place future orders for lab appt.   Pt had appt this morning

## 2020-03-15 NOTE — Progress Notes (Signed)
Order placed for f/u lab.   

## 2020-03-15 NOTE — Telephone Encounter (Signed)
Order placed for f/u [facility]bc.  Too early for fasting labs.  Thanks

## 2020-03-15 NOTE — Telephone Encounter (Signed)
Last refill was 8/5 and she sees you on Friday 10/8

## 2020-03-16 MED ORDER — CLONAZEPAM 0.5 MG PO TABS: 0.5000 mg | ORAL_TABLET | Freq: Every day | ORAL | 1 refills | Status: DC | PRN

## 2020-03-16 NOTE — Telephone Encounter (Signed)
rx ok'd for clonazepam #30 with one refill.  PDMP reviewed.

## 2020-03-17 ENCOUNTER — Ambulatory Visit (INDEPENDENT_AMBULATORY_CARE_PROVIDER_SITE_OTHER): Payer: PPO | Admitting: Internal Medicine

## 2020-03-17 ENCOUNTER — Other Ambulatory Visit: Payer: Self-pay

## 2020-03-17 VITALS — BP 124/78 | HR 89 | Temp 98.5°F | Resp 16 | Ht 64.0 in | Wt 157.8 lb

## 2020-03-17 DIAGNOSIS — Z86010 Personal history of colonic polyps: Secondary | ICD-10-CM | POA: Diagnosis not present

## 2020-03-17 DIAGNOSIS — Z23 Encounter for immunization: Secondary | ICD-10-CM | POA: Diagnosis not present

## 2020-03-17 DIAGNOSIS — K219 Gastro-esophageal reflux disease without esophagitis: Secondary | ICD-10-CM

## 2020-03-17 DIAGNOSIS — R000 Tachycardia, unspecified: Secondary | ICD-10-CM

## 2020-03-17 DIAGNOSIS — E7800 Pure hypercholesterolemia, unspecified: Secondary | ICD-10-CM | POA: Diagnosis not present

## 2020-03-17 DIAGNOSIS — R002 Palpitations: Secondary | ICD-10-CM | POA: Diagnosis not present

## 2020-03-17 DIAGNOSIS — R42 Dizziness and giddiness: Secondary | ICD-10-CM

## 2020-03-17 DIAGNOSIS — R739 Hyperglycemia, unspecified: Secondary | ICD-10-CM | POA: Diagnosis not present

## 2020-03-17 DIAGNOSIS — I10 Essential (primary) hypertension: Secondary | ICD-10-CM | POA: Diagnosis not present

## 2020-03-17 DIAGNOSIS — N6099 Unspecified benign mammary dysplasia of unspecified breast: Secondary | ICD-10-CM
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
## 2020-03-17 NOTE — Patient Instructions (Signed)
Melissa Rhodes - 03/22/20 - 11:30 - [facility]

## 2020-03-18 ENCOUNTER — Encounter: Payer: Self-pay | Admitting: Internal Medicine

## 2020-03-18 DIAGNOSIS — R000 Tachycardia, unspecified: Secondary | ICD-10-CM | POA: Insufficient documentation

## 2020-03-18 DIAGNOSIS — R42 Dizziness and giddiness: Secondary | ICD-10-CM | POA: Insufficient documentation

## 2020-03-18 NOTE — Assessment & Plan Note (Signed)
On protonix.  No upper symptoms.

## 2020-03-18 NOTE — Assessment & Plan Note (Signed)
On crestor.  Low cholesterol diet and exercise.  Follow lipid panel and liver function tests.   

## 2020-03-18 NOTE — Assessment & Plan Note (Signed)
Colonoscopy 11/2017 - rectal polyp - tubular adenoma.

## 2020-03-18 NOTE — Assessment & Plan Note (Signed)
Low carb diet and exercise.  Follow met b and a1c.  

## 2020-03-18 NOTE — Assessment & Plan Note (Signed)
Blood pressure doing well on lisinopril and hctz.  Continue current medication regiment.  Follow met b.

## 2020-03-19 NOTE — Assessment & Plan Note (Signed)
Describes the palpitations/increased heart rate, light headedness and feeling like she is going to pass out - as outlined.  EKG - SR with no acute ischemic changes when compared to previous EKG.  Reports history of MVP with MR.  No chest pain.  Tries to stay active.  Has adjusted her diet and lost weight.  Not taking any stimulants.  Discussed the need for further w/up.  Refer to cardiology for further evaluation and question of need for holter monitor, echo, etc.

## 2020-03-19 NOTE — Assessment & Plan Note (Signed)
Followed by oncology.  Last evaluated 01/14/20 - stable.  Recommended f/u in 6 months.  On arimidex.

## 2020-03-22 DIAGNOSIS — I10 Essential (primary) hypertension: Secondary | ICD-10-CM | POA: Diagnosis not present

## 2020-03-22 DIAGNOSIS — I341 Nonrheumatic mitral (valve) prolapse: Secondary | ICD-10-CM | POA: Diagnosis not present

## 2020-03-22 DIAGNOSIS — R0602 Shortness of breath: Secondary | ICD-10-CM | POA: Diagnosis not present

## 2020-03-22 DIAGNOSIS — E7800 Pure hypercholesterolemia, unspecified: Secondary | ICD-10-CM | POA: Diagnosis not present

## 2020-03-22 DIAGNOSIS — R002 Palpitations: Secondary | ICD-10-CM | POA: Diagnosis not present

## 2020-03-23 DIAGNOSIS — R002 Palpitations: Secondary | ICD-10-CM | POA: Diagnosis not present

## 2020-04-10 DIAGNOSIS — R0602 Shortness of breath: Secondary | ICD-10-CM | POA: Diagnosis not present

## 2020-04-10 DIAGNOSIS — R002 Palpitations: Secondary | ICD-10-CM | POA: Diagnosis not present

## 2020-04-10 DIAGNOSIS — I341 Nonrheumatic mitral (valve) prolapse: Secondary | ICD-10-CM | POA: Diagnosis not present

## 2020-04-18 ENCOUNTER — Other Ambulatory Visit: Payer: Self-pay

## 2020-04-18 ENCOUNTER — Ambulatory Visit (INDEPENDENT_AMBULATORY_CARE_PROVIDER_SITE_OTHER): Payer: PPO

## 2020-04-18 DIAGNOSIS — E538 Deficiency of other specified B group vitamins: Secondary | ICD-10-CM | POA: Diagnosis not present

## 2020-04-18 MED ORDER — CYANOCOBALAMIN 1000 MCG/ML IJ SOLN
1000.0000 ug | Freq: Once | INTRAMUSCULAR | Status: AC
  Administered 2020-04-18: 1000 ug via INTRAMUSCULAR

## 2020-04-18 NOTE — Progress Notes (Addendum)
Patient presented for B 12 injection to right deltoid, patient voiced no concerns nor showed any signs of distress during injection.  Reviewed.  Dr Scott 

## 2020-04-20 DIAGNOSIS — I10 Essential (primary) hypertension: Secondary | ICD-10-CM | POA: Diagnosis not present

## 2020-04-20 DIAGNOSIS — E7800 Pure hypercholesterolemia, unspecified: Secondary | ICD-10-CM | POA: Diagnosis not present

## 2020-04-20 DIAGNOSIS — I341 Nonrheumatic mitral (valve) prolapse: Secondary | ICD-10-CM | POA: Diagnosis not present

## 2020-05-03 ENCOUNTER — Other Ambulatory Visit: Payer: Self-pay | Admitting: Internal Medicine

## 2020-05-15 ENCOUNTER — Encounter: Payer: Self-pay | Admitting: Internal Medicine

## 2020-05-15 ENCOUNTER — Ambulatory Visit (INDEPENDENT_AMBULATORY_CARE_PROVIDER_SITE_OTHER): Payer: PPO | Admitting: Internal Medicine

## 2020-05-15 ENCOUNTER — Other Ambulatory Visit: Payer: Self-pay

## 2020-05-15 VITALS — BP 124/74 | HR 76 | Temp 98.3°F | Ht 64.02 in | Wt 158.2 lb

## 2020-05-15 DIAGNOSIS — R002 Palpitations: Secondary | ICD-10-CM

## 2020-05-15 DIAGNOSIS — I10 Essential (primary) hypertension: Secondary | ICD-10-CM | POA: Diagnosis not present

## 2020-05-15 DIAGNOSIS — R739 Hyperglycemia, unspecified: Secondary | ICD-10-CM | POA: Diagnosis not present

## 2020-05-15 DIAGNOSIS — N6099 Unspecified benign mammary dysplasia of unspecified breast: Secondary | ICD-10-CM | POA: Diagnosis not present

## 2020-05-15 DIAGNOSIS — E7800 Pure hypercholesterolemia, unspecified: Secondary | ICD-10-CM | POA: Diagnosis not present

## 2020-05-15 DIAGNOSIS — E538 Deficiency of other specified B group vitamins: Secondary | ICD-10-CM | POA: Diagnosis not present

## 2020-05-15 DIAGNOSIS — K219 Gastro-esophageal reflux disease without esophagitis: Secondary | ICD-10-CM | POA: Diagnosis not present

## 2020-05-15 DIAGNOSIS — Z1159 Encounter for screening for other viral diseases: Secondary | ICD-10-CM

## 2020-05-15 LAB — CBC WITH DIFFERENTIAL/PLATELET
Basophils Absolute: 0 10*3/uL (ref 0.0–0.1)
Basophils Relative: 0.7 % (ref 0.0–3.0)
Eosinophils Absolute: 0.1 10*3/uL (ref 0.0–0.7)
Eosinophils Relative: 1.5 % (ref 0.0–5.0)
HCT: 38.8 % (ref 36.0–46.0)
Hemoglobin: 13.1 g/dL (ref 12.0–15.0)
Lymphocytes Relative: 26.7 % (ref 12.0–46.0)
Lymphs Abs: 0.9 10*3/uL (ref 0.7–4.0)
MCHC: 33.8 g/dL (ref 30.0–36.0)
MCV: 85.5 fl (ref 78.0–100.0)
Monocytes Absolute: 0.3 10*3/uL (ref 0.1–1.0)
Monocytes Relative: 8.8 % (ref 3.0–12.0)
Neutro Abs: 2.2 10*3/uL (ref 1.4–7.7)
Neutrophils Relative %: 62.3 % (ref 43.0–77.0)
Platelets: 161 10*3/uL (ref 150.0–400.0)
RBC: 4.54 Mil/uL (ref 3.87–5.11)
RDW: 14 % (ref 11.5–15.5)
WBC: 3.5 10*3/uL — ABNORMAL LOW (ref 4.0–10.5)

## 2020-05-15 LAB — HEPATIC FUNCTION PANEL
ALT: 15 U/L (ref 0–35)
AST: 16 U/L (ref 0–37)
Albumin: 4.1 g/dL (ref 3.5–5.2)
Alkaline Phosphatase: 72 U/L (ref 39–117)
Bilirubin, Direct: 0.1 mg/dL (ref 0.0–0.3)
Total Bilirubin: 0.5 mg/dL (ref 0.2–1.2)
Total Protein: 5.9 g/dL — ABNORMAL LOW (ref 6.0–8.3)

## 2020-05-15 LAB — LIPID PANEL
Cholesterol: 151 mg/dL (ref 0–200)
HDL: 64.5 mg/dL (ref >39.00)
LDL Cholesterol: 76 mg/dL (ref 0–99)
NonHDL: 86.83
Total CHOL/HDL Ratio: 2
Triglycerides: 53 mg/dL (ref 0.0–149.0)
VLDL: 10.6 mg/dL (ref 0.0–40.0)

## 2020-05-15 LAB — BASIC METABOLIC PANEL
BUN: 17 mg/dL (ref 6–23)
CO2: 29 mEq/L (ref 19–32)
Calcium: 9.2 mg/dL (ref 8.4–10.5)
Chloride: 105 mEq/L (ref 96–112)
Creatinine, Ser: 0.66 mg/dL (ref 0.40–1.20)
GFR: 89.21 mL/min (ref >60.00)
Glucose, Bld: 88 mg/dL (ref 70–99)
Potassium: 4.3 mEq/L (ref 3.5–5.1)
Sodium: 143 mEq/L (ref 135–145)

## 2020-05-15 LAB — HEMOGLOBIN A1C: Hgb A1c MFr Bld: 5.7 % (ref 4.6–6.5)

## 2020-05-15 MED ORDER — CYANOCOBALAMIN 1000 MCG/ML IJ SOLN
1000.0000 ug | Freq: Once | INTRAMUSCULAR | Status: AC
  Administered 2020-05-15: 1000 ug via INTRAMUSCULAR

## 2020-05-15 MED ORDER — CLONAZEPAM 0.5 MG PO TABS: 0.5000 mg | ORAL_TABLET | Freq: Every day | ORAL | 1 refills | Status: DC | PRN
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
## 2020-05-16 LAB — HEPATITIS C ANTIBODY
Hepatitis C Ab: NONREACTIVE
SIGNAL TO CUT-OFF: 0.01 (ref <1.00)

## 2020-05-21 ENCOUNTER — Encounter: Payer: Self-pay | Admitting: Internal Medicine

## 2020-05-21 DIAGNOSIS — R002 Palpitations: Secondary | ICD-10-CM | POA: Insufficient documentation

## 2020-05-21 NOTE — Assessment & Plan Note (Signed)
Cardiac w/up as outlined.  On magnesium.  Currently without symptoms.  Follow.

## 2020-05-21 NOTE — Assessment & Plan Note (Signed)
Blood pressure doing well on lisinopril and hctz.  Follow pressures.  Follow metabolic panel.

## 2020-05-21 NOTE — Assessment & Plan Note (Signed)
No upper symptoms reported.  On protonix.   

## 2020-05-21 NOTE — Assessment & Plan Note (Signed)
On crestor.  Low cholesterol diet and exercise.  Follow lipid panel and liver function tests.   

## 2020-05-21 NOTE — Assessment & Plan Note (Signed)
Followed by oncology.  Last evaluated 01/2020. On arimidex.  Stable.  Recommended f/u in 6 months.

## 2020-05-21 NOTE — Assessment & Plan Note (Signed)
Low carb diet and exercise.  Follow met b and a1c.   

## 2020-06-12 ENCOUNTER — Telehealth: Payer: Self-pay | Admitting: Internal Medicine

## 2020-06-12 NOTE — Telephone Encounter (Signed)
Left message for patient to call back and schedule Medicare Annual Wellness Visit (AWV)  ° °This should be a telephone visit only=30 minutes. ° °No hx of AWV; please schedule at anytime with Denisa O'Brien-Blaney at [facility] ° ° °

## 2020-06-20 ENCOUNTER — Other Ambulatory Visit: Payer: Self-pay

## 2020-06-20 ENCOUNTER — Ambulatory Visit (INDEPENDENT_AMBULATORY_CARE_PROVIDER_SITE_OTHER): Payer: PPO

## 2020-06-20 DIAGNOSIS — E538 Deficiency of other specified B group vitamins: Secondary | ICD-10-CM | POA: Diagnosis not present

## 2020-06-20 MED ORDER — CYANOCOBALAMIN 1000 MCG/ML IJ SOLN
1000.0000 ug | Freq: Once | INTRAMUSCULAR | Status: AC
  Administered 2020-06-20: 1000 ug via INTRAMUSCULAR

## 2020-06-20 NOTE — Progress Notes (Signed)
Patient presented for B 12 injection to Right deltoid, patient voiced no concerns nor showed any signs of distress during injection   

## 2020-06-21 ENCOUNTER — Ambulatory Visit (INDEPENDENT_AMBULATORY_CARE_PROVIDER_SITE_OTHER): Payer: PPO

## 2020-06-21 VITALS — Ht 64.02 in | Wt 158.0 lb

## 2020-06-21 DIAGNOSIS — Z0000 Encounter for general adult medical examination without abnormal findings: Secondary | ICD-10-CM

## 2020-06-21 NOTE — Progress Notes (Addendum)
Subjective:   Melissa Rhodes is a 70 y.o. female who presents for an Initial Medicare Annual Wellness Visit.  Review of Systems    No ROS.  Medicare Wellness Virtual Visit.   Cardiac Risk Factors include: advanced age (>41men, >24 women);hypertension     Objective:    Today's Vitals   06/21/20 1304  Weight: 158 lb (71.7 kg)  Height: 5' 4.02" (1.626 m)   Body mass index is 27.1 kg/m.  Advanced Directives 06/21/2020 11/22/2017 11/19/2017  Does Patient Have a Medical Advance Directive? No No No  Would patient like information on creating a medical advance directive? No - Patient declined No - Patient declined -    Current Medications (verified) Outpatient Encounter Medications as of 06/21/2020  Medication Sig  . anastrozole (ARIMIDEX) 1 MG tablet Take 1 mg by mouth daily.   Marland Kitchen aspirin 81 MG tablet Take 81 mg by mouth daily.   . cholecalciferol (VITAMIN D3) 25 MCG (1000 UNIT) tablet Take 1,000 Units by mouth daily.  . clonazePAM (KLONOPIN) 0.5 MG tablet Take 1 tablet (0.5 mg total) by mouth daily as needed for anxiety.  . fluocinonide cream (LIDEX) 0.05 % Use as directed  . hydrochlorothiazide (HYDRODIURIL) 25 MG tablet TAKE 1 TABLET(25 MG) BY MOUTH DAILY  . lisinopril (ZESTRIL) 10 MG tablet TAKE 1 TABLET(10 MG) BY MOUTH DAILY  . Magnesium 250 MG TABS   . magnesium oxide (MAG-OX) 400 MG tablet Take by mouth.  . pantoprazole (PROTONIX) 40 MG tablet TAKE 1 TABLET(40 MG) BY MOUTH DAILY  . potassium chloride (MICRO-K) 10 MEQ CR capsule TAKE 2 CAPSULES(20 MEQ) BY MOUTH DAILY  . rOPINIRole (REQUIP) 1 MG tablet TAKE 1 TABLET(1 MG) BY MOUTH AT BEDTIME  . rosuvastatin (CRESTOR) 20 MG tablet TAKE 1 TABLET(20 MG) BY MOUTH DAILY   No facility-administered encounter medications on file as of 06/21/2020.    Allergies (verified) Patient has no known allergies.   History: Past Medical History:  Diagnosis Date  . Arthritis 2009  . 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  Cancer [facility]) 2005   colon  . GERD (gastroesophageal reflux disease)   . Hypertension 2010  . Mammographic microcalcification 2013  . Melanoma ([place]) 2019   left arm  . Mitral valve prolapse 2006  . 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 Obesity, unspecified 2014  . Personal history of colonic polyps 2013  . Renal disorder    kidney stones  . Special screening for malignant neoplasms, colon 2014  . Tubal pregnancy    Past Surgical History:  Procedure Laterality Date  . BREAST BIOPSY Left 2013  . BREAST BIOPSY Right 03/06/2017   LIQ coil clip. FEATURES OF RADIAL SCAR  . BREAST SURGERY Left 2013   re excision of left breast showed small area of residual ADH, but she was not upsatged to DCIS or invasive cancer. The margins were clear.  Patient declined tamoxifen therapy  . COLONOSCOPY  [id number]  . COLONOSCOPY W/ POLYPECTOMY  2014  . COLONOSCOPY WITH PROPOFOL N/A 11/19/2017   Procedure: COLONOSCOPY WITH PROPOFOL;  Surgeon: Robert Bellow, MD;  Location: [facility] ENDOSCOPY;  Service: Endoscopy;  Laterality: N/A;  . ECTOPIC PREGNANCY SURGERY  1981  . ESOPHAGOGASTRODUODENOSCOPY (EGD) WITH PROPOFOL N/A 11/19/2017   Procedure: ESOPHAGOGASTRODUODENOSCOPY (EGD) WITH PROPOFOL;  Surgeon: Robert Bellow, MD;  Location: [facility] ENDOSCOPY;  Service: Endoscopy;  Laterality: N/A;  . kidney stones  2009  . SHOULDER SURGERY Right  2007  . TUBAL LIGATION  1981   Family History  Problem Relation Age of Onset  . Cancer Father 8       lung  . Early death Father   . Arthritis Mother   . Depression Mother   . Diabetes Mother   . Heart disease Mother   . Hypertension Mother   . Hyperlipidemia Mother   . Stroke Mother   . Depression Sister   . Diabetes Sister   . Hyperlipidemia Sister   . Hypertension Sister   . Depression Sister   . Hyperlipidemia Sister   . Hypertension Sister   . Miscarriages / Stillbirths Sister   . Depression Sister   . Hyperlipidemia Sister   .  Hypertension Sister   . Depression Brother   . Diabetes Brother   . Hyperlipidemia Brother   . Hypertension Brother   . Stroke Brother   . Breast cancer Neg Hx   . Ovarian cancer Neg Hx    Social History   Socioeconomic History  . Marital status: Married    Spouse name: Not on file  . Number of children: Not on file  . Years of education: Not on file  . Highest education level: Not on file  Occupational History  . Not on file  Tobacco Use  . Smoking status: Never Smoker  . Smokeless tobacco: Never Used  Vaping Use  . Vaping Use: Never used  Substance and Sexual Activity  . Alcohol use: No  . Drug use: No  . Sexual activity: Never  Other Topics Concern  . Not on file  Social History Narrative  . Not on file   Social Determinants of Health   Financial Resource Strain: Low Risk   . Difficulty of Paying Living Expenses: Not hard at all  Food Insecurity: No Food Insecurity  . Worried About Charity fundraiser in the Last Year: Never true  . Ran Out of Food in the Last Year: Never true  Transportation Needs: No Transportation Needs  . Lack of Transportation (Medical): No  . Lack of Transportation (Non-Medical): No  Physical Activity: Not on file  Stress: No Stress Concern Present  . Feeling of Stress : Not at all  Social Connections: Unknown  . Frequency of Communication with Friends and Family: Not on file  . Frequency of Social Gatherings with Friends and Family: Not on file  . Attends Religious Services: Not on file  . Active Member of Clubs or Organizations: Not on file  . Attends Archivist Meetings: Not on file  . Marital Status: Married    Tobacco Counseling Counseling given: Not Answered   Clinical Intake:  Pre-visit preparation completed: Yes        Diabetes: No  How often do you need to have someone help you when you read instructions, pamphlets, or other written materials from your doctor or pharmacy?: 1 - Never   Interpreter  Needed?: No    Activities of Daily Living In your present state of health, do you have any difficulty performing the following activities: 06/21/2020  Hearing? N  Vision? N  Difficulty concentrating or making decisions? N  Walking or climbing stairs? N  Dressing or bathing? N  Doing errands, shopping? N  Preparing Food and eating ? N  Using the Toilet? N  In the past six months, have you accidently leaked urine? N  Do you have problems with loss of bowel control? N  Managing your Medications? N  Managing your  Finances? N  Housekeeping or managing your Housekeeping? N  Some recent data might be hidden    Patient Care Team: Einar Pheasant, MD as PCP - General (Internal Medicine) Bary Castilla Forest Gleason, MD as Consulting Physician (General Surgery) Madelyn Brunner, MD (Internal Medicine)  Indicate any recent Medical Services you may have received from other than [facility] providers in the past year (date may be approximate).     Assessment:   This is a routine wellness examination for [place].  I connected with Tinzley today by telephone and verified that I am speaking with the correct person using two identifiers. Location patient: home Location provider: work Persons participating in the virtual visit: patient, Marine scientist.    I discussed the limitations, risks, security and privacy concerns of performing an evaluation and management service by telephone and the availability of in person appointments. The patient expressed understanding and verbally consented to this telephonic visit.    Interactive audio and video telecommunications were attempted between this provider and patient, however failed, due to patient having technical difficulties OR patient did not have access to video capability.  We continued and completed visit with audio only.  Some vital signs may be absent or patient reported.   Hearing/Vision screen  Hearing Screening   125Hz  250Hz  500Hz  1000Hz  2000Hz  3000Hz  4000Hz   6000Hz  8000Hz   Right ear:           Left ear:           Comments: Patient is able to hear conversational tones without difficulty.  No issues reported.  Followed by Visits every 4 months  Hearing aid, bilateral   Vision Screening Comments: Followed by  Wears corrective lenses Cataract extraction, bilateral Visual acuity not assessed, virtual visit.  They have seen their ophthalmologist in the last 12 months.   Dietary issues and exercise activities discussed: Current Exercise Habits: Home exercise routine, Intensity: Mild  Regular diet Good water intake  Goals      Patient Stated   .  Weight loss goal of 10lbs (pt-stated)      Portion control Monitor carb intake      Depression Screen PHQ 2/9 Scores 06/21/2020 05/15/2020 03/17/2020 02/23/2019 07/09/2017  PHQ - 2 Score 0 0 0 0 0  PHQ- 9 Score - - - - 3    Fall Risk Fall Risk  06/21/2020 05/15/2020 03/17/2020 02/23/2019  Falls in the past year? 0 0 0 0  Number falls in past yr: 0 0 - 0  Injury with Fall? 0 0 - 0  Follow up Falls evaluation completed Falls evaluation completed Falls evaluation completed Falls evaluation completed    FALL RISK PREVENTION PERTAINING TO THE HOME: Handrails in use when climbing stairs? Yes Home free of loose throw rugs in walkways, pet beds, electrical cords, etc? Yes  Adequate lighting in your home to reduce risk of falls? Yes   ASSISTIVE DEVICES UTILIZED TO PREVENT FALLS: Use of a cane, walker or w/c? No    TIMED UP AND GO: Was the test performed? No . Virtual visit.  Cognitive Function:  Patient is alert and oriented x3.  Denies difficulty focusing, making decisions, memory loss.  MMSE/6CIT deferred. Normal by direct communication/observation.      Immunizations Immunization History  Administered Date(s) Administered  . Fluad Quad(high Dose 65+) 02/26/2019, 03/17/2020  . Influenza, High Dose Seasonal PF 02/23/2018  . Influenza,inj,Quad PF,6+ Mos 03/25/2017  . Pneumococcal  Conjugate-13 01/06/2017  . Pneumococcal Polysaccharide-23 09/08/2015  . Tdap 10/17/2011  Health Maintenance Health Maintenance  Topic Date Due  . MAMMOGRAM  07/15/2020  . TETANUS/TDAP  10/16/2021  . COLONOSCOPY (Pts 45-73yrs Insurance coverage will need to be confirmed)  11/20/2027  . INFLUENZA VACCINE  Completed  . DEXA SCAN  Completed  . Hepatitis C Screening  Completed  . PNA vac Low Risk Adult  Completed  . COVID-19 Vaccine  Discontinued   Colorectal cancer screening: Type of screening: Colonoscopy. Completed 11/19/17. Repeat every 10 years.  Mammogram status: Completed 07/16/19. Repeat every year  Bone Density status: Completed 07/19/08. Results reflect: Bone density results: NORMAL. Repeat every 5 years.  Lung Cancer Screening: (Low Dose CT Chest recommended if Age 58-80 years, 30 pack-year currently smoking OR have quit w/in 15years.) does not qualify.    Hepatitis C Screening: Completed 05/15/20.   Vision Screening: Recommended annual ophthalmology exams for early detection of glaucoma and other disorders of the eye. Is the patient up to date with their annual eye exam?  Yes   Dental Screening: Recommended annual dental exams for proper oral hygiene.  Community Resource Referral / Chronic Care Management: CRR required this visit?  No   CCM required this visit?  No      Plan:   Keep all routine maintenance appointments.   Nurse visit 07/22/19 @ 11:15, B12  Cpe 08/19/19 @ 9:30  I have personally reviewed and noted the following in the patient's chart:   . Medical and social history . Use of alcohol, tobacco or illicit drugs  . Current medications and supplements . Functional ability and status . Nutritional status . Physical activity . Advanced directives . List of other physicians . Hospitalizations, surgeries, and ER visits in previous 12 months . Vitals . Screenings to include cognitive, depression, and falls . Referrals and appointments  In addition, I  have reviewed and discussed with patient certain preventive protocols, quality metrics, and best practice recommendations. A written personalized care plan for preventive services as well as general preventive health recommendations were provided to patient via mychart.     Varney Biles, LPN   [id number]   Agree Dr. Olivia Mackie McLean-Scocuzza

## 2020-06-21 NOTE — Patient Instructions (Addendum)
Melissa Rhodes , Thank you for taking time to come for your Medicare Wellness Visit. I appreciate your ongoing commitment to your health goals. Please review the following plan we discussed and let me know if I can assist you in the future.   These are the goals we discussed: Goals      Patient Stated   .  Weight loss goal of 10lbs (pt-stated)      Portion control Monitor carb intake       This is a list of the screening recommended for you and due dates:  Health Maintenance  Topic Date Due  . Mammogram  07/15/2020  . Tetanus Vaccine  10/16/2021  . Colon Cancer Screening  11/20/2027  . Flu Shot  Completed  . DEXA scan (bone density measurement)  Completed  .  Hepatitis C: One time screening is recommended by Center for Disease Control  (CDC) for  adults born from [birth year] through 1965.   Completed  . Pneumonia vaccines  Completed  . COVID-19 Vaccine  Discontinued    Immunizations Immunization History  Administered Date(s) Administered  . Fluad Quad(high Dose 65+) 02/26/2019, 03/17/2020  . Influenza, High Dose Seasonal PF 02/23/2018  . Influenza,inj,Quad PF,6+ Mos 03/25/2017  . Pneumococcal Conjugate-13 01/06/2017  . Pneumococcal Polysaccharide-23 09/08/2015  . Tdap 10/17/2011   Keep all routine maintenance appointments.   Nurse visit 07/22/19 @ 11:15, B12  Cpe 08/19/19 @ 9:30  Advanced directives: not yet completed.   Conditions/risks identified: none new.   Follow up in one year for your annual wellness visit.   Preventive Care 37 Years and Older, Female Preventive care refers to lifestyle choices and visits with your health care provider that can promote health and wellness. What does preventive care include?  A yearly physical exam. This is also called an annual well check.  Dental exams once or twice a year.  Routine eye exams. Ask your health care provider how often you should have your eyes checked.  Personal lifestyle choices, including:  Daily care of  your teeth and gums.  Regular physical activity.  Eating a healthy diet.  Avoiding tobacco and drug use.  Limiting alcohol use.  Practicing safe sex.  Taking low-dose aspirin every day.  Taking vitamin and mineral supplements as recommended by your health care provider. What happens during an annual well check? The services and screenings done by your health care provider during your annual well check will depend on your age, overall health, lifestyle risk factors, and family history of disease. Counseling  Your health care provider may ask you questions about your:  Alcohol use.  Tobacco use.  Drug use.  Emotional well-being.  Home and relationship well-being.  Sexual activity.  Eating habits.  History of falls.  Memory and ability to understand (cognition).  Work and work Statistician.  Reproductive health. Screening  You may have the following tests or measurements:  Height, weight, and BMI.  Blood pressure.  Lipid and cholesterol levels. These may be checked every 5 years, or more frequently if you are over 11 years old.  Skin check.  Lung cancer screening. You may have this screening every year starting at age 70 if you have a 30-pack-year history of smoking and currently smoke or have quit within the past 15 years.  Fecal occult blood test (FOBT) of the stool. You may have this test every year starting at age 1.  Flexible sigmoidoscopy or colonoscopy. You may have a sigmoidoscopy every 5 years or a colonoscopy every  10 years starting at age 105.  Hepatitis C blood test.  Hepatitis B blood test.  Sexually transmitted disease (STD) testing.  Diabetes screening. This is done by checking your blood sugar (glucose) after you have not eaten for a while (fasting). You may have this done every 1-3 years.  Bone density scan. This is done to screen for osteoporosis. You may have this done starting at age 56.  Mammogram. This may be done every 1-2 years.  Talk to your health care provider about how often you should have regular mammograms. Talk with your health care provider about your test results, treatment options, and if necessary, the need for more tests. Vaccines  Your health care provider may recommend certain vaccines, such as:  Influenza vaccine. This is recommended every year.  Tetanus, diphtheria, and acellular pertussis (Tdap, Td) vaccine. You may need a Td booster every 10 years.  Zoster vaccine. You may need this after age 54.  Pneumococcal 13-valent conjugate (PCV13) vaccine. One dose is recommended after age 27.  Pneumococcal polysaccharide (PPSV23) vaccine. One dose is recommended after age 71. Talk to your health care provider about which screenings and vaccines you need and how often you need them. This information is not intended to replace advice given to you by your health care provider. Make sure you discuss any questions you have with your health care provider. Document Released: 06/23/2015 Document Revised: 02/14/2016 Document Reviewed: 03/28/2015 Elsevier Interactive Patient Education  2017 [place] Prevention in the Home Falls can cause injuries. They can happen to people of all ages. There are many things you can do to make your home safe and to help prevent falls. What can I do on the outside of my home?  Regularly fix the edges of walkways and driveways and fix any cracks.  Remove anything that might make you trip as you walk through a door, such as a raised step or threshold.  Trim any bushes or trees on the path to your home.  Use bright outdoor lighting.  Clear any walking paths of anything that might make someone trip, such as rocks or tools.  Regularly check to see if handrails are loose or broken. Make sure that both sides of any steps have handrails.  Any raised decks and porches should have guardrails on the edges.  Have any leaves, snow, or ice cleared regularly.  Use sand or  salt on walking paths during winter.  Clean up any spills in your garage right away. This includes oil or grease spills. What can I do in the bathroom?  Use night lights.  Install grab bars by the toilet and in the tub and shower. Do not use towel bars as grab bars.  Use non-skid mats or decals in the tub or shower.  If you need to sit down in the shower, use a plastic, non-slip stool.  Keep the floor dry. Clean up any water that spills on the floor as soon as it happens.  Remove soap buildup in the tub or shower regularly.  Attach bath mats securely with double-sided non-slip rug tape.  Do not have throw rugs and other things on the floor that can make you trip. What can I do in the bedroom?  Use night lights.  Make sure that you have a light by your bed that is easy to reach.  Do not use any sheets or blankets that are too big for your bed. They should not hang down onto the floor.  Have a firm chair that has side arms. You can use this for support while you get dressed.  Do not have throw rugs and other things on the floor that can make you trip. What can I do in the kitchen?  Clean up any spills right away.  Avoid walking on wet floors.  Keep items that you use a lot in easy-to-reach places.  If you need to reach something above you, use a strong step stool that has a grab bar.  Keep electrical cords out of the way.  Do not use floor polish or wax that makes floors slippery. If you must use wax, use non-skid floor wax.  Do not have throw rugs and other things on the floor that can make you trip. What can I do with my stairs?  Do not leave any items on the stairs.  Make sure that there are handrails on both sides of the stairs and use them. Fix handrails that are broken or loose. Make sure that handrails are as long as the stairways.  Check any carpeting to make sure that it is firmly attached to the stairs. Fix any carpet that is loose or worn.  Avoid having  throw rugs at the top or bottom of the stairs. If you do have throw rugs, attach them to the floor with carpet tape.  Make sure that you have a light switch at the top of the stairs and the bottom of the stairs. If you do not have them, ask someone to add them for you. What else can I do to help prevent falls?  Wear shoes that:  Do not have high heels.  Have rubber bottoms.  Are comfortable and fit you well.  Are closed at the toe. Do not wear sandals.  If you use a stepladder:  Make sure that it is fully opened. Do not climb a closed stepladder.  Make sure that both sides of the stepladder are locked into place.  Ask someone to hold it for you, if possible.  Clearly mark and make sure that you can see:  Any grab bars or handrails.  First and last steps.  Where the edge of each step is.  Use tools that help you move around (mobility aids) if they are needed. These include:  Canes.  Walkers.  Scooters.  Crutches.  Turn on the lights when you go into a dark area. Replace any light bulbs as soon as they burn out.  Set up your furniture so you have a clear path. Avoid moving your furniture around.  If any of your floors are uneven, fix them.  If there are any pets around you, be aware of where they are.  Review your medicines with your doctor. Some medicines can make you feel dizzy. This can increase your chance of falling. Ask your doctor what other things that you can do to help prevent falls. This information is not intended to replace advice given to you by your health care provider. Make sure you discuss any questions you have with your health care provider. Document Released: 03/23/2009 Document Revised: 11/02/2015 Document Reviewed: 07/01/2014 Elsevier Interactive Patient Education  2017 [employer].

## 2020-07-14 ENCOUNTER — Encounter: Payer: Self-pay | Admitting: Internal Medicine

## 2020-07-14 MED ORDER — CLONAZEPAM 0.5 MG PO TABS: 0.5000 mg | ORAL_TABLET | Freq: Every day | ORAL | 1 refills | Status: DC | PRN

## 2020-07-14 NOTE — Telephone Encounter (Signed)
rx sent in for clonazepam #30 with one refill.  

## 2020-07-17 DIAGNOSIS — K219 Gastro-esophageal reflux disease without esophagitis: Secondary | ICD-10-CM | POA: Diagnosis not present

## 2020-07-17 DIAGNOSIS — M1990 Unspecified osteoarthritis, unspecified site: Secondary | ICD-10-CM | POA: Diagnosis not present

## 2020-07-17 DIAGNOSIS — Z85038 Personal history of other malignant neoplasm of large intestine: Secondary | ICD-10-CM | POA: Diagnosis not present

## 2020-07-17 DIAGNOSIS — I10 Essential (primary) hypertension: Secondary | ICD-10-CM | POA: Diagnosis not present

## 2020-07-17 DIAGNOSIS — N6099 Unspecified benign mammary dysplasia of unspecified breast: Secondary | ICD-10-CM | POA: Diagnosis not present

## 2020-07-17 DIAGNOSIS — Z87442 Personal history of urinary calculi: Secondary | ICD-10-CM | POA: Diagnosis not present

## 2020-07-17 DIAGNOSIS — Z1231 Encounter for screening mammogram for malignant neoplasm of breast: Secondary | ICD-10-CM | POA: Diagnosis not present

## 2020-07-17 DIAGNOSIS — N6091 Unspecified benign mammary dysplasia of right breast: Secondary | ICD-10-CM | POA: Diagnosis not present

## 2020-07-17 LAB — HM MAMMOGRAPHY

## 2020-07-21 ENCOUNTER — Ambulatory Visit: Payer: PPO

## 2020-07-25 ENCOUNTER — Other Ambulatory Visit: Payer: Self-pay

## 2020-07-25 ENCOUNTER — Ambulatory Visit (INDEPENDENT_AMBULATORY_CARE_PROVIDER_SITE_OTHER): Payer: PPO

## 2020-07-25 DIAGNOSIS — E538 Deficiency of other specified B group vitamins: Secondary | ICD-10-CM | POA: Diagnosis not present

## 2020-07-25 MED ORDER — CYANOCOBALAMIN 1000 MCG/ML IJ SOLN
1000.0000 ug | Freq: Once | INTRAMUSCULAR | Status: AC
  Administered 2020-07-25: 1000 ug via INTRAMUSCULAR

## 2020-07-25 NOTE — Progress Notes (Signed)
Patient presented for B 12 injection to right deltoid, patient voiced no concerns nor showed any signs of distress during injection. 

## 2020-08-01 ENCOUNTER — Other Ambulatory Visit: Payer: Self-pay | Admitting: Internal Medicine

## 2020-08-18 ENCOUNTER — Encounter: Payer: PPO | Admitting: Internal Medicine

## 2020-08-22 ENCOUNTER — Other Ambulatory Visit: Payer: Self-pay

## 2020-08-22 ENCOUNTER — Ambulatory Visit (INDEPENDENT_AMBULATORY_CARE_PROVIDER_SITE_OTHER): Payer: PPO | Admitting: *Deleted

## 2020-08-22 DIAGNOSIS — E538 Deficiency of other specified B group vitamins: Secondary | ICD-10-CM

## 2020-08-22 MED ORDER — CYANOCOBALAMIN 1000 MCG/ML IJ SOLN
1000.0000 ug | Freq: Once | INTRAMUSCULAR | Status: AC
  Administered 2020-08-22: 1000 ug via INTRAMUSCULAR

## 2020-08-22 NOTE — Progress Notes (Signed)
Patient presented for B 12 injection to right deltoid, patient voiced no concerns nor showed any signs of distress during injection. 

## 2020-09-14 ENCOUNTER — Other Ambulatory Visit: Payer: Self-pay | Admitting: Internal Medicine

## 2020-09-14 NOTE — Telephone Encounter (Signed)
rx ok'd for clonazepam #30 with one refill.   

## 2020-09-26 ENCOUNTER — Ambulatory Visit: Payer: PPO

## 2020-10-03 ENCOUNTER — Ambulatory Visit (INDEPENDENT_AMBULATORY_CARE_PROVIDER_SITE_OTHER): Payer: PPO

## 2020-10-03 ENCOUNTER — Other Ambulatory Visit: Payer: Self-pay

## 2020-10-03 DIAGNOSIS — E538 Deficiency of other specified B group vitamins: Secondary | ICD-10-CM | POA: Diagnosis not present

## 2020-10-03 MED ORDER — CYANOCOBALAMIN 1000 MCG/ML IJ SOLN
1000.0000 ug | Freq: Once | INTRAMUSCULAR | Status: AC
  Administered 2020-10-03: 1000 ug via INTRAMUSCULAR

## 2020-10-03 NOTE — Progress Notes (Signed)
Patient presented for B 12 injection to right deltoid, patient voiced no concerns nor showed any signs of distress during injection. 

## 2020-10-09 DIAGNOSIS — D2261 Melanocytic nevi of right upper limb, including shoulder: Secondary | ICD-10-CM | POA: Diagnosis not present

## 2020-10-09 DIAGNOSIS — Z09 Encounter for follow-up examination after completed treatment for conditions other than malignant neoplasm: Secondary | ICD-10-CM | POA: Diagnosis not present

## 2020-10-09 DIAGNOSIS — D2262 Melanocytic nevi of left upper limb, including shoulder: Secondary | ICD-10-CM | POA: Diagnosis not present

## 2020-10-09 DIAGNOSIS — D2271 Melanocytic nevi of right lower limb, including hip: Secondary | ICD-10-CM | POA: Diagnosis not present

## 2020-10-09 DIAGNOSIS — D225 Melanocytic nevi of trunk: Secondary | ICD-10-CM | POA: Diagnosis not present

## 2020-10-09 DIAGNOSIS — Z85820 Personal history of malignant melanoma of skin: Secondary | ICD-10-CM | POA: Diagnosis not present

## 2020-10-26 ENCOUNTER — Encounter: Payer: Self-pay | Admitting: Internal Medicine

## 2020-10-26 ENCOUNTER — Other Ambulatory Visit: Payer: Self-pay | Admitting: Internal Medicine

## 2020-10-26 DIAGNOSIS — M25551 Pain in right hip: Secondary | ICD-10-CM

## 2020-10-27 NOTE — Telephone Encounter (Signed)
Patient would like to be referred to [place] and then keep f/u with you

## 2020-10-27 NOTE — Telephone Encounter (Signed)
Please call her.  I do not mind working her in, but if she is having persistent increased hip pain, I do not remind referring her to ortho [facility].  You can see what she prefers, but I do not mind referring and then we can keep our f/u for her other issues.

## 2020-10-28 NOTE — Telephone Encounter (Signed)
Order placed for ortho referral.   

## 2020-11-03 ENCOUNTER — Ambulatory Visit: Payer: PPO

## 2020-11-04 ENCOUNTER — Other Ambulatory Visit: Payer: Self-pay | Admitting: Internal Medicine

## 2020-11-06 ENCOUNTER — Other Ambulatory Visit: Payer: Self-pay | Admitting: Internal Medicine

## 2020-11-07 ENCOUNTER — Other Ambulatory Visit: Payer: Self-pay

## 2020-11-07 ENCOUNTER — Ambulatory Visit (INDEPENDENT_AMBULATORY_CARE_PROVIDER_SITE_OTHER): Payer: PPO

## 2020-11-07 DIAGNOSIS — E538 Deficiency of other specified B group vitamins: Secondary | ICD-10-CM

## 2020-11-07 MED ORDER — CYANOCOBALAMIN 1000 MCG/ML IJ SOLN
1000.0000 ug | Freq: Once | INTRAMUSCULAR | Status: AC
  Administered 2020-11-07: 1000 ug via INTRAMUSCULAR

## 2020-11-07 NOTE — Progress Notes (Signed)
Patient presented for B 12 injection to right deltoid, patient voiced no concerns nor showed any signs of distress during injection. 

## 2020-11-14 ENCOUNTER — Encounter: Payer: Self-pay | Admitting: Internal Medicine

## 2020-11-14 ENCOUNTER — Encounter: Payer: PPO | Admitting: Internal Medicine

## 2020-11-14 MED ORDER — CLONAZEPAM 0.5 MG PO TABS: ORAL_TABLET | ORAL | 0 refills | Status: DC

## 2020-11-14 NOTE — Telephone Encounter (Signed)
LMTCB

## 2020-11-14 NOTE — Telephone Encounter (Signed)
I sent a refill to the pharmacy. Controlled substance database reviewed.

## 2020-11-14 NOTE — Telephone Encounter (Signed)
Refill request for clonazepam Last OV 05/15/20 Next OV 01/09/2021 Last refill 09/14/20  Spoke with pt to let her know that Dr Nicki Reaper is out of office. She has 1 pill left.

## 2020-11-23 ENCOUNTER — Other Ambulatory Visit: Payer: Self-pay | Admitting: Internal Medicine

## 2020-11-27 ENCOUNTER — Telehealth: Payer: Self-pay | Admitting: Internal Medicine

## 2020-11-27 NOTE — Telephone Encounter (Signed)
Rejection Reason - Other - Pt cancelled twice" Grayland Jack said on Nov 27, 2020 2:44 PM  Msg sent from [facility] orthopedic surgery

## 2020-12-08 ENCOUNTER — Ambulatory Visit (INDEPENDENT_AMBULATORY_CARE_PROVIDER_SITE_OTHER): Payer: PPO

## 2020-12-08 ENCOUNTER — Other Ambulatory Visit: Payer: Self-pay

## 2020-12-08 DIAGNOSIS — E538 Deficiency of other specified B group vitamins: Secondary | ICD-10-CM | POA: Diagnosis not present

## 2020-12-08 MED ORDER — CYANOCOBALAMIN 1000 MCG/ML IJ SOLN
1000.0000 ug | Freq: Once | INTRAMUSCULAR | Status: AC
  Administered 2020-12-08: 1000 ug via INTRAMUSCULAR

## 2020-12-08 NOTE — Progress Notes (Signed)
Patient presented for B 12 injection to right deltoid, patient voiced no concerns nor showed any signs of distress during injection. 

## 2020-12-13 ENCOUNTER — Other Ambulatory Visit: Payer: Self-pay | Admitting: Family Medicine

## 2020-12-14 MED ORDER — CLONAZEPAM 0.5 MG PO TABS: ORAL_TABLET | ORAL | 0 refills | Status: DC

## 2021-01-09 ENCOUNTER — Other Ambulatory Visit: Payer: Self-pay

## 2021-01-09 ENCOUNTER — Ambulatory Visit (INDEPENDENT_AMBULATORY_CARE_PROVIDER_SITE_OTHER): Payer: PPO | Admitting: Internal Medicine

## 2021-01-09 ENCOUNTER — Ambulatory Visit: Payer: PPO

## 2021-01-09 ENCOUNTER — Encounter: Payer: Self-pay | Admitting: Internal Medicine

## 2021-01-09 VITALS — BP 130/80 | HR 55 | Temp 98.5°F | Ht 64.02 in | Wt 162.8 lb

## 2021-01-09 DIAGNOSIS — Z85820 Personal history of malignant melanoma of skin: Secondary | ICD-10-CM | POA: Diagnosis not present

## 2021-01-09 DIAGNOSIS — K219 Gastro-esophageal reflux disease without esophagitis: Secondary | ICD-10-CM

## 2021-01-09 DIAGNOSIS — E7800 Pure hypercholesterolemia, unspecified: Secondary | ICD-10-CM

## 2021-01-09 DIAGNOSIS — R739 Hyperglycemia, unspecified: Secondary | ICD-10-CM

## 2021-01-09 DIAGNOSIS — N6099 Unspecified benign mammary dysplasia of unspecified breast: Secondary | ICD-10-CM

## 2021-01-09 DIAGNOSIS — I10 Essential (primary) hypertension: Secondary | ICD-10-CM | POA: Diagnosis not present

## 2021-01-09 DIAGNOSIS — G2581 Restless legs syndrome: Secondary | ICD-10-CM | POA: Diagnosis not present

## 2021-01-09 DIAGNOSIS — E538 Deficiency of other specified B group vitamins: Secondary | ICD-10-CM

## 2021-01-09 DIAGNOSIS — M25641 Stiffness of right hand, not elsewhere classified: Secondary | ICD-10-CM

## 2021-01-09 DIAGNOSIS — Z0000 Encounter for general adult medical examination without abnormal findings: Secondary | ICD-10-CM

## 2021-01-09 MED ORDER — CYANOCOBALAMIN 1000 MCG/ML IJ SOLN
1000.0000 ug | Freq: Once | INTRAMUSCULAR | Status: AC
  Administered 2021-01-09: 1000 ug via INTRAMUSCULAR
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
## 2021-01-09 NOTE — Assessment & Plan Note (Signed)
Physical today 01/09/21.  Colonoscopy 11/2017.  Mammogram 07/17/20 - Birads II.

## 2021-01-10 LAB — LIPID PANEL
Cholesterol: 150 mg/dL (ref 0–200)
HDL: 61.4 mg/dL (ref >39.00)
LDL Cholesterol: 78 mg/dL (ref 0–99)
NonHDL: 88.76
Total CHOL/HDL Ratio: 2
Triglycerides: 55 mg/dL (ref 0.0–149.0)
VLDL: 11 mg/dL (ref 0.0–40.0)

## 2021-01-10 LAB — BASIC METABOLIC PANEL
BUN: 20 mg/dL (ref 6–23)
CO2: 28 mEq/L (ref 19–32)
Calcium: 9.1 mg/dL (ref 8.4–10.5)
Chloride: 103 mEq/L (ref 96–112)
Creatinine, Ser: 0.63 mg/dL (ref 0.40–1.20)
GFR: 89.8 mL/min (ref >60.00)
Glucose, Bld: 79 mg/dL (ref 70–99)
Potassium: 4 mEq/L (ref 3.5–5.1)
Sodium: 141 mEq/L (ref 135–145)

## 2021-01-10 LAB — CBC WITH DIFFERENTIAL/PLATELET
Basophils Absolute: 0 10*3/uL (ref 0.0–0.1)
Basophils Relative: 0.2 % (ref 0.0–3.0)
Eosinophils Absolute: 0.1 10*3/uL (ref 0.0–0.7)
Eosinophils Relative: 1.2 % (ref 0.0–5.0)
HCT: 38.2 % (ref 36.0–46.0)
Hemoglobin: 12.9 g/dL (ref 12.0–15.0)
Lymphocytes Relative: 21.4 % (ref 12.0–46.0)
Lymphs Abs: 0.9 10*3/uL (ref 0.7–4.0)
MCHC: 33.7 g/dL (ref 30.0–36.0)
MCV: 85.8 fl (ref 78.0–100.0)
Monocytes Absolute: 0.4 10*3/uL (ref 0.1–1.0)
Monocytes Relative: 8 % (ref 3.0–12.0)
Neutro Abs: 3.1 10*3/uL (ref 1.4–7.7)
Neutrophils Relative %: 69.2 % (ref 43.0–77.0)
Platelets: 162 10*3/uL (ref 150.0–400.0)
RBC: 4.45 Mil/uL (ref 3.87–5.11)
RDW: 14.4 % (ref 11.5–15.5)
WBC: 4.4 10*3/uL (ref 4.0–10.5)

## 2021-01-10 LAB — HEMOGLOBIN A1C: Hgb A1c MFr Bld: 5.8 % (ref 4.6–6.5)

## 2021-01-10 LAB — HEPATIC FUNCTION PANEL
ALT: 17 U/L (ref 0–35)
AST: 19 U/L (ref 0–37)
Albumin: 4.2 g/dL (ref 3.5–5.2)
Alkaline Phosphatase: 65 U/L (ref 39–117)
Bilirubin, Direct: 0.1 mg/dL (ref 0.0–0.3)
Total Bilirubin: 0.6 mg/dL (ref 0.2–1.2)
Total Protein: 6.2 g/dL (ref 6.0–8.3)

## 2021-01-10 LAB — TSH: TSH: 2.92 u[IU]/mL (ref 0.35–5.50)

## 2021-01-10 LAB — IBC + FERRITIN
Ferritin: 39.9 ng/mL (ref 10.0–291.0)
Iron: 63 ug/dL (ref 42–145)
Saturation Ratios: 19.7 % — ABNORMAL LOW (ref 20.0–50.0)
Transferrin: 229 mg/dL (ref 212.0–360.0)

## 2021-01-14 ENCOUNTER — Encounter: Payer: Self-pay | Admitting: Internal Medicine

## 2021-01-14 DIAGNOSIS — M25649 Stiffness of unspecified hand, not elsewhere classified: Secondary | ICD-10-CM | POA: Insufficient documentation

## 2021-01-14 DIAGNOSIS — Z85820 Personal history of malignant melanoma of skin: Secondary | ICD-10-CM | POA: Insufficient documentation

## 2021-01-14 NOTE — Assessment & Plan Note (Signed)
On crestor.  Low cholesterol diet and exercise.  Follow lipid panel and liver function tests.   

## 2021-01-14 NOTE — Assessment & Plan Note (Signed)
Blood pressure doing well on lisinopril and hctz.  Follow pressures.  Follow metabolic panel.

## 2021-01-14 NOTE — Assessment & Plan Note (Signed)
Followed by dermatology

## 2021-01-14 NOTE — Assessment & Plan Note (Signed)
Low carb diet and exercise.  Follow met b and a1c.   

## 2021-01-14 NOTE — Assessment & Plan Note (Signed)
Currently taking clonazepam to help with RLS.  requip did not work well.  Follow.  Discussed would like to change clonazepam in future.

## 2021-01-14 NOTE — Assessment & Plan Note (Signed)
Right fourth finger - stiff/swelling and painful.  No redness.  Unable to make a fist.  Tylenol.  Gentle use of antiinflammatories.  Given increased pain and limitation - with gripping - refer to rheumatology for evaluation and question of need for injection.

## 2021-01-14 NOTE — Assessment & Plan Note (Signed)
Followed by oncology.   On arimidex.  Stable. Last evaluated 07/2020.  Recommended f/u in 6 months. Mammogram 07/17/20 - birads II.

## 2021-01-14 NOTE — Assessment & Plan Note (Signed)
No upper symptoms reported.  On protonix.   

## 2021-01-15 ENCOUNTER — Other Ambulatory Visit: Payer: Self-pay | Admitting: Internal Medicine

## 2021-01-15 DIAGNOSIS — N6099 Unspecified benign mammary dysplasia of unspecified breast: Secondary | ICD-10-CM | POA: Diagnosis not present

## 2021-01-15 DIAGNOSIS — Z1231 Encounter for screening mammogram for malignant neoplasm of breast: Secondary | ICD-10-CM | POA: Diagnosis not present

## 2021-01-15 MED ORDER — CLONAZEPAM 0.5 MG PO TABS: ORAL_TABLET | ORAL | 1 refills | Status: DC

## 2021-01-15 NOTE — Telephone Encounter (Signed)
RX Refill:klonopin Last Seen:01-09-21 Last ordered:12-14-20

## 2021-01-22 ENCOUNTER — Other Ambulatory Visit: Payer: Self-pay | Admitting: Internal Medicine

## 2021-02-09 ENCOUNTER — Other Ambulatory Visit: Payer: Self-pay

## 2021-02-09 ENCOUNTER — Ambulatory Visit (INDEPENDENT_AMBULATORY_CARE_PROVIDER_SITE_OTHER): Payer: PPO

## 2021-02-09 DIAGNOSIS — E538 Deficiency of other specified B group vitamins: Secondary | ICD-10-CM | POA: Diagnosis not present

## 2021-02-09 MED ORDER — CYANOCOBALAMIN 1000 MCG/ML IJ SOLN
1000.0000 ug | Freq: Once | INTRAMUSCULAR | Status: AC
  Administered 2021-02-09: 1000 ug via INTRAMUSCULAR

## 2021-02-09 NOTE — Progress Notes (Signed)
Patient presented for B 12 injection to left deltoid, patient voiced no concerns nor showed any signs of distress during injection. 

## 2021-02-20 DIAGNOSIS — L409 Psoriasis, unspecified: Secondary | ICD-10-CM | POA: Diagnosis not present

## 2021-02-20 DIAGNOSIS — Z79899 Other long term (current) drug therapy: Secondary | ICD-10-CM | POA: Diagnosis not present

## 2021-02-20 DIAGNOSIS — M2550 Pain in unspecified joint: Secondary | ICD-10-CM | POA: Diagnosis not present

## 2021-02-20 DIAGNOSIS — Z111 Encounter for screening for respiratory tuberculosis: Secondary | ICD-10-CM | POA: Diagnosis not present

## 2021-02-20 DIAGNOSIS — D709 Neutropenia, unspecified: Secondary | ICD-10-CM | POA: Diagnosis not present

## 2021-02-20 DIAGNOSIS — M19041 Primary osteoarthritis, right hand: Secondary | ICD-10-CM | POA: Diagnosis not present

## 2021-02-20 DIAGNOSIS — M7062 Trochanteric bursitis, left hip: Secondary | ICD-10-CM | POA: Diagnosis not present

## 2021-02-20 DIAGNOSIS — M25541 Pain in joints of right hand: Secondary | ICD-10-CM | POA: Diagnosis not present

## 2021-02-20 DIAGNOSIS — M7989 Other specified soft tissue disorders: Secondary | ICD-10-CM | POA: Diagnosis not present

## 2021-02-20 DIAGNOSIS — L4050 Arthropathic psoriasis, unspecified: Secondary | ICD-10-CM | POA: Diagnosis not present

## 2021-02-20 DIAGNOSIS — M19042 Primary osteoarthritis, left hand: Secondary | ICD-10-CM | POA: Diagnosis not present

## 2021-02-20 DIAGNOSIS — M1811 Unilateral primary osteoarthritis of first carpometacarpal joint, right hand: Secondary | ICD-10-CM | POA: Diagnosis not present

## 2021-02-22 DIAGNOSIS — D709 Neutropenia, unspecified: Secondary | ICD-10-CM | POA: Diagnosis not present

## 2021-02-22 DIAGNOSIS — M7062 Trochanteric bursitis, left hip: Secondary | ICD-10-CM | POA: Diagnosis not present

## 2021-02-22 DIAGNOSIS — M19042 Primary osteoarthritis, left hand: Secondary | ICD-10-CM | POA: Diagnosis not present

## 2021-02-22 DIAGNOSIS — L409 Psoriasis, unspecified: Secondary | ICD-10-CM | POA: Diagnosis not present

## 2021-02-22 DIAGNOSIS — M19041 Primary osteoarthritis, right hand: Secondary | ICD-10-CM | POA: Diagnosis not present

## 2021-02-22 DIAGNOSIS — Z111 Encounter for screening for respiratory tuberculosis: Secondary | ICD-10-CM | POA: Diagnosis not present

## 2021-02-22 DIAGNOSIS — Z79899 Other long term (current) drug therapy: Secondary | ICD-10-CM | POA: Diagnosis not present

## 2021-02-22 DIAGNOSIS — M25541 Pain in joints of right hand: Secondary | ICD-10-CM | POA: Diagnosis not present

## 2021-02-22 DIAGNOSIS — L4050 Arthropathic psoriasis, unspecified: Secondary | ICD-10-CM | POA: Diagnosis not present

## 2021-03-13 ENCOUNTER — Ambulatory Visit (INDEPENDENT_AMBULATORY_CARE_PROVIDER_SITE_OTHER): Payer: PPO

## 2021-03-13 ENCOUNTER — Other Ambulatory Visit: Payer: Self-pay

## 2021-03-13 DIAGNOSIS — E538 Deficiency of other specified B group vitamins: Secondary | ICD-10-CM | POA: Diagnosis not present

## 2021-03-13 MED ORDER — CYANOCOBALAMIN 1000 MCG/ML IJ SOLN
1000.0000 ug | Freq: Once | INTRAMUSCULAR | Status: AC
  Administered 2021-03-13: 1000 ug via INTRAMUSCULAR

## 2021-03-13 NOTE — Progress Notes (Signed)
Patient presented for B 12 injection to right deltoid, patient voiced no concerns nor showed any signs of distress during injection. 

## 2021-03-26 ENCOUNTER — Ambulatory Visit (INDEPENDENT_AMBULATORY_CARE_PROVIDER_SITE_OTHER): Payer: PPO | Admitting: Internal Medicine

## 2021-03-26 ENCOUNTER — Other Ambulatory Visit: Payer: Self-pay

## 2021-03-26 DIAGNOSIS — Z86010 Personal history of colonic polyps: Secondary | ICD-10-CM | POA: Diagnosis not present

## 2021-03-26 DIAGNOSIS — R739 Hyperglycemia, unspecified: Secondary | ICD-10-CM

## 2021-03-26 DIAGNOSIS — I10 Essential (primary) hypertension: Secondary | ICD-10-CM

## 2021-03-26 DIAGNOSIS — E7800 Pure hypercholesterolemia, unspecified: Secondary | ICD-10-CM

## 2021-03-26 DIAGNOSIS — N6099 Unspecified benign mammary dysplasia of unspecified breast: Secondary | ICD-10-CM | POA: Diagnosis not present

## 2021-03-26 DIAGNOSIS — Z0000 Encounter for general adult medical examination without abnormal findings: Secondary | ICD-10-CM

## 2021-03-26 DIAGNOSIS — K219 Gastro-esophageal reflux disease without esophagitis: Secondary | ICD-10-CM

## 2021-03-26 NOTE — Assessment & Plan Note (Signed)
Physical today 03/26/21.  Colonoscopy 11/2017.  Mammogram 07/17/20 - Birads II.
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
## 2021-04-07 ENCOUNTER — Encounter: Payer: Self-pay | Admitting: Internal Medicine

## 2021-04-07 NOTE — Assessment & Plan Note (Signed)
Blood pressure doing well on lisinopril and hctz.  Follow pressures.  Follow metabolic panel.

## 2021-04-07 NOTE — Assessment & Plan Note (Signed)
Followed by oncology.   On arimidex.  Stable. Last evaluated 01/2021.  Recommended f/u in 6 months. Mammogram 07/17/20 - birads II.

## 2021-04-07 NOTE — Assessment & Plan Note (Signed)
Colonoscopy 11/2017 - rectal polyp - tubular adenoma.

## 2021-04-07 NOTE — Assessment & Plan Note (Signed)
On crestor.  Low cholesterol diet and exercise.  Follow lipid panel and liver function tests.   

## 2021-04-07 NOTE — Assessment & Plan Note (Signed)
No upper symptoms reported.  On protonix.   

## 2021-04-07 NOTE — Assessment & Plan Note (Signed)
Low carb diet and exercise.  Follow met b and a1c.   

## 2021-04-13 ENCOUNTER — Ambulatory Visit: Payer: PPO

## 2021-04-17 ENCOUNTER — Ambulatory Visit (INDEPENDENT_AMBULATORY_CARE_PROVIDER_SITE_OTHER): Payer: PPO

## 2021-04-17 ENCOUNTER — Other Ambulatory Visit: Payer: Self-pay

## 2021-04-17 DIAGNOSIS — L4050 Arthropathic psoriasis, unspecified: Secondary | ICD-10-CM | POA: Diagnosis not present

## 2021-04-17 DIAGNOSIS — Z7960 Long term (current) use of unspecified immunomodulators and immunosuppressants: Secondary | ICD-10-CM | POA: Diagnosis not present

## 2021-04-17 DIAGNOSIS — L409 Psoriasis, unspecified: Secondary | ICD-10-CM | POA: Diagnosis not present

## 2021-04-17 DIAGNOSIS — E538 Deficiency of other specified B group vitamins: Secondary | ICD-10-CM

## 2021-04-17 DIAGNOSIS — M19042 Primary osteoarthritis, left hand: Secondary | ICD-10-CM | POA: Diagnosis not present

## 2021-04-17 DIAGNOSIS — M19041 Primary osteoarthritis, right hand: Secondary | ICD-10-CM | POA: Diagnosis not present

## 2021-04-17 MED ORDER — CYANOCOBALAMIN 1000 MCG/ML IJ SOLN
1000.0000 ug | Freq: Once | INTRAMUSCULAR | Status: AC
  Administered 2021-04-17: 1000 ug via INTRAMUSCULAR

## 2021-04-17 NOTE — Progress Notes (Signed)
Patient presented for B 12 injection to left deltoid, patient voiced no concerns nor showed any signs of distress during injection. 

## 2021-04-18 DIAGNOSIS — H5051 Esophoria: Secondary | ICD-10-CM | POA: Diagnosis not present

## 2021-04-22 ENCOUNTER — Other Ambulatory Visit: Payer: Self-pay | Admitting: Internal Medicine

## 2021-04-24 ENCOUNTER — Ambulatory Visit: Payer: PPO

## 2021-04-25 ENCOUNTER — Ambulatory Visit (INDEPENDENT_AMBULATORY_CARE_PROVIDER_SITE_OTHER): Payer: PPO

## 2021-04-25 ENCOUNTER — Other Ambulatory Visit: Payer: Self-pay

## 2021-04-25 VITALS — BP 140/80

## 2021-04-25 DIAGNOSIS — Z23 Encounter for immunization: Secondary | ICD-10-CM

## 2021-04-25 NOTE — Progress Notes (Signed)
Patient here for nurse visit BP check per order from Dr. Nicki Reaper.   Patient reports compliance with prescribed BP medications: yes  BP today 140/80 manually by CMA   BP Readings from Last 3 Encounters:  04/25/21 140/80  03/26/21 138/80  01/09/21 130/80   Pulse Readings from Last 3 Encounters:  03/26/21 77  01/09/21 (!) 55  05/15/20 76   Patient bought in her BP machine and took her BP twice: the first reading was 151/94 and the second reading was 151/86 I asked the patient had she ever changed the battery and she stated she had not, I encouraged her to change the batter and I would let the provider know her results.  Patient understood.  Prince Olivier,cma   Gordy Councilman, CMA

## 2021-04-26 NOTE — Progress Notes (Signed)
LM for patient

## 2021-05-02 ENCOUNTER — Other Ambulatory Visit: Payer: Self-pay | Admitting: Internal Medicine

## 2021-05-17 ENCOUNTER — Other Ambulatory Visit: Payer: Self-pay

## 2021-05-17 ENCOUNTER — Ambulatory Visit (INDEPENDENT_AMBULATORY_CARE_PROVIDER_SITE_OTHER): Payer: PPO

## 2021-05-17 DIAGNOSIS — E538 Deficiency of other specified B group vitamins: Secondary | ICD-10-CM

## 2021-05-17 MED ORDER — CYANOCOBALAMIN 1000 MCG/ML IJ SOLN
1000.0000 ug | Freq: Once | INTRAMUSCULAR | Status: AC
Start: 2021-05-17 — End: 2021-05-17
  Administered 2021-05-17: 1000 ug via INTRAMUSCULAR

## 2021-05-17 NOTE — Progress Notes (Signed)
Patient presented for B 12 injection to right deltoid, patient voiced no concerns nor showed any signs of distress during injection. 

## 2021-05-25 ENCOUNTER — Ambulatory Visit: Payer: PPO | Admitting: Internal Medicine

## 2021-06-19 ENCOUNTER — Ambulatory Visit: Payer: PPO

## 2021-06-21 DIAGNOSIS — H532 Diplopia: Secondary | ICD-10-CM | POA: Diagnosis not present

## 2021-06-22 ENCOUNTER — Ambulatory Visit: Payer: PPO

## 2021-07-10 ENCOUNTER — Ambulatory Visit (INDEPENDENT_AMBULATORY_CARE_PROVIDER_SITE_OTHER): Payer: PPO | Admitting: Internal Medicine

## 2021-07-10 ENCOUNTER — Encounter: Payer: Self-pay | Admitting: Internal Medicine

## 2021-07-10 ENCOUNTER — Other Ambulatory Visit: Payer: Self-pay

## 2021-07-10 VITALS — BP 132/76 | HR 89 | Temp 97.9°F | Resp 16 | Ht 64.0 in | Wt 177.4 lb

## 2021-07-10 DIAGNOSIS — R739 Hyperglycemia, unspecified: Secondary | ICD-10-CM | POA: Diagnosis not present

## 2021-07-10 DIAGNOSIS — N6099 Unspecified benign mammary dysplasia of unspecified breast: Secondary | ICD-10-CM

## 2021-07-10 DIAGNOSIS — E7800 Pure hypercholesterolemia, unspecified: Secondary | ICD-10-CM

## 2021-07-10 DIAGNOSIS — E538 Deficiency of other specified B group vitamins: Secondary | ICD-10-CM | POA: Diagnosis not present

## 2021-07-10 DIAGNOSIS — L4050 Arthropathic psoriasis, unspecified: Secondary | ICD-10-CM

## 2021-07-10 DIAGNOSIS — K219 Gastro-esophageal reflux disease without esophagitis: Secondary | ICD-10-CM

## 2021-07-10 DIAGNOSIS — D696 Thrombocytopenia, unspecified: Secondary | ICD-10-CM | POA: Diagnosis not present

## 2021-07-10 DIAGNOSIS — I10 Essential (primary) hypertension: Secondary | ICD-10-CM

## 2021-07-10 DIAGNOSIS — Z86010 Personal history of colonic polyps: Secondary | ICD-10-CM | POA: Diagnosis not present

## 2021-07-10 LAB — CBC WITH DIFFERENTIAL/PLATELET
Basophils Absolute: 0 10*3/uL (ref 0.0–0.1)
Basophils Relative: 0.7 % (ref 0.0–3.0)
Eosinophils Absolute: 0.1 10*3/uL (ref 0.0–0.7)
Eosinophils Relative: 1.9 % (ref 0.0–5.0)
HCT: 41.3 % (ref 36.0–46.0)
Hemoglobin: 13.7 g/dL (ref 12.0–15.0)
Lymphocytes Relative: 23.4 % (ref 12.0–46.0)
Lymphs Abs: 0.9 10*3/uL (ref 0.7–4.0)
MCHC: 33.1 g/dL (ref 30.0–36.0)
MCV: 86.8 fl (ref 78.0–100.0)
Monocytes Absolute: 0.4 10*3/uL (ref 0.1–1.0)
Monocytes Relative: 9.5 % (ref 3.0–12.0)
Neutro Abs: 2.4 10*3/uL (ref 1.4–7.7)
Neutrophils Relative %: 64.5 % (ref 43.0–77.0)
Platelets: 158 10*3/uL (ref 150.0–400.0)
RBC: 4.75 Mil/uL (ref 3.87–5.11)
RDW: 14.1 % (ref 11.5–15.5)
WBC: 3.8 10*3/uL — ABNORMAL LOW (ref 4.0–10.5)

## 2021-07-10 LAB — HEPATIC FUNCTION PANEL
ALT: 16 U/L (ref 0–35)
AST: 17 U/L (ref 0–37)
Albumin: 4.3 g/dL (ref 3.5–5.2)
Alkaline Phosphatase: 65 U/L (ref 39–117)
Bilirubin, Direct: 0.1 mg/dL (ref 0.0–0.3)
Total Bilirubin: 0.6 mg/dL (ref 0.2–1.2)
Total Protein: 6.1 g/dL (ref 6.0–8.3)

## 2021-07-10 LAB — LIPID PANEL
Cholesterol: 155 mg/dL (ref 0–200)
HDL: 62.4 mg/dL (ref >39.00)
LDL Cholesterol: 79 mg/dL (ref 0–99)
NonHDL: 92.92
Total CHOL/HDL Ratio: 2
Triglycerides: 71 mg/dL (ref 0.0–149.0)
VLDL: 14.2 mg/dL (ref 0.0–40.0)

## 2021-07-10 LAB — BASIC METABOLIC PANEL
BUN: 18 mg/dL (ref 6–23)
CO2: 31 mEq/L (ref 19–32)
Calcium: 9.3 mg/dL (ref 8.4–10.5)
Chloride: 105 mEq/L (ref 96–112)
Creatinine, Ser: 0.74 mg/dL (ref 0.40–1.20)
GFR: 81.62 mL/min (ref >60.00)
Glucose, Bld: 96 mg/dL (ref 70–99)
Potassium: 4.2 mEq/L (ref 3.5–5.1)
Sodium: 141 mEq/L (ref 135–145)

## 2021-07-10 LAB — HEMOGLOBIN A1C: Hgb A1c MFr Bld: 5.6 % (ref 4.6–6.5)

## 2021-07-10 MED ORDER — CYANOCOBALAMIN 1000 MCG/ML IJ SOLN
1000.0000 ug | Freq: Once | INTRAMUSCULAR | Status: AC
  Administered 2021-07-10: 1000 ug via INTRAMUSCULAR

## 2021-07-10 NOTE — Assessment & Plan Note (Signed)
Colonoscopy 11/2017 - rectal polyp - tubular adenoma.
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
## 2021-07-10 NOTE — Assessment & Plan Note (Signed)
Recheck cbc with next labs.  Platelet count slightly decreased last check.

## 2021-07-10 NOTE — Assessment & Plan Note (Signed)
Evaluated by rheumatology.  Desires not to take humira.  Follow.  

## 2021-07-10 NOTE — Assessment & Plan Note (Signed)
Followed by oncology.   On arimidex.  Stable. Last evaluated 01/2021.  Recommended f/u in 6 months. Mammogram 07/17/20 - birads II.

## 2021-07-10 NOTE — Assessment & Plan Note (Signed)
On crestor.  Low cholesterol diet and exercise.  Follow lipid panel and liver function tests.   

## 2021-07-10 NOTE — Assessment & Plan Note (Signed)
Blood pressure as outlined. Oon lisinopril and hctz.  Follow pressures.  Follow metabolic panel.  

## 2021-07-10 NOTE — Assessment & Plan Note (Signed)
No upper symptoms reported.  On protonix.   

## 2021-07-10 NOTE — Assessment & Plan Note (Signed)
Low carb diet and exercise.  Follow met b and a1c.

## 2021-07-12 ENCOUNTER — Encounter: Payer: Self-pay | Admitting: Internal Medicine

## 2021-07-12 DIAGNOSIS — D2261 Melanocytic nevi of right upper limb, including shoulder: Secondary | ICD-10-CM | POA: Diagnosis not present

## 2021-07-12 DIAGNOSIS — L538 Other specified erythematous conditions: Secondary | ICD-10-CM | POA: Diagnosis not present

## 2021-07-12 DIAGNOSIS — Z85820 Personal history of malignant melanoma of skin: Secondary | ICD-10-CM | POA: Diagnosis not present

## 2021-07-12 DIAGNOSIS — D225 Melanocytic nevi of trunk: Secondary | ICD-10-CM | POA: Diagnosis not present

## 2021-07-12 DIAGNOSIS — R202 Paresthesia of skin: Secondary | ICD-10-CM | POA: Diagnosis not present

## 2021-07-12 DIAGNOSIS — L821 Other seborrheic keratosis: Secondary | ICD-10-CM | POA: Diagnosis not present

## 2021-07-12 DIAGNOSIS — L820 Inflamed seborrheic keratosis: Secondary | ICD-10-CM | POA: Diagnosis not present

## 2021-07-12 DIAGNOSIS — R208 Other disturbances of skin sensation: Secondary | ICD-10-CM | POA: Diagnosis not present

## 2021-07-19 ENCOUNTER — Other Ambulatory Visit: Payer: Self-pay | Admitting: Internal Medicine

## 2021-07-23 DIAGNOSIS — I10 Essential (primary) hypertension: Secondary | ICD-10-CM | POA: Diagnosis not present

## 2021-07-23 DIAGNOSIS — N6099 Unspecified benign mammary dysplasia of unspecified breast: Secondary | ICD-10-CM | POA: Diagnosis not present

## 2021-07-23 DIAGNOSIS — Z1231 Encounter for screening mammogram for malignant neoplasm of breast: Secondary | ICD-10-CM | POA: Diagnosis not present

## 2021-07-23 LAB — HM MAMMOGRAPHY

## 2021-08-08 ENCOUNTER — Other Ambulatory Visit: Payer: Self-pay

## 2021-08-08 ENCOUNTER — Ambulatory Visit (INDEPENDENT_AMBULATORY_CARE_PROVIDER_SITE_OTHER): Payer: PPO | Admitting: *Deleted

## 2021-08-08 DIAGNOSIS — E538 Deficiency of other specified B group vitamins: Secondary | ICD-10-CM | POA: Diagnosis not present

## 2021-08-08 MED ORDER — CYANOCOBALAMIN 1000 MCG/ML IJ SOLN
1000.0000 ug | Freq: Once | INTRAMUSCULAR | Status: AC
  Administered 2021-08-08: 1000 ug via INTRAMUSCULAR

## 2021-08-08 NOTE — Progress Notes (Signed)
Pt arrived for B12 injection, given in R deltoid. Pt tolerated injection well, showed no signs of distress nor voiced any concerns.  

## 2021-09-12 ENCOUNTER — Ambulatory Visit: Payer: PPO

## 2021-09-24 ENCOUNTER — Telehealth: Payer: Self-pay | Admitting: Internal Medicine

## 2021-09-24 NOTE — Telephone Encounter (Signed)
Attempted to schedule AWV. Unable to LVM.  Will try at later time.  Mail box full 

## 2021-09-27 ENCOUNTER — Ambulatory Visit (INDEPENDENT_AMBULATORY_CARE_PROVIDER_SITE_OTHER): Payer: PPO

## 2021-09-27 DIAGNOSIS — E538 Deficiency of other specified B group vitamins: Secondary | ICD-10-CM

## 2021-09-27 MED ORDER — CYANOCOBALAMIN 1000 MCG/ML IJ SOLN
1000.0000 ug | Freq: Once | INTRAMUSCULAR | Status: AC
  Administered 2021-09-27: 1000 ug via INTRAMUSCULAR

## 2021-09-27 NOTE — Progress Notes (Signed)
Viann Fish presents today for injection per MD orders. ?B12 injection ? administered IM in right Upper Arm. ?Administration without incident. ?Patient tolerated well. ?Kevontay Burks,cma  ?

## 2021-10-02 ENCOUNTER — Telehealth: Payer: Self-pay | Admitting: Internal Medicine

## 2021-10-02 NOTE — Telephone Encounter (Signed)
Copied from [place] [id number]. Topic: Medicare AWV ?>> Oct 02, 2021  2:36 PM Harris-Coley, Hannah Beat wrote: ?Reason for CRM: Attempted to schedule AWV. Unable to LVM.  Will try at later time. Mailbox full ?

## 2021-10-12 ENCOUNTER — Ambulatory Visit: Payer: PPO

## 2021-10-12 ENCOUNTER — Other Ambulatory Visit: Payer: Self-pay | Admitting: Internal Medicine

## 2021-10-29 ENCOUNTER — Ambulatory Visit: Payer: PPO

## 2021-11-01 ENCOUNTER — Other Ambulatory Visit: Payer: Self-pay | Admitting: Internal Medicine

## 2021-11-06 ENCOUNTER — Telehealth: Payer: Self-pay | Admitting: Internal Medicine

## 2021-11-06 NOTE — Telephone Encounter (Signed)
Copied from [place] [id number]. Topic: Medicare AWV >> Nov 06, 2021  2:54 PM Harris-Coley, Hannah Beat wrote: Reason for CRM: Attempted to schedule AWV. Unable to LVM.  Will try at later time.

## 2021-11-07 ENCOUNTER — Ambulatory Visit: Payer: PPO

## 2021-11-07 ENCOUNTER — Ambulatory Visit: Payer: PPO | Admitting: Internal Medicine

## 2021-11-13 ENCOUNTER — Ambulatory Visit (INDEPENDENT_AMBULATORY_CARE_PROVIDER_SITE_OTHER): Payer: PPO

## 2021-11-13 DIAGNOSIS — E538 Deficiency of other specified B group vitamins: Secondary | ICD-10-CM

## 2021-11-13 MED ORDER — CYANOCOBALAMIN 1000 MCG/ML IJ SOLN
1000.0000 ug | Freq: Once | INTRAMUSCULAR | Status: AC
  Administered 2021-11-13: 1000 ug via INTRAMUSCULAR

## 2021-11-13 NOTE — Progress Notes (Signed)
Pt presented today for b12 injection. Left deltoid, IM. Pt voiced no concerns nor showed any signs of distress during injection. 

## 2021-12-12 ENCOUNTER — Ambulatory Visit (INDEPENDENT_AMBULATORY_CARE_PROVIDER_SITE_OTHER): Payer: PPO | Admitting: Internal Medicine

## 2021-12-12 ENCOUNTER — Encounter: Payer: Self-pay | Admitting: Internal Medicine

## 2021-12-12 VITALS — BP 128/84 | HR 82 | Temp 98.7°F | Resp 17 | Ht 64.0 in | Wt 187.6 lb

## 2021-12-12 DIAGNOSIS — E538 Deficiency of other specified B group vitamins: Secondary | ICD-10-CM | POA: Diagnosis not present

## 2021-12-12 DIAGNOSIS — K625 Hemorrhage of anus and rectum: Secondary | ICD-10-CM

## 2021-12-12 DIAGNOSIS — R739 Hyperglycemia, unspecified: Secondary | ICD-10-CM | POA: Diagnosis not present

## 2021-12-12 DIAGNOSIS — D696 Thrombocytopenia, unspecified: Secondary | ICD-10-CM | POA: Diagnosis not present

## 2021-12-12 DIAGNOSIS — E7800 Pure hypercholesterolemia, unspecified: Secondary | ICD-10-CM

## 2021-12-12 DIAGNOSIS — L4050 Arthropathic psoriasis, unspecified: Secondary | ICD-10-CM

## 2021-12-12 DIAGNOSIS — I10 Essential (primary) hypertension: Secondary | ICD-10-CM

## 2021-12-12 DIAGNOSIS — Z86010 Personal history of colonic polyps: Secondary | ICD-10-CM | POA: Diagnosis not present

## 2021-12-12 DIAGNOSIS — N6099 Unspecified benign mammary dysplasia of unspecified breast: Secondary | ICD-10-CM | POA: Diagnosis not present

## 2021-12-12 DIAGNOSIS — K219 Gastro-esophageal reflux disease without esophagitis: Secondary | ICD-10-CM | POA: Diagnosis not present

## 2021-12-12 LAB — CBC WITH DIFFERENTIAL/PLATELET
Basophils Absolute: 0 10*3/uL (ref 0.0–0.1)
Basophils Relative: 0.5 % (ref 0.0–3.0)
Eosinophils Absolute: 0.1 10*3/uL (ref 0.0–0.7)
Eosinophils Relative: 1.5 % (ref 0.0–5.0)
HCT: 38.6 % (ref 36.0–46.0)
Hemoglobin: 13 g/dL (ref 12.0–15.0)
Lymphocytes Relative: 24.6 % (ref 12.0–46.0)
Lymphs Abs: 1 10*3/uL (ref 0.7–4.0)
MCHC: 33.6 g/dL (ref 30.0–36.0)
MCV: 85.8 fl (ref 78.0–100.0)
Monocytes Absolute: 0.4 10*3/uL (ref 0.1–1.0)
Monocytes Relative: 9.6 % (ref 3.0–12.0)
Neutro Abs: 2.5 10*3/uL (ref 1.4–7.7)
Neutrophils Relative %: 63.8 % (ref 43.0–77.0)
Platelets: 159 10*3/uL (ref 150.0–400.0)
RBC: 4.5 Mil/uL (ref 3.87–5.11)
RDW: 14.1 % (ref 11.5–15.5)
WBC: 3.9 10*3/uL — ABNORMAL LOW (ref 4.0–10.5)

## 2021-12-12 LAB — LIPID PANEL
Cholesterol: 141 mg/dL (ref 0–200)
HDL: 59.7 mg/dL (ref >39.00)
LDL Cholesterol: 70 mg/dL (ref 0–99)
NonHDL: 81.79
Total CHOL/HDL Ratio: 2
Triglycerides: 61 mg/dL (ref 0.0–149.0)
VLDL: 12.2 mg/dL (ref 0.0–40.0)

## 2021-12-12 LAB — BASIC METABOLIC PANEL
BUN: 19 mg/dL (ref 6–23)
CO2: 28 mEq/L (ref 19–32)
Calcium: 9.2 mg/dL (ref 8.4–10.5)
Chloride: 105 mEq/L (ref 96–112)
Creatinine, Ser: 0.7 mg/dL (ref 0.40–1.20)
GFR: 86.98 mL/min (ref >60.00)
Glucose, Bld: 90 mg/dL (ref 70–99)
Potassium: 4.3 mEq/L (ref 3.5–5.1)
Sodium: 141 mEq/L (ref 135–145)

## 2021-12-12 LAB — TSH: TSH: 3.41 u[IU]/mL (ref 0.35–5.50)

## 2021-12-12 LAB — HEMOGLOBIN A1C: Hgb A1c MFr Bld: 5.9 % (ref 4.6–6.5)

## 2021-12-12 MED ORDER — CYANOCOBALAMIN 1000 MCG/ML IJ SOLN
1000.0000 ug | Freq: Once | INTRAMUSCULAR | Status: AC
  Administered 2021-12-12: 1000 ug via INTRAMUSCULAR
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
## 2021-12-13 ENCOUNTER — Encounter: Payer: Self-pay | Admitting: Internal Medicine

## 2021-12-13 ENCOUNTER — Telehealth: Payer: Self-pay

## 2021-12-13 ENCOUNTER — Other Ambulatory Visit (INDEPENDENT_AMBULATORY_CARE_PROVIDER_SITE_OTHER): Payer: PPO

## 2021-12-13 DIAGNOSIS — E7800 Pure hypercholesterolemia, unspecified: Secondary | ICD-10-CM

## 2021-12-13 LAB — HEPATIC FUNCTION PANEL
ALT: 20 U/L (ref 0–35)
AST: 20 U/L (ref 0–37)
Albumin: 4.4 g/dL (ref 3.5–5.2)
Alkaline Phosphatase: 63 U/L (ref 39–117)
Bilirubin, Direct: 0.1 mg/dL (ref 0.0–0.3)
Total Bilirubin: 0.5 mg/dL (ref 0.2–1.2)
Total Protein: 6.1 g/dL (ref 6.0–8.3)

## 2021-12-13 NOTE — Telephone Encounter (Signed)
-----   Message from Einar Pheasant, MD sent at 12/13/2021  5:03 AM EDT ----- Notify hgb  and platelet count are wnl.  White blood cell count is stable. Cholesterol levels look good. Overall sugar control increased some when compared to the previous check.  I recommend a low carb diet and exercise.  We will follow. Thyroid test and kidney function tests are wnl.

## 2021-12-13 NOTE — Assessment & Plan Note (Signed)
Followed by oncology.   On arimidex.  Stable.  Mammogram 07/23/21 - birads II.  [facility])

## 2021-12-13 NOTE — Assessment & Plan Note (Signed)
Blood pressure as outlined. Oon lisinopril and hctz.  Follow pressures.  Follow metabolic panel.  

## 2021-12-13 NOTE — Assessment & Plan Note (Signed)
Colonoscopy 11/2017 - rectal polyp - tubular adenoma. Recommended f/u in 5 years.  Given rectal bleeding, refer back to Dr Bary Castilla - evaluation - colonoscopy.

## 2021-12-13 NOTE — Assessment & Plan Note (Signed)
No upper symptoms reported.  On protonix.   

## 2021-12-13 NOTE — Assessment & Plan Note (Signed)
Low carb diet and exercise.  Follow met b and a1c.   

## 2021-12-13 NOTE — Assessment & Plan Note (Signed)
Recheck cbc today to confirm wnl.

## 2021-12-13 NOTE — Telephone Encounter (Signed)
Attempted to call pt - no ans, vm full

## 2021-12-13 NOTE — Assessment & Plan Note (Signed)
Episodes of BRBPR as outlined.  Question of hemorrhoidal bleed, etc.  Heme negative on exam today.  Check cbc.  No active bleeding currently.  Last colonoscopy 2019.  Refer back to Dr Bary Castilla for evaluation - for f/u colonoscopy.

## 2021-12-13 NOTE — Assessment & Plan Note (Signed)
On crestor.  Low cholesterol diet and exercise.  Follow lipid panel and liver function tests.   

## 2021-12-13 NOTE — Assessment & Plan Note (Signed)
Evaluated by rheumatology.  Desires not to take humira.  Follow.  

## 2021-12-13 NOTE — Addendum Note (Signed)
Addended by: Alisa Graff on: 12/13/2021 05:05 AM   Modules accepted: Orders

## 2021-12-18 ENCOUNTER — Telehealth: Payer: Self-pay | Admitting: Internal Medicine

## 2021-12-18 DIAGNOSIS — K641 Second degree hemorrhoids: Secondary | ICD-10-CM | POA: Diagnosis not present

## 2021-12-18 DIAGNOSIS — K625 Hemorrhage of anus and rectum: Secondary | ICD-10-CM | POA: Diagnosis not present

## 2021-12-18 NOTE — Telephone Encounter (Signed)
Copied from [place] [id number]. Topic: Medicare AWV >> Dec 18, 2021 11:10 AM Devoria Glassing wrote: Reason for CRM: Left message for patient to schedule Annual Wellness Visit.  Please schedule with Nurse Health Advisor Denisa O'Brien-Blaney, LPN at [facility]. This appt can be telephone or office visit.  Please call [phone] ask for [facility]

## 2021-12-19 ENCOUNTER — Other Ambulatory Visit: Payer: Self-pay | Admitting: General Surgery

## 2021-12-19 NOTE — Progress Notes (Signed)
Progress Notes - documented in this encounter Mckinzee Spirito, Geronimo Boot, MD - 12/18/2021 11:00 AM EDT Formatting of this note is different from the original. Images from the original note were not included. Subjective:   Patient ID: Melissa Rhodes is a 71 y.o. female.  HPI  The following portions of the patient's history were reviewed and updated as appropriate.  This an established patient is here today for: office visit. The patient has been referred by Dr. Einar Pheasant for evaluation of rectal bleeding. Patient reports she has had 4 episodes of bright red rectal bleeding in the last couple of months. The patient states she felt like she was having to strain with bowel movement when she noticed the bleeding. She does have a history of colon cancer in 2005. Her last upper and lower endoscopy were completed on 11-19-17. Patient reports bowel movements daily.   The patient denies any anorectal trauma or participation in anal receptive sex.  No particular pain with defecation. No reported difficulty with cleaning the perianal area.  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   COLONOSCOPY N/A 08/09/2004  Tubular adenomas of the distal transverse colon. No evidence of residual villous adenoma in the sigmoid.  SHOULDER ARTHROSCOPY W/ SUBACROMIAL DECOMPRESSION AND DISTAL CLAVICLE EXCISION Right 01/23/2006  kidney stones 2009  breast surgery Left 2013  INCISIONAL BIOPSY BREAST Left 2013  COLONOSCOPY 11/19/2012  Polypoid colonic mucosa with fibrosis and inflammation, consistent with inflammatory polyp.  breast surgery Right 03/06/2017  radial scar  COLONOSCOPY 11/19/2017  Tubular adenoma of the rectum x 2.  EGD 11/19/2017  EXTRACTION TEETH  Wisdom teeth  Lipoma excision  skin cancer removal Left  left arm    OB History   Gravida  3  Para  2  Term   Preterm   AB   Living     SAB   IAB   Ectopic   Molar   Multiple   Live Births     Obstetric Comments  Age at first period 83 Age of first pregnancy 21 Patient had one ectopic pregnancy.       Social History   Socioeconomic History  Marital status: Married  Occupational History  Occupation: Retired  Tobacco Use  Smoking status: Never  Smokeless tobacco: Never  Surveyor, mining Use: Never used  Substance and Sexual Activity  Alcohol use: No  Alcohol/week: 0.0 standard drinks  Drug use: No  Sexual activity: Defer    No Known Allergies  Current Outpatient Medications  Medication Sig Dispense Refill  anastrozole (ARIMIDEX) 1 mg tablet Take 1 mg by mouth once daily 2  aspirin-calcium carbonate 81 mg-300 mg  calcium(777 mg) Tab Take by mouth  cholecalciferol (VITAMIN D3) 1000 unit tablet Take by mouth  fluocinonide (LIDEX) 0.05 % cream as directed 0  glucosam/chond/hyalu/CF borate (MOVE FREE JOINT HEALTH ORAL) Take by mouth once daily  hydroCHLOROthiazide (HYDRODIURIL) 25 MG tablet TAKE 1 TABLET BY MOUTH ONCE DAILY 90 tablet 0  lisinopril (PRINIVIL,ZESTRIL) 10 MG tablet Take by mouth  magnesium 250 mg Tab  magnesium oxide (MAG-OX) 400 mg (241.3 mg magnesium) tablet  Take 400 mg by mouth once daily  multivitamin tablet Take 1 tablet by mouth once daily  pantoprazole (PROTONIX) 40 MG DR tablet TAKE 1 TABLET BY MOUTH DAILY 30 MINUTES BEFORE EVENING MEAL 1  potassium chloride (K-DUR, KLOR-CON) 10 mEq ER tablet Take 10 mEq by mouth 2 (two) times daily.  rOPINIRole (REQUIP) 1 MG tablet Take 1 tablet (1 mg total) by mouth nightly. 90 tablet 1  rosuvastatin (CRESTOR) 20 MG tablet Take 1 tablet by mouth once daily  cyanocobalamin (VITAMIN B12) 1,000 mcg/mL injection Inject into the muscle monthly  hydrocortisone-pramoxine (ANALPRAM-HC) 2.5-1 % rectal cream Apply pea sized amount to extra skin around the anus. 30 g 0  metoclopramide (REGLAN) 10 MG tablet Take one tablet 30 minutes before prep, repeat in four hours if needed for nausea. 2 tablet 0  polyethylene glycol (MIRALAX) powder One bottle for colonoscopy prep. Use as directed. 255 g 0   No current facility-administered medications for this visit.   Family History  Problem Relation Age of Onset  Stroke Mother  Hyperlipidemia (Elevated cholesterol) Mother  Arthritis Mother  Heart failure Mother  Diabetes type II Mother  Emphysema Mother  High blood pressure (Hypertension) Mother  Depression Mother  Lung cancer Father 26  Early death Father  Diabetes Sister  Hyperlipidemia (Elevated cholesterol) Sister  High blood pressure (Hypertension) Sister  Depression Sister  Depression Sister  High blood pressure (Hypertension) Sister  Hyperlipidemia (Elevated cholesterol) Sister  Miscarriages / Stillbirths Sister  Hyperlipidemia (Elevated cholesterol) Sister  High blood pressure (Hypertension) Sister  Depression Sister  Hyperlipidemia (Elevated cholesterol) Brother  Depression Brother  Diabetes Brother  Diabetes type II Brother  High blood pressure (Hypertension) Brother  Stroke Brother  Myocardial Infarction (Heart attack) Brother   Labs and Radiology:   December 13, 2021 laboratory:  Total Bilirubin  0.2 - 1.2 mg/dL 0.5  Bilirubin, Direct 0.0 - 0.3 mg/dL 0.1  Alkaline Phosphatase 39 - 117 U/L 63  AST 0 - 37 U/L 20  ALT 0 - 35 U/L 20  Total Protein 6.0 - 8.3 g/dL 6.1  Albumin 3.5 - 5.2 g/dL 4.4   Sodium 135 - 145 mEq/L 141  Potassium 3.5 - 5.1 mEq/L 4.3  Chloride 96 - 112 mEq/L 105  CO2 19 - 32 mEq/L 28  Glucose, Bld 70 - 99 mg/dL 90  BUN 6 - 23 mg/dL 19  Creatinine, Ser 0.40 - 1.20 mg/dL 0.70  GFR >60.00 mL/min 86.98  Comment: Calculated using the CKD-EPI Creatinine Equation (2021) Calcium 8.4 - 10.5 mg/dL 9.2   WBC 4.0 - 10.5 K/uL 3.9 Low  RBC 3.87 - 5.11 Mil/uL 4.50  Hemoglobin 12.0 - 15.0 g/dL 13.0  HCT 36.0 - 46.0 % 38.6  MCV 78.0 - 100.0 fl 85.8  MCHC 30.0 - 36.0 g/dL 33.6  RDW 11.5 - 15.5 % 14.1  Platelets 150.0 - 400.0 K/uL 159.0  Neutrophils Relative % 43.0 - 77.0 % 63.8  Lymphocytes Relative 12.0 - 46.0 % 24.6  Monocytes Relative 3.0 - 12.0 % 9.6  Eosinophils Relative  0.0 - 5.0 % 1.5  Basophils Relative 0.0 - 3.0 % 0.5  Neutro Abs 1.4 - 7.7 K/uL 2.5  Lymphs Abs 0.7 - 4.0 K/uL 1.0  Monocytes Absolute 0.1 - 1.0 K/uL 0.4  Eosinophils Absolute 0.0 - 0.7 K/uL 0.1  Basophils Absolute 0.0 - 0.1 K/uL 0.0   WBC has been between 3.4 and 4.4 thousand dating back 18 months. Normal differential.  November 19, 2017 colonoscopy reviewed. No particular anorectal pathology appreciated at that time.  Review of Systems  Constitutional: Negative for chills and fever.  Respiratory: Negative for cough.    Objective:  Physical Exam Exam conducted with a chaperone present.  Constitutional:  Appearance: Normal appearance.  Cardiovascular:  Rate and Rhythm: Normal rate and regular rhythm.  Pulses: Normal pulses.  Heart sounds: Normal heart sounds.  Pulmonary:  Effort: Pulmonary effort is normal.  Breath sounds: Normal breath sounds.  Genitourinary: Rectum: Guaiac result negative. Anal fissure and external hemorrhoid present. Normal anal tone.   Comments: Pedunculated  polyp at the 5 o'clock position, dorsal lithotomy, with midline focal fissure with friability of the adjacent anal skin. Musculoskeletal:  Cervical back: Neck supple.  Skin: General: Skin is warm and dry.  Neurological:  Mental Status: She is alert and oriented to person, place, and time.  Psychiatric:  Mood and Affect: Mood normal.  Behavior: Behavior normal.    Assessment:   Likely small posterior fissure and associated anal skin tag or source of intermittent bleeding with straining.  Visualized lower rectal mucosa was normal, but in light of her previous history of multiple polyps including an in situ adenocarcinoma of the sigmoid in 2004, repeat colonoscopy is indicated.  Plan:   The patient reports that she had some nausea with the colonoscopy prep in 2019. For that reason we will have her make use of Reglan 10 mg 30 minutes before beginning the prep and 4 hours later if required for nausea.  A prescription for Reglan to be applied 3 times daily to the anal fissure and skin tag between now and the time of his colonoscopy to assess response to treatment has been recommended.  This note is partially prepared by Ledell Noss, CMA acting as a scribe in the presence of Dr. Hervey Ard, MD.   The documentation recorded by the scribe accurately reflects the service I personally performed and the decisions made by me.   Robert Bellow, MD FACS   Electronically signed by Mayer Masker, MD at 12/19/2021 12:25 PM EDT

## 2022-01-01 ENCOUNTER — Encounter: Payer: Self-pay | Admitting: General Surgery

## 2022-01-02 ENCOUNTER — Ambulatory Visit: Payer: PPO | Admitting: Certified Registered"

## 2022-01-02 ENCOUNTER — Ambulatory Visit
Admission: RE | Admit: 2022-01-02 | Discharge: 2022-01-02 | Disposition: A | Discharge status: Home / Self Care | Payer: PPO | Source: Ambulatory Visit | Attending: General Surgery | Admitting: General Surgery

## 2022-01-02 ENCOUNTER — Encounter
Admission: RE | Disposition: A | Discharge status: Home / Self Care | Payer: Self-pay | Source: Ambulatory Visit | Attending: General Surgery

## 2022-01-02 DIAGNOSIS — Z86010 Personal history of colonic polyps: Secondary | ICD-10-CM | POA: Insufficient documentation

## 2022-01-02 DIAGNOSIS — K635 Polyp of colon: Secondary | ICD-10-CM | POA: Diagnosis not present

## 2022-01-02 DIAGNOSIS — G2581 Restless legs syndrome: Secondary | ICD-10-CM | POA: Diagnosis not present

## 2022-01-02 DIAGNOSIS — D120 Benign neoplasm of cecum: Secondary | ICD-10-CM | POA: Diagnosis not present

## 2022-01-02 DIAGNOSIS — K5730 Diverticulosis of large intestine without perforation or abscess without bleeding: Secondary | ICD-10-CM | POA: Diagnosis not present

## 2022-01-02 DIAGNOSIS — K5790 Diverticulosis of intestine, part unspecified, without perforation or abscess without bleeding: Secondary | ICD-10-CM | POA: Diagnosis not present

## 2022-01-02 DIAGNOSIS — Z85820 Personal history of malignant melanoma of skin: Secondary | ICD-10-CM | POA: Diagnosis not present

## 2022-01-02 DIAGNOSIS — I129 Hypertensive chronic kidney disease with stage 1 through stage 4 chronic kidney disease, or unspecified chronic kidney disease: Secondary | ICD-10-CM | POA: Insufficient documentation

## 2022-01-02 DIAGNOSIS — Z79899 Other long term (current) drug therapy: Secondary | ICD-10-CM | POA: Diagnosis not present

## 2022-01-02 DIAGNOSIS — N189 Chronic kidney disease, unspecified: Secondary | ICD-10-CM | POA: Insufficient documentation

## 2022-01-02 DIAGNOSIS — Z85038 Personal history of other malignant neoplasm of large intestine: Secondary | ICD-10-CM | POA: Diagnosis not present

## 2022-01-02 DIAGNOSIS — K625 Hemorrhage of anus and rectum: Secondary | ICD-10-CM | POA: Insufficient documentation

## 2022-01-02 DIAGNOSIS — K219 Gastro-esophageal reflux disease without esophagitis: Secondary | ICD-10-CM | POA: Diagnosis not present

## 2022-01-02 DIAGNOSIS — E669 Obesity, unspecified: Secondary | ICD-10-CM | POA: Diagnosis not present

## 2022-01-02 DIAGNOSIS — Z6832 Body mass index (BMI) 32.0-32.9, adult: Secondary | ICD-10-CM | POA: Insufficient documentation

## 2022-01-02 HISTORY — DX: Personal history of other infectious and parasitic diseases: Z86.19

## 2022-01-02 HISTORY — DX: Personal history of urinary calculi: Z87.442

## 2022-01-02 HISTORY — DX: Neutropenia, unspecified: D70.9

## 2022-01-02 HISTORY — DX: Chronic kidney disease, unspecified: N18.9

## 2022-01-02 HISTORY — PX: COLONOSCOPY WITH PROPOFOL: SHX5780

## 2022-01-02 HISTORY — DX: Restless legs syndrome: G25.81

## 2022-01-02 SURGERY — COLONOSCOPY WITH PROPOFOL
Anesthesia: General

## 2022-01-02 MED ORDER — MIDAZOLAM HCL 2 MG/2ML IJ SOLN
INTRAMUSCULAR | Status: DC | PRN
  Administered 2022-01-02: 2 mg via INTRAVENOUS

## 2022-01-02 MED ORDER — PROPOFOL 500 MG/50ML IV EMUL
INTRAVENOUS | Status: DC | PRN
  Administered 2022-01-02: 150 ug/kg/min via INTRAVENOUS

## 2022-01-02 MED ORDER — SODIUM CHLORIDE 0.9 % IV SOLN: INTRAVENOUS | Status: DC | PRN

## 2022-01-02 MED ORDER — LIDOCAINE HCL (CARDIAC) PF 100 MG/5ML IV SOSY
PREFILLED_SYRINGE | INTRAVENOUS | Status: DC | PRN
  Administered 2022-01-02: 60 mg via INTRAVENOUS

## 2022-01-02 MED ORDER — ONDANSETRON HCL 4 MG/2ML IJ SOLN
INTRAMUSCULAR | Status: DC | PRN
  Administered 2022-01-02: 4 mg via INTRAVENOUS

## 2022-01-02 MED ORDER — MIDAZOLAM HCL 2 MG/2ML IJ SOLN
INTRAMUSCULAR | Status: AC
  Filled 2022-01-02: qty 2

## 2022-01-02 MED ORDER — PROPOFOL 10 MG/ML IV BOLUS
INTRAVENOUS | Status: DC | PRN
  Administered 2022-01-02: 50 mg via INTRAVENOUS

## 2022-01-02 MED ORDER — SODIUM CHLORIDE 0.9 % IV SOLN: INTRAVENOUS | Status: DC

## 2022-01-02 NOTE — Transfer of Care (Signed)
Immediate Anesthesia Transfer of Care Note  Patient: Melissa Rhodes  Procedure(s) Performed: COLONOSCOPY WITH PROPOFOL  Patient Location: PACU and Endoscopy Unit  Anesthesia Type:MAC  Level of Consciousness: drowsy  Airway & Oxygen Therapy: Patient Spontanous Breathing and Patient connected to nasal cannula oxygen  Post-op Assessment: Report given to RN and Post -op Vital signs reviewed and stable  Post vital signs: Reviewed and stable  Last Vitals:  Vitals Value Taken Time  BP 95/46 01/02/22 1041  Temp    Pulse 65 01/02/22 1043  Resp 17 01/02/22 1043  SpO2 96 % 01/02/22 1043  Vitals shown include unvalidated device data.  Last Pain:  Vitals:   01/02/22 0856  TempSrc: Temporal  PainSc: 0-No pain         Complications: No notable events documented.

## 2022-01-02 NOTE — Anesthesia Postprocedure Evaluation (Signed)
Anesthesia Post Note  Patient: Melissa Rhodes  Procedure(s) Performed: COLONOSCOPY WITH PROPOFOL  Patient location during evaluation: PACU Anesthesia Type: General Level of consciousness: awake and awake and alert Pain management: pain level controlled Vital Signs Assessment: post-procedure vital signs reviewed and stable Respiratory status: nonlabored ventilation Cardiovascular status: stable Anesthetic complications: no   No notable events documented.   Last Vitals:  Vitals:   01/02/22 1041 01/02/22 1051  BP: (!) 95/46 (!) 105/51  Pulse:    Resp:    Temp: (!) 35.9 C   SpO2:      Last Pain:  Vitals:   01/02/22 1103  TempSrc:   PainSc: 0-No pain                 VAN STAVEREN,Yovan Leeman

## 2022-01-02 NOTE — H&P (Signed)
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 Recent history of rectal bleeding with stools, only on episode since exam earlier this month. For fu colonoscopy.   Tolerated the prep well,  Reglan prior to prep prevent nausea she had experienced in the past.   Medications Prior to Admission  Medication Sig Dispense Refill Last Dose   anastrozole (ARIMIDEX) 1 MG tablet Take 1 mg by mouth daily.    01/01/2022   aspirin 81 MG tablet Take 81 mg by mouth daily.    01/01/2022   hydrochlorothiazide (HYDRODIURIL) 25 MG tablet TAKE 1 TABLET(25 MG) BY MOUTH DAILY 90 tablet 1 01/01/2022   lisinopril (ZESTRIL) 10 MG tablet TAKE 1 TABLET(10 MG) BY MOUTH DAILY 90 tablet 1 01/02/2022   pantoprazole (PROTONIX) 40 MG tablet TAKE 1 TABLET(40 MG) BY MOUTH DAILY 90 tablet 1 01/01/2022   potassium chloride (MICRO-K) 10 MEQ CR capsule TAKE 2 CAPSULES(20 MEQ) BY MOUTH DAILY 180 capsule 1 01/01/2022   rOPINIRole (REQUIP) 1 MG tablet TAKE 1 TABLET(1 MG) BY MOUTH AT BEDTIME 90 tablet 1 01/01/2022   rosuvastatin (CRESTOR) 20 MG tablet TAKE 1 TABLET(20 MG) BY MOUTH DAILY 90 tablet 1 01/01/2022   fluocinonide cream (LIDEX) 0.05 % Use as directed 30 g 0    Glucosamine-Chondroitin (MOVE FREE PO) Take by mouth.      Multiple Vitamins-Minerals (CENTRUM ULTRA WOMENS PO) Take by mouth.      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   left   Cancer ([place]) 2005   colon   Chronic kidney disease    GERD (gastroesophageal reflux disease)    History of chicken pox    History of kidney stones    Hypertension 2010   Mammographic microcalcification 2013   Melanoma ([place]) 2019   left arm   Mitral valve prolapse 2006   Neoplasm of uncertain behavior of breast 2014   left   Neutropenia ([place])    Obesity, unspecified 2014   Personal history of colonic polyps 2013    Restless leg syndrome    Special screening for malignant neoplasms, colon 2014   Tubal pregnancy    Past Surgical History:  Procedure Laterality Date   BREAST BIOPSY Left 2013   BREAST BIOPSY Right 03/06/2017   LIQ coil clip. FEATURES OF RADIAL SCAR   BREAST SURGERY Left 2013   re excision of left breast showed small area of residual ADH, but she was not upsatged to DCIS or invasive cancer. The margins were clear.  Patient declined tamoxifen therapy   COLONOSCOPY  2006,2014   COLONOSCOPY W/ POLYPECTOMY  2014   COLONOSCOPY WITH PROPOFOL N/A 11/19/2017   Procedure: COLONOSCOPY WITH PROPOFOL;  Surgeon: Robert Bellow, MD;  Location: [facility] ENDOSCOPY;  Service: Endoscopy;  Laterality: N/A;   ECTOPIC PREGNANCY SURGERY  1981   ESOPHAGOGASTRODUODENOSCOPY (EGD) WITH PROPOFOL N/A 11/19/2017   Procedure: ESOPHAGOGASTRODUODENOSCOPY (EGD) WITH PROPOFOL;  Surgeon: Robert Bellow, MD;  Location: [facility] ENDOSCOPY;  Service: Endoscopy;  Laterality: N/A;   kidney stones  2009   SHOULDER SURGERY Right 2007   TUBAL LIGATION  1981   Social History   Socioeconomic History   Marital status: Married    Spouse name: Not on file   Number of children: Not on file   Years of education: Not on file   Highest education  level: Not on file  Occupational History   Not on file  Tobacco Use   Smoking status: Never   Smokeless tobacco: Never  Vaping Use   Vaping Use: Never used  Substance and Sexual Activity   Alcohol use: No   Drug use: No   Sexual activity: Never  Other Topics Concern   Not on file  Social History Narrative   Not on file   Social Determinants of Health   Financial Resource Strain: Low Risk  (06/21/2020)   Overall Financial Resource Strain (CARDIA)    Difficulty of Paying Living Expenses: Not hard at all  Food Insecurity: No Food Insecurity (06/21/2020)   Hunger Vital Sign    Worried About Running Out of Food in the Last Year: Never true    Ran Out of Food in the Last Year: Never  true  Transportation Needs: No Transportation Needs (06/21/2020)   PRAPARE - Hydrologist (Medical): No    Lack of Transportation (Non-Medical): No  Physical Activity: Not on file  Stress: No Stress Concern Present (06/21/2020)   [place]    Feeling of Stress : Not at all  Social Connections: Unknown (06/21/2020)   Social Connection and Isolation Panel [NHANES]    Frequency of Communication with Friends and Family: Not on file    Frequency of Social Gatherings with Friends and Family: Not on file    Attends Religious Services: Not on file    Active Member of Clubs or Organizations: Not on file    Attends Archivist Meetings: Not on file    Marital Status: Married  Intimate Partner Violence: Not At Risk (06/21/2020)   Humiliation, Afraid, Rape, and Kick questionnaire    Fear of Current or Ex-Partner: No    Emotionally Abused: No    Physically Abused: No    Sexually Abused: No   Social History   Social History Narrative   Not on file     ROS: Negative.     PE: HEENT: Negative. Lungs: Clear. Cardio: RR.   Assessment/Plan:  Proceed with planned endoscopy.  Forest Gleason [facility] 01/02/2022

## 2022-01-02 NOTE — Anesthesia Preprocedure Evaluation (Signed)
Anesthesia Evaluation  Patient identified by MRN, date of birth, ID band Patient awake    Reviewed: Allergy & Precautions, NPO status , Patient's Chart, lab work & pertinent test results  Airway Mallampati: III  TM Distance: >3 FB Neck ROM: Full    Dental  (+) Teeth Intact   Pulmonary neg pulmonary ROS,    Pulmonary exam normal breath sounds clear to auscultation       Cardiovascular Exercise Tolerance: Good hypertension, Pt. on medications negative cardio ROS Normal cardiovascular exam Rhythm:Regular Rate:Normal     Neuro/Psych  Headaches, negative neurological ROS  negative psych ROS   GI/Hepatic negative GI ROS, Neg liver ROS, GERD  Medicated,  Endo/Other  negative endocrine ROS  Renal/GU negative Renal ROS  negative genitourinary   Musculoskeletal  (+) Arthritis ,   Abdominal Normal abdominal exam  (+)   Peds negative pediatric ROS (+)  Hematology negative hematology ROS (+)   Anesthesia Other Findings Past Medical History: 2009: Arthritis 10/2011: Breast ductal hyperplasia, atypical     Comment:  left 2005: Cancer ([place])     Comment:  colon No date: Chronic kidney disease No date: GERD (gastroesophageal reflux disease) No date: History of chicken pox No date: History of kidney stones 2010: Hypertension 2013: Mammographic microcalcification 2019: Melanoma ([place])     Comment:  left arm 2006: Mitral valve prolapse 2014: Neoplasm of uncertain behavior of breast     Comment:  left No date: Neutropenia ([place]) 2014: Obesity, unspecified 2013: Personal history of colonic polyps No date: Restless leg syndrome 2014: Special screening for malignant neoplasms, colon No date: Tubal pregnancy  Past Surgical History: 2013: BREAST BIOPSY; Left 03/06/2017: BREAST BIOPSY; Right     Comment:  LIQ coil clip. FEATURES OF RADIAL SCAR 2013: BREAST SURGERY; Left     Comment:  re excision of left breast showed small  area of residual              ADH, but she was not upsatged to DCIS or invasive cancer.              The margins were clear.  Patient declined tamoxifen               therapy 2006,2014: COLONOSCOPY 2014: COLONOSCOPY W/ POLYPECTOMY 11/19/2017: COLONOSCOPY WITH PROPOFOL; N/A     Comment:  Procedure: COLONOSCOPY WITH PROPOFOL;  Surgeon: Robert Bellow, MD;  Location: [facility] ENDOSCOPY;  Service:               Endoscopy;  Laterality: N/A; 1981: ECTOPIC PREGNANCY SURGERY 11/19/2017: ESOPHAGOGASTRODUODENOSCOPY (EGD) WITH PROPOFOL; N/A     Comment:  Procedure: ESOPHAGOGASTRODUODENOSCOPY (EGD) WITH               PROPOFOL;  Surgeon: Robert Bellow, MD;  Location:               [facility] ENDOSCOPY;  Service: Endoscopy;  Laterality: N/A; 2009: kidney stones 2007: SHOULDER SURGERY; Right 1981: TUBAL LIGATION     Reproductive/Obstetrics negative OB ROS                             Anesthesia Physical Anesthesia Plan  ASA: 2  Anesthesia Plan: General   Post-op Pain Management:    Induction: Intravenous  PONV Risk Score and Plan: Propofol infusion and TIVA  Airway Management Planned: Natural Airway  Additional Equipment:  Intra-op Plan:   Post-operative Plan:   Informed Consent: I have reviewed the patients History and Physical, chart, labs and discussed the procedure including the risks, benefits and alternatives for the proposed anesthesia with the patient or authorized representative who has indicated his/her understanding and acceptance.     Dental Advisory Given  Plan Discussed with: CRNA and Surgeon  Anesthesia Plan Comments:         Anesthesia Quick Evaluation

## 2022-01-02 NOTE — Op Note (Signed)
[facility] Gastroenterology Patient Name: Melissa Rhodes Procedure Date: 01/02/2022 10:04 AM MRN: [id number] Account #: [id number] Date of Birth: 08-Sep-1950 Admit Type: Outpatient Age: 71 Room: [facility] ENDO ROOM 1 Gender: Female Note Status: Finalized Instrument Name: Peds Colonoscope [id number] Procedure:             Colonoscopy Indications:           High risk colon cancer surveillance: Personal history                         of colonic polyps, Incidental - Anal bleeding Providers:             Robert Bellow, MD Referring MD:          Einar Pheasant, MD (Referring MD) Medicines:             Propofol per Anesthesia Complications:         No immediate complications. Procedure:             Pre-Anesthesia Assessment:                        - Prior to the procedure, a History and Physical was                         performed, and patient medications, allergies and                         sensitivities were reviewed. The patient's tolerance                         of previous anesthesia was reviewed.                        - The risks and benefits of the procedure and the                         sedation options and risks were discussed with the                         patient. All questions were answered and informed                         consent was obtained.                        After obtaining informed consent, the colonoscope was                         passed under direct vision. Throughout the procedure,                         the patient's blood pressure, pulse, and oxygen                         saturations were monitored continuously. The                         Colonoscope was introduced through the anus and  advanced to the the cecum, identified by appendiceal                         orifice and ileocecal valve. The colonoscopy was                         somewhat difficult due to significant looping.                          Successful completion of the procedure was aided by                         using manual pressure. The patient tolerated the                         procedure well. The quality of the bowel preparation                         was excellent. Findings:      A 5 mm polyp was found in the cecum. The polyp was sessile. The polyp       was removed with a hot snare. Resection was complete, but the polyp       tissue was only partially retrieved. A small fragment of the base was       removed with cold forceps.      The retroflexed view of the distal rectum and anal verge was normal and       showed no anal or rectal abnormalities. Impression:            - One 5 mm polyp in the cecum, removed with a hot                         snare. Complete resection. Partial retrieval.                        - The distal rectum and anal verge are normal on                         retroflexion view. Recommendation:        - Telephone endoscopist for pathology results in 1                         week. Procedure Code(s):     --- Professional ---                        [phone], Colonoscopy, flexible; with removal of                         tumor(s), polyp(s), or other lesion(s) by snare                         technique Diagnosis Code(s):     --- Professional ---                        Z86.010, Personal history of colonic polyps                        K63.5, Polyp of colon  CPT copyright 2019 American Medical Association. All rights reserved. The codes documented in this report are preliminary and upon coder review may  be revised to meet current compliance requirements. Robert Bellow, MD 01/02/2022 10:39:16 AM This report has been signed electronically. Number of Addenda: 0 Note Initiated On: 01/02/2022 10:04 AM Scope Withdrawal Time: 0 hours 12 minutes 31 seconds  Total Procedure Duration: 0 hours 21 minutes 57 seconds  Estimated Blood Loss:  Estimated blood loss: none.      [facility]

## 2022-01-03 ENCOUNTER — Encounter: Payer: Self-pay | Admitting: General Surgery

## 2022-01-03 LAB — SURGICAL PATHOLOGY

## 2022-01-10 ENCOUNTER — Other Ambulatory Visit: Payer: Self-pay | Admitting: Internal Medicine

## 2022-01-14 DIAGNOSIS — N6099 Unspecified benign mammary dysplasia of unspecified breast: Secondary | ICD-10-CM | POA: Diagnosis not present

## 2022-01-14 DIAGNOSIS — Z1231 Encounter for screening mammogram for malignant neoplasm of breast: Secondary | ICD-10-CM | POA: Diagnosis not present

## 2022-01-15 ENCOUNTER — Ambulatory Visit (INDEPENDENT_AMBULATORY_CARE_PROVIDER_SITE_OTHER): Payer: PPO

## 2022-01-15 DIAGNOSIS — E538 Deficiency of other specified B group vitamins: Secondary | ICD-10-CM | POA: Diagnosis not present

## 2022-01-15 MED ORDER — CYANOCOBALAMIN 1000 MCG/ML IJ SOLN
1000.0000 ug | Freq: Once | INTRAMUSCULAR | Status: AC
  Administered 2022-01-15: 1000 ug via INTRAMUSCULAR

## 2022-01-15 NOTE — Progress Notes (Signed)
Pt arrived for B12 injection, given in R deltoid. Pt tolerated injection well, showed no signs of distress nor voiced any concerns.  

## 2022-02-07 ENCOUNTER — Telehealth: Payer: Self-pay | Admitting: Internal Medicine

## 2022-02-07 NOTE — Telephone Encounter (Signed)
Copied from [place] [id number]. Topic: Medicare AWV >> Feb 07, 2022  9:53 AM Devoria Glassing wrote: Reason for CRM: Left message for patient to schedule Annual Wellness Visit.  Please schedule with Nurse Health Advisor Denisa O'Brien-Blaney, LPN at [facility]. This appt can be telephone or office visit.  Please call [phone] ask for [facility]

## 2022-02-15 ENCOUNTER — Ambulatory Visit (INDEPENDENT_AMBULATORY_CARE_PROVIDER_SITE_OTHER): Payer: PPO | Admitting: *Deleted

## 2022-02-15 DIAGNOSIS — E538 Deficiency of other specified B group vitamins: Secondary | ICD-10-CM

## 2022-02-15 MED ORDER — CYANOCOBALAMIN 1000 MCG/ML IJ SOLN
1000.0000 ug | Freq: Once | INTRAMUSCULAR | Status: AC
  Administered 2022-02-15: 1000 ug via INTRAMUSCULAR

## 2022-02-15 NOTE — Progress Notes (Signed)
Pt received B12 injection in left deltoid. Pt had no concerns or complaints

## 2022-03-19 ENCOUNTER — Ambulatory Visit (INDEPENDENT_AMBULATORY_CARE_PROVIDER_SITE_OTHER): Payer: PPO

## 2022-03-19 DIAGNOSIS — E538 Deficiency of other specified B group vitamins: Secondary | ICD-10-CM | POA: Diagnosis not present

## 2022-03-19 MED ORDER — CYANOCOBALAMIN 1000 MCG/ML IJ SOLN
1000.0000 ug | Freq: Once | INTRAMUSCULAR | Status: AC
  Administered 2022-03-19: 1000 ug via INTRAMUSCULAR

## 2022-03-19 NOTE — Progress Notes (Signed)
Pt arrived for B12 injection, given in R deltoid. Pt tolerated injection well, showed no signs of distress nor voiced any concerns.  

## 2022-03-21 ENCOUNTER — Telehealth: Payer: Self-pay | Admitting: Internal Medicine

## 2022-03-21 NOTE — Telephone Encounter (Signed)
Copied from [place] [id number]. Topic: Medicare AWV >> Mar 21, 2022  3:03 PM Jae Dire wrote: Reason for CRM:  Left message for patient to call back and schedule Medicare Annual Wellness Visit (AWV) in office.   If unable to come into the office for AWV,  please offer to do virtually or by telephone.  Last AWV:  06/21/2020  Please schedule at anytime with [place].  30 minute appointment for Virtual or phone 45 minute appointment for in office or Initial virtual/phone  Any questions, please contact me at [phone]

## 2022-04-03 ENCOUNTER — Telehealth: Payer: Self-pay | Admitting: Internal Medicine

## 2022-04-03 NOTE — Telephone Encounter (Unsigned)
Copied from [place] [id number]. Topic: Medicare AWV >> Apr 03, 2022  2:12 PM Devoria Glassing wrote: Reason for CRM: Left message for patient to schedule Annual Wellness Visit.  Please schedule with Nurse Health Advisor Denisa O'Brien-Blaney, LPN at [facility]. This appt can be telephone or office visit.  Please call [phone] ask for [facility]

## 2022-04-06 ENCOUNTER — Other Ambulatory Visit: Payer: Self-pay | Admitting: Internal Medicine

## 2022-04-16 ENCOUNTER — Ambulatory Visit (INDEPENDENT_AMBULATORY_CARE_PROVIDER_SITE_OTHER): Payer: PPO | Admitting: Internal Medicine

## 2022-04-16 ENCOUNTER — Encounter: Payer: Self-pay | Admitting: Internal Medicine

## 2022-04-16 VITALS — BP 134/80 | HR 54 | Ht 64.0 in | Wt 184.8 lb

## 2022-04-16 DIAGNOSIS — N6099 Unspecified benign mammary dysplasia of unspecified breast: Secondary | ICD-10-CM

## 2022-04-16 DIAGNOSIS — E7800 Pure hypercholesterolemia, unspecified: Secondary | ICD-10-CM | POA: Diagnosis not present

## 2022-04-16 DIAGNOSIS — E538 Deficiency of other specified B group vitamins: Secondary | ICD-10-CM | POA: Diagnosis not present

## 2022-04-16 DIAGNOSIS — R739 Hyperglycemia, unspecified: Secondary | ICD-10-CM | POA: Diagnosis not present

## 2022-04-16 DIAGNOSIS — Z86010 Personal history of colonic polyps: Secondary | ICD-10-CM | POA: Diagnosis not present

## 2022-04-16 DIAGNOSIS — D696 Thrombocytopenia, unspecified: Secondary | ICD-10-CM

## 2022-04-16 DIAGNOSIS — L4050 Arthropathic psoriasis, unspecified: Secondary | ICD-10-CM

## 2022-04-16 DIAGNOSIS — Z0000 Encounter for general adult medical examination without abnormal findings: Secondary | ICD-10-CM

## 2022-04-16 DIAGNOSIS — K625 Hemorrhage of anus and rectum: Secondary | ICD-10-CM

## 2022-04-16 DIAGNOSIS — Z85820 Personal history of malignant melanoma of skin: Secondary | ICD-10-CM

## 2022-04-16 DIAGNOSIS — K219 Gastro-esophageal reflux disease without esophagitis: Secondary | ICD-10-CM | POA: Diagnosis not present

## 2022-04-16 DIAGNOSIS — I10 Essential (primary) hypertension: Secondary | ICD-10-CM | POA: Diagnosis not present

## 2022-04-16 DIAGNOSIS — Z23 Encounter for immunization: Secondary | ICD-10-CM | POA: Diagnosis not present

## 2022-04-16 LAB — CBC WITH DIFFERENTIAL/PLATELET
Basophils Absolute: 0 10*3/uL (ref 0.0–0.1)
Basophils Relative: 0.4 % (ref 0.0–3.0)
Eosinophils Absolute: 0.1 10*3/uL (ref 0.0–0.7)
Eosinophils Relative: 2.6 % (ref 0.0–5.0)
HCT: 40.3 % (ref 36.0–46.0)
Hemoglobin: 13.5 g/dL (ref 12.0–15.0)
Lymphocytes Relative: 23.7 % (ref 12.0–46.0)
Lymphs Abs: 0.9 10*3/uL (ref 0.7–4.0)
MCHC: 33.6 g/dL (ref 30.0–36.0)
MCV: 87.2 fl (ref 78.0–100.0)
Monocytes Absolute: 0.4 10*3/uL (ref 0.1–1.0)
Monocytes Relative: 8.9 % (ref 3.0–12.0)
Neutro Abs: 2.5 10*3/uL (ref 1.4–7.7)
Neutrophils Relative %: 64.4 % (ref 43.0–77.0)
Platelets: 167 10*3/uL (ref 150.0–400.0)
RBC: 4.61 Mil/uL (ref 3.87–5.11)
RDW: 14.6 % (ref 11.5–15.5)
WBC: 3.9 10*3/uL — ABNORMAL LOW (ref 4.0–10.5)

## 2022-04-16 LAB — LIPID PANEL
Cholesterol: 143 mg/dL (ref 0–200)
HDL: 63.6 mg/dL (ref >39.00)
LDL Cholesterol: 67 mg/dL (ref 0–99)
NonHDL: 79.18
Total CHOL/HDL Ratio: 2
Triglycerides: 61 mg/dL (ref 0.0–149.0)
VLDL: 12.2 mg/dL (ref 0.0–40.0)

## 2022-04-16 LAB — BASIC METABOLIC PANEL
BUN: 20 mg/dL (ref 6–23)
CO2: 31 mEq/L (ref 19–32)
Calcium: 9.2 mg/dL (ref 8.4–10.5)
Chloride: 106 mEq/L (ref 96–112)
Creatinine, Ser: 0.68 mg/dL (ref 0.40–1.20)
GFR: 87.38 mL/min (ref >60.00)
Glucose, Bld: 96 mg/dL (ref 70–99)
Potassium: 4.2 mEq/L (ref 3.5–5.1)
Sodium: 142 mEq/L (ref 135–145)

## 2022-04-16 LAB — HEMOGLOBIN A1C: Hgb A1c MFr Bld: 6 % (ref 4.6–6.5)

## 2022-04-16 LAB — HEPATIC FUNCTION PANEL
ALT: 17 U/L (ref 0–35)
AST: 18 U/L (ref 0–37)
Albumin: 4.1 g/dL (ref 3.5–5.2)
Alkaline Phosphatase: 66 U/L (ref 39–117)
Bilirubin, Direct: 0.1 mg/dL (ref 0.0–0.3)
Total Bilirubin: 0.5 mg/dL (ref 0.2–1.2)
Total Protein: 5.9 g/dL — ABNORMAL LOW (ref 6.0–8.3)

## 2022-04-16 MED ORDER — CYANOCOBALAMIN 1000 MCG/ML IJ SOLN
1000.0000 ug | Freq: Once | INTRAMUSCULAR | Status: AC
  Administered 2022-04-16: 1000 ug via INTRAMUSCULAR

## 2022-04-16 NOTE — Assessment & Plan Note (Signed)
No upper symptoms reported.  On protonix.   

## 2022-04-16 NOTE — Assessment & Plan Note (Signed)
Followed by dermatology

## 2022-04-16 NOTE — Assessment & Plan Note (Signed)
Saw Dr Bary Castilla - rectal bleeding.  Felt likely - small posterior fissure. Colonoscopy 01/02/22. Has had not further bleeding.  Bowels moving.

## 2022-04-16 NOTE — Assessment & Plan Note (Signed)
Low carb diet and exercise.  Follow met b and a1c.   

## 2022-04-16 NOTE — Assessment & Plan Note (Signed)
Has a history of a right breast atypical ductal hyperplasia. She is currently on anastrozole for chemoprevention since 2019. Seeing oncology - last 01/14/22.  Recommended one more year aromatase inhibitor.

## 2022-04-16 NOTE — Assessment & Plan Note (Signed)
Colonoscopy 12/2021 - one 71m polyp in the cecum- tubular adenoma

## 2022-04-16 NOTE — Assessment & Plan Note (Signed)
Follow cbc.  

## 2022-04-16 NOTE — Assessment & Plan Note (Addendum)
Physical today 04/16/22.  Colonoscopy 12/2021 - one 58m cecal polyp.  Mammogram 07/23/21 - Birads II.

## 2022-04-16 NOTE — Assessment & Plan Note (Signed)
Evaluated by rheumatology.  Desires not to take humira.  Follow.

## 2022-04-16 NOTE — Progress Notes (Signed)
Patient ID: Melissa Rhodes, female   DOB: 10-13-1950, 71 y.o.   MRN: [id number]   Subjective:    Patient ID: Melissa Rhodes, female    DOB: 1951-05-03, 71 y.o.   MRN: [id number]   Patient here for  Chief Complaint  Patient presents with   Annual Exam    cpe   .   HPI Here for physical. Has a history of a right breast atypical ductal hyperplasia. She is currently on anastrozole for chemoprevention since 2019. Seeing oncology - last 01/14/22.  Recommended one more year aromatase inhibitor. Saw Dr Bary Castilla - rectal bleeding.  Felt likely - small posterior fissure. Colonoscopy 01/02/22. Has had not further bleeding.  Bowels moving.  No bowel issues.  No chest pain or sob reported.  No cough or congestion.  No abdominal pain.  Walking on treadmill.    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   left   Cancer [facility]) 2005   colon   Chronic kidney disease    GERD (gastroesophageal reflux disease)    History of chicken pox    History of kidney stones    Hypertension 2010   Mammographic microcalcification 2013   Melanoma ([place]) 2019   left arm   Mitral valve prolapse 2006   Neoplasm of uncertain behavior of breast 2014   left   Neutropenia ([place])    Obesity, unspecified 2014   Personal history of colonic polyps 2013   Restless leg syndrome    Special screening for malignant neoplasms, colon 2014   Tubal pregnancy    Past Surgical History:  Procedure Laterality Date   BREAST BIOPSY Left 2013   BREAST BIOPSY Right 03/06/2017   LIQ coil clip. FEATURES OF RADIAL SCAR   BREAST SURGERY Left 2013   re excision of left breast showed small area of residual ADH, but she was not upsatged to DCIS or invasive cancer. The margins were clear.  Patient declined tamoxifen therapy   COLONOSCOPY  2006,2014   COLONOSCOPY W/ POLYPECTOMY  2014   COLONOSCOPY WITH PROPOFOL N/A 11/19/2017   Procedure: COLONOSCOPY WITH PROPOFOL;  Surgeon:  Robert Bellow, MD;  Location: [facility] ENDOSCOPY;  Service: Endoscopy;  Laterality: N/A;   COLONOSCOPY WITH PROPOFOL N/A 01/02/2022   Procedure: COLONOSCOPY WITH PROPOFOL;  Surgeon: Robert Bellow, MD;  Location: [facility] ENDOSCOPY;  Service: Endoscopy;  Laterality: N/A;   ECTOPIC PREGNANCY SURGERY  1981   ESOPHAGOGASTRODUODENOSCOPY (EGD) WITH PROPOFOL N/A 11/19/2017   Procedure: ESOPHAGOGASTRODUODENOSCOPY (EGD) WITH PROPOFOL;  Surgeon: Robert Bellow, MD;  Location: [facility] ENDOSCOPY;  Service: Endoscopy;  Laterality: N/A;   kidney stones  2009   SHOULDER SURGERY Right 2007   TUBAL LIGATION  1981   Family History  Problem Relation Age of Onset   Cancer Father 27       lung   Early death Father    Arthritis Mother    Depression Mother    Diabetes Mother    Heart disease Mother    Hypertension Mother    Hyperlipidemia Mother    Stroke Mother    Depression Sister    Diabetes Sister    Hyperlipidemia Sister    Hypertension Sister    Depression Sister    Hyperlipidemia Sister    Hypertension Sister    91 / Stillbirths Sister    Depression Sister    Hyperlipidemia Sister    Hypertension Sister    Depression Brother  Diabetes Brother    Hyperlipidemia Brother    Hypertension Brother    Stroke Brother    Breast cancer Neg Hx    Ovarian cancer Neg Hx    Social History   Socioeconomic History   Marital status: Married    Spouse name: Not on file   Number of children: Not on file   Years of education: Not on file   Highest education level: Not on file  Occupational History   Not on file  Tobacco Use   Smoking status: Never   Smokeless tobacco: Never  Vaping Use   Vaping Use: Never used  Substance and Sexual Activity   Alcohol use: No   Drug use: No   Sexual activity: Never  Other Topics Concern   Not on file  Social History Narrative   Not on file   Social Determinants of Health   Financial Resource Strain: Low Risk  (06/21/2020)   Overall  Financial Resource Strain (CARDIA)    Difficulty of Paying Living Expenses: Not hard at all  Food Insecurity: No Food Insecurity (06/21/2020)   Hunger Vital Sign    Worried About Running Out of Food in the Last Year: Never true    [place] in the Last Year: Never true  Transportation Needs: No Transportation Needs (06/21/2020)   PRAPARE - Hydrologist (Medical): No    Lack of Transportation (Non-Medical): No  Physical Activity: Not on file  Stress: No Stress Concern Present (06/21/2020)   [place]    Feeling of Stress : Not at all  Social Connections: Unknown (06/21/2020)   Social Connection and Isolation Panel [NHANES]    Frequency of Communication with Friends and Family: Not on file    Frequency of Social Gatherings with Friends and Family: Not on file    Attends Religious Services: Not on file    Active Member of Clubs or Organizations: Not on file    Attends Archivist Meetings: Not on file    Marital Status: Married     Review of Systems  Constitutional:  Negative for appetite change and unexpected weight change.  HENT:  Negative for congestion, sinus pressure and sore throat.   Eyes:  Negative for pain and visual disturbance.  Respiratory:  Negative for cough, chest tightness and shortness of breath.   Cardiovascular:  Negative for chest pain, palpitations and leg swelling.  Gastrointestinal:  Negative for abdominal pain, diarrhea, nausea and vomiting.  Genitourinary:  Negative for difficulty urinating and dysuria.  Musculoskeletal:  Negative for joint swelling and myalgias.  Skin:  Negative for color change and rash.  Neurological:  Negative for dizziness, light-headedness and headaches.  Hematological:  Negative for adenopathy. Does not bruise/bleed easily.  Psychiatric/Behavioral:  Negative for agitation and dysphoric mood.        Objective:     BP 134/80    Pulse (!) 54   Ht _0  (1.626 m)   Wt 184 lb 12.8 oz (83.8 kg)   SpO2 96%   BMI 31.72 kg/m  Wt Readings from Last 3 Encounters:  04/16/22 184 lb 12.8 oz (83.8 kg)  12/12/21 187 lb 9.6 oz (85.1 kg)  07/10/21 177 lb 6.4 oz (80.5 kg)    Physical Exam Vitals reviewed.  Constitutional:      General: She is not in acute distress.    Appearance: Normal appearance. She is well-developed.  HENT:  Head: Normocephalic and atraumatic.     Right Ear: External ear normal.     Left Ear: External ear normal.  Eyes:     General: No scleral icterus.       Right eye: No discharge.        Left eye: No discharge.     Conjunctiva/sclera: Conjunctivae normal.  Neck:     Thyroid: No thyromegaly.  Cardiovascular:     Rate and Rhythm: Normal rate and regular rhythm.  Pulmonary:     Effort: No tachypnea, accessory muscle usage or respiratory distress.     Breath sounds: Normal breath sounds. No decreased breath sounds or wheezing.  Chest:  Breasts:    Right: No inverted nipple, mass, nipple discharge or tenderness (no axillary adenopathy).     Left: No inverted nipple, mass, nipple discharge or tenderness (no axilarry adenopathy).  Abdominal:     General: Bowel sounds are normal.     Palpations: Abdomen is soft.     Tenderness: There is no abdominal tenderness.  Musculoskeletal:        General: No swelling or tenderness.     Cervical back: Neck supple.  Lymphadenopathy:     Cervical: No cervical adenopathy.  Skin:    Findings: No erythema or rash.  Neurological:     Mental Status: She is alert and oriented to person, place, and time.  Psychiatric:        Mood and Affect: Mood normal.        Behavior: Behavior normal.      Outpatient Encounter Medications as of 04/16/2022  Medication Sig   anastrozole (ARIMIDEX) 1 MG tablet Take 1 mg by mouth daily.    aspirin 81 MG tablet Take 81 mg by mouth daily.    fluocinonide cream (LIDEX) 0.05 % Use as directed   Glucosamine-Chondroitin  (MOVE FREE PO) Take by mouth.   hydrochlorothiazide (HYDRODIURIL) 25 MG tablet TAKE 1 TABLET(25 MG) BY MOUTH DAILY   lisinopril (ZESTRIL) 10 MG tablet TAKE 1 TABLET(10 MG) BY MOUTH DAILY   Multiple Vitamins-Minerals (CENTRUM ULTRA WOMENS PO) Take by mouth.   pantoprazole (PROTONIX) 40 MG tablet TAKE 1 TABLET(40 MG) BY MOUTH DAILY   potassium chloride (MICRO-K) 10 MEQ CR capsule TAKE 2 CAPSULES(20 MEQ) BY MOUTH DAILY   rOPINIRole (REQUIP) 1 MG tablet TAKE 1 TABLET(1 MG) BY MOUTH AT BEDTIME   rosuvastatin (CRESTOR) 20 MG tablet TAKE 1 TABLET(20 MG) BY MOUTH DAILY   [EXPIRED] cyanocobalamin (VITAMIN B12) injection 1,000 mcg    No facility-administered encounter medications on file as of 04/16/2022.     Lab Results  Component Value Date   WBC 3.9 (L) 04/16/2022   HGB 13.5 04/16/2022   HCT 40.3 04/16/2022   PLT 167.0 04/16/2022   GLUCOSE 96 04/16/2022   CHOL 143 04/16/2022   TRIG 61.0 04/16/2022   HDL 63.60 04/16/2022   LDLCALC 67 04/16/2022   ALT 17 04/16/2022   AST 18 04/16/2022   NA 142 04/16/2022   K 4.2 04/16/2022   CL 106 04/16/2022   CREATININE 0.68 04/16/2022   BUN 20 04/16/2022   CO2 31 04/16/2022   TSH 3.41 12/12/2021   HGBA1C 6.0 04/16/2022       Assessment & Plan:   Problem List Items Addressed This Visit     Atypical ductal hyperplasia of breast    Has a history of a right breast atypical ductal hyperplasia. She is currently on anastrozole for chemoprevention since 2019. Seeing oncology - last 01/14/22.  Recommended one more year aromatase inhibitor.      GERD (gastroesophageal reflux disease)    No upper symptoms reported.  On protonix.        Healthcare maintenance    Physical today 04/16/22.  Colonoscopy 12/2021 - one 45m cecal polyp.  Mammogram 07/23/21 - Birads II.       History of colon polyps    Colonoscopy 12/2021 - one 550mpolyp in the cecum- tubular adenoma      History of melanoma    Followed by dermatology.       Hypercholesterolemia - Primary     On crestor.  Low cholesterol diet and exercise.  Follow lipid panel and liver function tests.        Relevant Orders   Hepatic function panel (Completed)   Lipid panel (Completed)   Hyperglycemia    Low carb diet and exercise.  Follow met b and a1c.        Relevant Orders   Hemoglobin A1c (Completed)   Hypertension    Blood pressure as outlined. Oon lisinopril and hctz.  Follow pressures.  Follow metabolic panel.       Relevant Orders   CBC with Differential/Platelet (Completed)   Basic metabolic panel (Completed)   [place]   Evaluated by rheumatology.  Desires not to take humira.  Follow.       Rectal bleeding     Saw Dr ByBary Castilla rectal bleeding.  Felt likely - small posterior fissure. Colonoscopy 01/02/22. Has had not further bleeding.  Bowels moving.       [place]rombocyt[place] ([place]   Follow cbc.       Other Visit Diagnoses     Need for influenza vaccination       Relevant Orders   Flu Vaccine QUAD High Dose(Fluad) (Completed)   B12 deficiency       Relevant Medications   cyanocobalamin (VITAMIN B12) injection 1,000 mcg (Completed)        ChEinar PheasantMD

## 2022-04-16 NOTE — Assessment & Plan Note (Signed)
On crestor.  Low cholesterol diet and exercise.  Follow lipid panel and liver function tests.   

## 2022-04-16 NOTE — Assessment & Plan Note (Signed)
Blood pressure as outlined. Oon lisinopril and hctz.  Follow pressures.  Follow metabolic panel.

## 2022-04-17 ENCOUNTER — Telehealth: Payer: Self-pay

## 2022-04-17 NOTE — Telephone Encounter (Signed)
LM FOR PT TO CB RE:  White blood cell count stable.  Platelet count wnl. Cholesterol levels look good.  Overall sugar control relatively stable from last check.  Continue low carb diet and exercise.  Kidney function tests and liver function tests are wnl.

## 2022-04-17 NOTE — Telephone Encounter (Signed)
Patient returned office phone call, lab results given.

## 2022-04-25 DIAGNOSIS — Z85820 Personal history of malignant melanoma of skin: Secondary | ICD-10-CM | POA: Diagnosis not present

## 2022-04-25 DIAGNOSIS — D2271 Melanocytic nevi of right lower limb, including hip: Secondary | ICD-10-CM | POA: Diagnosis not present

## 2022-04-25 DIAGNOSIS — D2262 Melanocytic nevi of left upper limb, including shoulder: Secondary | ICD-10-CM | POA: Diagnosis not present

## 2022-04-25 DIAGNOSIS — D2261 Melanocytic nevi of right upper limb, including shoulder: Secondary | ICD-10-CM | POA: Diagnosis not present

## 2022-04-25 DIAGNOSIS — D225 Melanocytic nevi of trunk: Secondary | ICD-10-CM | POA: Diagnosis not present

## 2022-04-25 DIAGNOSIS — Z1283 Encounter for screening for malignant neoplasm of skin: Secondary | ICD-10-CM | POA: Diagnosis not present

## 2022-05-02 ENCOUNTER — Other Ambulatory Visit: Payer: Self-pay | Admitting: Internal Medicine

## 2022-05-16 ENCOUNTER — Ambulatory Visit (INDEPENDENT_AMBULATORY_CARE_PROVIDER_SITE_OTHER): Payer: PPO

## 2022-05-16 DIAGNOSIS — E538 Deficiency of other specified B group vitamins: Secondary | ICD-10-CM

## 2022-05-16 MED ORDER — CYANOCOBALAMIN 1000 MCG/ML IJ SOLN
1000.0000 ug | Freq: Once | INTRAMUSCULAR | Status: AC
  Administered 2022-05-16: 1000 ug via INTRAMUSCULAR

## 2022-05-16 NOTE — Progress Notes (Signed)
Patient presented for B 12 injection to left deltoid, patient voiced no concerns nor showed any signs of distress during injection. 

## 2022-06-18 ENCOUNTER — Other Ambulatory Visit: Payer: Self-pay | Admitting: Internal Medicine

## 2022-06-18 ENCOUNTER — Ambulatory Visit: Payer: PPO

## 2022-06-19 MED ORDER — FLUOCINONIDE 0.05 % EX CREA: TOPICAL_CREAM | CUTANEOUS | 0 refills | Status: AC

## 2022-06-19 NOTE — Telephone Encounter (Signed)
Patient comment: You guys sent in a refill in November. For some reason [facility] didn't fill it. They say you will need to send another. Thank you!

## 2022-06-20 ENCOUNTER — Ambulatory Visit (INDEPENDENT_AMBULATORY_CARE_PROVIDER_SITE_OTHER): Payer: PPO

## 2022-06-20 DIAGNOSIS — E538 Deficiency of other specified B group vitamins: Secondary | ICD-10-CM

## 2022-06-20 MED ORDER — CYANOCOBALAMIN 1000 MCG/ML IJ SOLN
1000.0000 ug | Freq: Once | INTRAMUSCULAR | Status: AC
  Administered 2022-06-20: 1000 ug via INTRAMUSCULAR

## 2022-06-20 NOTE — Progress Notes (Signed)
Patient presented for B 12 injection to right deltoid, patient voiced no concerns nor showed any signs of distress during injection. 

## 2022-07-17 ENCOUNTER — Other Ambulatory Visit: Payer: Self-pay | Admitting: Internal Medicine

## 2022-07-22 ENCOUNTER — Ambulatory Visit: Payer: PPO

## 2022-08-02 ENCOUNTER — Telehealth: Payer: Self-pay | Admitting: Internal Medicine

## 2022-08-02 NOTE — Telephone Encounter (Signed)
Called patient to schedule Medicare Annual Wellness Visit (AWV). Left message for patient to call back and schedule Medicare Annual Wellness Visit (AWV).  Last date of AWV: 06/21/2020   Please schedule an appointment at any time with Denisa,NHA.  If any questions, please contact me at [phone].    Thank you,  [place] Direct dial  [phone]

## 2022-08-16 ENCOUNTER — Ambulatory Visit (INDEPENDENT_AMBULATORY_CARE_PROVIDER_SITE_OTHER): Payer: PPO | Admitting: Internal Medicine

## 2022-08-16 ENCOUNTER — Encounter: Payer: Self-pay | Admitting: Internal Medicine

## 2022-08-16 VITALS — BP 136/74 | HR 68 | Temp 98.2°F | Resp 16 | Ht 64.0 in | Wt 189.0 lb

## 2022-08-16 DIAGNOSIS — Z85820 Personal history of malignant melanoma of skin: Secondary | ICD-10-CM

## 2022-08-16 DIAGNOSIS — D696 Thrombocytopenia, unspecified: Secondary | ICD-10-CM | POA: Diagnosis not present

## 2022-08-16 DIAGNOSIS — D72819 Decreased white blood cell count, unspecified: Secondary | ICD-10-CM | POA: Diagnosis not present

## 2022-08-16 DIAGNOSIS — K219 Gastro-esophageal reflux disease without esophagitis: Secondary | ICD-10-CM

## 2022-08-16 DIAGNOSIS — Z86010 Personal history of colonic polyps: Secondary | ICD-10-CM | POA: Diagnosis not present

## 2022-08-16 DIAGNOSIS — R739 Hyperglycemia, unspecified: Secondary | ICD-10-CM | POA: Diagnosis not present

## 2022-08-16 DIAGNOSIS — E7800 Pure hypercholesterolemia, unspecified: Secondary | ICD-10-CM

## 2022-08-16 DIAGNOSIS — N6099 Unspecified benign mammary dysplasia of unspecified breast: Secondary | ICD-10-CM | POA: Diagnosis not present

## 2022-08-16 DIAGNOSIS — L4050 Arthropathic psoriasis, unspecified: Secondary | ICD-10-CM

## 2022-08-16 DIAGNOSIS — I10 Essential (primary) hypertension: Secondary | ICD-10-CM

## 2022-08-16 LAB — HEPATIC FUNCTION PANEL
ALT: 15 U/L (ref 0–35)
AST: 17 U/L (ref 0–37)
Albumin: 3.7 g/dL (ref 3.5–5.2)
Alkaline Phosphatase: 63 U/L (ref 39–117)
Bilirubin, Direct: 0.1 mg/dL (ref 0.0–0.3)
Total Bilirubin: 0.4 mg/dL (ref 0.2–1.2)
Total Protein: 5.8 g/dL — ABNORMAL LOW (ref 6.0–8.3)

## 2022-08-16 LAB — LIPID PANEL
Cholesterol: 138 mg/dL (ref 0–200)
HDL: 57.5 mg/dL (ref >39.00)
LDL Cholesterol: 68 mg/dL (ref 0–99)
NonHDL: 80.06
Total CHOL/HDL Ratio: 2
Triglycerides: 58 mg/dL (ref 0.0–149.0)
VLDL: 11.6 mg/dL (ref 0.0–40.0)

## 2022-08-16 LAB — CBC WITH DIFFERENTIAL/PLATELET
Basophils Absolute: 0 10*3/uL (ref 0.0–0.1)
Basophils Relative: 0.4 % (ref 0.0–3.0)
Eosinophils Absolute: 0.1 10*3/uL (ref 0.0–0.7)
Eosinophils Relative: 2.9 % (ref 0.0–5.0)
HCT: 38.9 % (ref 36.0–46.0)
Hemoglobin: 13.2 g/dL (ref 12.0–15.0)
Lymphocytes Relative: 25.3 % (ref 12.0–46.0)
Lymphs Abs: 0.9 10*3/uL (ref 0.7–4.0)
MCHC: 33.9 g/dL (ref 30.0–36.0)
MCV: 86.4 fl (ref 78.0–100.0)
Monocytes Absolute: 0.3 10*3/uL (ref 0.1–1.0)
Monocytes Relative: 8.8 % (ref 3.0–12.0)
Neutro Abs: 2.2 10*3/uL (ref 1.4–7.7)
Neutrophils Relative %: 62.6 % (ref 43.0–77.0)
Platelets: 164 10*3/uL (ref 150.0–400.0)
RBC: 4.5 Mil/uL (ref 3.87–5.11)
RDW: 14.1 % (ref 11.5–15.5)
WBC: 3.4 10*3/uL — ABNORMAL LOW (ref 4.0–10.5)

## 2022-08-16 LAB — BASIC METABOLIC PANEL
BUN: 19 mg/dL (ref 6–23)
CO2: 30 mEq/L (ref 19–32)
Calcium: 9.3 mg/dL (ref 8.4–10.5)
Chloride: 108 mEq/L (ref 96–112)
Creatinine, Ser: 0.71 mg/dL (ref 0.40–1.20)
GFR: 85.11 mL/min (ref >60.00)
Glucose, Bld: 91 mg/dL (ref 70–99)
Potassium: 4.2 mEq/L (ref 3.5–5.1)
Sodium: 143 mEq/L (ref 135–145)

## 2022-08-16 LAB — HEMOGLOBIN A1C: Hgb A1c MFr Bld: 5.9 % (ref 4.6–6.5)

## 2022-08-16 MED ORDER — HYDROCHLOROTHIAZIDE 25 MG PO TABS
25.0000 mg | ORAL_TABLET | Freq: Every day | ORAL | 3 refills | Status: DC
Start: 2022-08-16 — End: 2022-10-29

## 2022-08-16 MED ORDER — ROSUVASTATIN CALCIUM 20 MG PO TABS: 20.0000 mg | ORAL_TABLET | Freq: Every day | ORAL | 3 refills | Status: DC

## 2022-08-16 MED ORDER — ROPINIROLE HCL 1 MG PO TABS: ORAL_TABLET | ORAL | 3 refills | Status: DC

## 2022-08-16 MED ORDER — LISINOPRIL 10 MG PO TABS: 10.0000 mg | ORAL_TABLET | Freq: Every day | ORAL | 1 refills | Status: DC

## 2022-08-16 MED ORDER — PANTOPRAZOLE SODIUM 40 MG PO TBEC: DELAYED_RELEASE_TABLET | ORAL | 3 refills | Status: DC

## 2022-08-16 MED ORDER — POTASSIUM CHLORIDE ER 10 MEQ PO CPCR: ORAL_CAPSULE | ORAL | 3 refills | Status: DC

## 2022-08-16 NOTE — Patient Instructions (Signed)
Your medicare annual wellness visit is due. Please schedule this at check out.

## 2022-08-16 NOTE — Progress Notes (Signed)
Subjective:    Patient ID: Melissa Rhodes, female    DOB: May 03, 1951, 72 y.o.   MRN: [id number]  Patient here for  Chief Complaint  Patient presents with   Medical Management of Chronic Issues    HPI Here to follow up regarding hypercholesterolemia, hyperglycemia and hypertension.  Has a history of a right breast atypical ductal hyperplasia. She is currently on anastrozole for chemoprevention since 2019. Seeing oncology - last 01/14/22.  Recommended one more year aromatase inhibitor. She is doing well. Feels good.  Trying to stay active.  No chest pain or sob reported.  No abdominal pain or bowel change reported.     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   left   Cancer [facility]) 2005   colon   Chronic kidney disease    GERD (gastroesophageal reflux disease)    History of chicken pox    History of kidney stones    Hypertension 2010   Mammographic microcalcification 2013   Melanoma ([place]) 2019   left arm   Mitral valve prolapse 2006   Neoplasm of uncertain behavior of breast 2014   left   Neutropenia ([place])    Obesity, unspecified 2014   Personal history of colonic polyps 2013   Restless leg syndrome    Special screening for malignant neoplasms, colon 2014   Tubal pregnancy    Past Surgical History:  Procedure Laterality Date   BREAST BIOPSY Left 2013   BREAST BIOPSY Right 03/06/2017   LIQ coil clip. FEATURES OF RADIAL SCAR   BREAST SURGERY Left 2013   re excision of left breast showed small area of residual ADH, but she was not upsatged to DCIS or invasive cancer. The margins were clear.  Patient declined tamoxifen therapy   COLONOSCOPY  2006,2014   COLONOSCOPY W/ POLYPECTOMY  2014   COLONOSCOPY WITH PROPOFOL N/A 11/19/2017   Procedure: COLONOSCOPY WITH PROPOFOL;  Surgeon: Robert Bellow, MD;  Location: [facility] ENDOSCOPY;  Service: Endoscopy;  Laterality: N/A;   COLONOSCOPY WITH PROPOFOL N/A 01/02/2022    Procedure: COLONOSCOPY WITH PROPOFOL;  Surgeon: Robert Bellow, MD;  Location: [facility] ENDOSCOPY;  Service: Endoscopy;  Laterality: N/A;   ECTOPIC PREGNANCY SURGERY  1981   ESOPHAGOGASTRODUODENOSCOPY (EGD) WITH PROPOFOL N/A 11/19/2017   Procedure: ESOPHAGOGASTRODUODENOSCOPY (EGD) WITH PROPOFOL;  Surgeon: Robert Bellow, MD;  Location: [facility] ENDOSCOPY;  Service: Endoscopy;  Laterality: N/A;   kidney stones  2009   SHOULDER SURGERY Right 2007   TUBAL LIGATION  1981   Family History  Problem Relation Age of Onset   Cancer Father 29       lung   Early death Father    Arthritis Mother    Depression Mother    Diabetes Mother    Heart disease Mother    Hypertension Mother    Hyperlipidemia Mother    Stroke Mother    Depression Sister    Diabetes Sister    Hyperlipidemia Sister    Hypertension Sister    Depression Sister    Hyperlipidemia Sister    Hypertension Sister    Miscarriages / 34 Sister    Depression Sister    Hyperlipidemia Sister    Hypertension Sister    Depression Brother    Diabetes Brother    Hyperlipidemia Brother    Hypertension Brother    Stroke Brother    Breast cancer Neg Hx    Ovarian cancer Neg Hx  Social History   Socioeconomic History   Marital status: Married    Spouse name: Not on file   Number of children: Not on file   Years of education: Not on file   Highest education level: Not on file  Occupational History   Not on file  Tobacco Use   Smoking status: Never   Smokeless tobacco: Never  Vaping Use   Vaping Use: Never used  Substance and Sexual Activity   Alcohol use: No   Drug use: No   Sexual activity: Never  Other Topics Concern   Not on file  Social History Narrative   Not on file   Social Determinants of Health   Financial Resource Strain: Low Risk  (06/21/2020)   Overall Financial Resource Strain (CARDIA)    Difficulty of Paying Living Expenses: Not hard at all  Food Insecurity: No Food Insecurity (06/21/2020)    Hunger Vital Sign    Worried About Running Out of Food in the Last Year: Never true    [place] in the Last Year: Never true  Transportation Needs: No Transportation Needs (06/21/2020)   PRAPARE - Hydrologist (Medical): No    Lack of Transportation (Non-Medical): No  Physical Activity: Not on file  Stress: No Stress Concern Present (06/21/2020)   [place]    Feeling of Stress : Not at all  Social Connections: Unknown (06/21/2020)   Social Connection and Isolation Panel [NHANES]    Frequency of Communication with Friends and Family: Not on file    Frequency of Social Gatherings with Friends and Family: Not on file    Attends Religious Services: Not on file    Active Member of Clubs or Organizations: Not on file    Attends Archivist Meetings: Not on file    Marital Status: Married     Review of Systems  Constitutional:  Negative for appetite change and unexpected weight change.  HENT:  Negative for congestion and sinus pressure.   Respiratory:  Negative for cough, chest tightness and shortness of breath.   Cardiovascular:  Negative for chest pain, palpitations and leg swelling.  Gastrointestinal:  Negative for abdominal pain, diarrhea, nausea and vomiting.  Genitourinary:  Negative for difficulty urinating and dysuria.  Musculoskeletal:  Negative for joint swelling and myalgias.  Skin:  Negative for color change and rash.  Neurological:  Negative for dizziness and headaches.  Psychiatric/Behavioral:  Negative for agitation and dysphoric mood.        Objective:     BP 136/74   Pulse 68   Temp 98.2 F (36.8 C)   Resp 16   Ht '5\' 4"'$  (1.626 m)   Wt 189 lb (85.7 kg)   SpO2 98%   BMI 32.44 kg/m  Wt Readings from Last 3 Encounters:  08/16/22 189 lb (85.7 kg)  04/16/22 184 lb 12.8 oz (83.8 kg)  12/12/21 187 lb 9.6 oz (85.1 kg)    Physical Exam Vitals  reviewed.  Constitutional:      General: She is not in acute distress.    Appearance: Normal appearance.  HENT:     Head: Normocephalic and atraumatic.     Right Ear: External ear normal.     Left Ear: External ear normal.  Eyes:     General: No scleral icterus.       Right eye: No discharge.        Left eye: No  discharge.     Conjunctiva/sclera: Conjunctivae normal.  Neck:     Thyroid: No thyromegaly.  Cardiovascular:     Rate and Rhythm: Normal rate and regular rhythm.  Pulmonary:     Effort: No respiratory distress.     Breath sounds: Normal breath sounds. No wheezing.  Abdominal:     General: Bowel sounds are normal.     Palpations: Abdomen is soft.     Tenderness: There is no abdominal tenderness.  Musculoskeletal:        General: No swelling or tenderness.     Cervical back: Neck supple. No tenderness.  Lymphadenopathy:     Cervical: No cervical adenopathy.  Skin:    Findings: No erythema or rash.  Neurological:     Mental Status: She is alert.  Psychiatric:        Mood and Affect: Mood normal.        Behavior: Behavior normal.      Outpatient Encounter Medications as of 08/16/2022  Medication Sig   anastrozole (ARIMIDEX) 1 MG tablet Take 1 mg by mouth daily.    aspirin 81 MG tablet Take 81 mg by mouth daily.    fluocinonide cream (LIDEX) 0.05 % Use as directed   hydrochlorothiazide (HYDRODIURIL) 25 MG tablet Take 1 tablet (25 mg total) by mouth daily.   lisinopril (ZESTRIL) 10 MG tablet Take 1 tablet (10 mg total) by mouth daily.   Multiple Vitamins-Minerals (CENTRUM ULTRA WOMENS PO) Take by mouth.   pantoprazole (PROTONIX) 40 MG tablet TAKE 1 TABLET(40 MG) BY MOUTH DAILY   potassium chloride (MICRO-K) 10 MEQ CR capsule TAKE 2 CAPSULES(20 MEQ) BY MOUTH DAILY   rOPINIRole (REQUIP) 1 MG tablet TAKE 1 TABLET(1 MG) BY MOUTH AT BEDTIME   rosuvastatin (CRESTOR) 20 MG tablet Take 1 tablet (20 mg total) by mouth daily.   [DISCONTINUED] Glucosamine-Chondroitin (MOVE  FREE PO) Take by mouth.   [DISCONTINUED] hydrochlorothiazide (HYDRODIURIL) 25 MG tablet TAKE 1 TABLET(25 MG) BY MOUTH DAILY   [DISCONTINUED] lisinopril (ZESTRIL) 10 MG tablet TAKE 1 TABLET(10 MG) BY MOUTH DAILY   [DISCONTINUED] pantoprazole (PROTONIX) 40 MG tablet TAKE 1 TABLET(40 MG) BY MOUTH DAILY   [DISCONTINUED] potassium chloride (MICRO-K) 10 MEQ CR capsule TAKE 2 CAPSULES(20 MEQ) BY MOUTH DAILY   [DISCONTINUED] rOPINIRole (REQUIP) 1 MG tablet TAKE 1 TABLET(1 MG) BY MOUTH AT BEDTIME   [DISCONTINUED] rosuvastatin (CRESTOR) 20 MG tablet TAKE 1 TABLET(20 MG) BY MOUTH DAILY   No facility-administered encounter medications on file as of 08/16/2022.     Lab Results  Component Value Date   WBC 3.4 (L) 08/16/2022   HGB 13.2 08/16/2022   HCT 38.9 08/16/2022   PLT 164.0 08/16/2022   GLUCOSE 91 08/16/2022   CHOL 138 08/16/2022   TRIG 58.0 08/16/2022   HDL 57.50 08/16/2022   LDLCALC 68 08/16/2022   ALT 15 08/16/2022   AST 17 08/16/2022   NA 143 08/16/2022   K 4.2 08/16/2022   CL 108 08/16/2022   CREATININE 0.71 08/16/2022   BUN 19 08/16/2022   CO2 30 08/16/2022   TSH 3.41 12/12/2021   HGBA1C 5.9 08/16/2022    No results found.     Assessment & Plan:  Primary hypertension Assessment & Plan: Blood pressure as outlined. Oon lisinopril and hctz.  Follow pressures.  Follow metabolic panel.   Orders: -     Basic metabolic panel  Hyperglycemia Assessment & Plan: Low carb diet and exercise.  Follow met b and a1c.  Orders: -     Hemoglobin A1c  Hypercholesterolemia Assessment & Plan: On crestor.  Low cholesterol diet and exercise.  Follow lipid panel and liver function tests.    Orders: -     Hepatic function panel -     Lipid panel  Leukopenia, unspecified type -     CBC with Differential/Platelet  Atypical ductal hyperplasia of breast Assessment & Plan: Has a history of a right breast atypical ductal hyperplasia. She is currently on anastrozole for chemoprevention  since 2019. Seeing oncology - last 01/14/22.  Recommended one more year aromatase inhibitor.   Gastroesophageal reflux disease, unspecified whether esophagitis present Assessment & Plan: No upper symptoms reported.  On protonix.     History of colon polyps Assessment & Plan: Colonoscopy 12/2021 - one 28mm polyp in the cecum- tubular adenoma   History of melanoma Assessment & Plan: Followed by dermatology.    Psoriatic arthritis ([place]) Assessment & Plan: Evaluated by rheumatology.  Desires not to take humira.  Follow.    Thrombocytopenia ([place]) Assessment & Plan: Follow cbc.    Other orders -     hydroCHLOROthiazide; Take 1 tablet (25 mg total) by mouth daily.  Dispense: 90 tablet; Refill: 3 -     Lisinopril; Take 1 tablet (10 mg total) by mouth daily.  Dispense: 90 tablet; Refill: 1 -     Pantoprazole Sodium; TAKE 1 TABLET(40 MG) BY MOUTH DAILY  Dispense: 90 tablet; Refill: 3 -     Potassium Chloride ER; TAKE 2 CAPSULES(20 MEQ) BY MOUTH DAILY  Dispense: 180 capsule; Refill: 3 -     rOPINIRole HCl; TAKE 1 TABLET(1 MG) BY MOUTH AT BEDTIME  Dispense: 90 tablet; Refill: 3 -     Rosuvastatin Calcium; Take 1 tablet (20 mg total) by mouth daily.  Dispense: 90 tablet; Refill: 3     Einar Pheasant, MD

## 2022-08-19 ENCOUNTER — Telehealth: Payer: Self-pay

## 2022-08-19 NOTE — Telephone Encounter (Signed)
Lm for pt to cb   Einar Pheasant, MD  [facility] Please call and notify - cholesterol levels look good.  Overall sugar control has improved some - A1c 5.9.  hgb, kidney function tests and liver function tests are wnl. White blood cell count is slightly decreased.  We will follow.  Will plan recheck with next labs.

## 2022-08-19 NOTE — Telephone Encounter (Signed)
Noted  

## 2022-08-19 NOTE — Telephone Encounter (Signed)
Patient returned  office phone call, note was read. 

## 2022-08-24 ENCOUNTER — Encounter: Payer: Self-pay | Admitting: Internal Medicine

## 2022-08-24 NOTE — Assessment & Plan Note (Signed)
On crestor.  Low cholesterol diet and exercise.  Follow lipid panel and liver function tests.   

## 2022-08-24 NOTE — Assessment & Plan Note (Signed)
Blood pressure as outlined. Oon lisinopril and hctz.  Follow pressures.  Follow metabolic panel.  °

## 2022-08-24 NOTE — Assessment & Plan Note (Signed)
Evaluated by rheumatology.  Desires not to take humira.  Follow.  °

## 2022-08-24 NOTE — Assessment & Plan Note (Signed)
Colonoscopy 12/2021 - one 5mm polyp in the cecum- tubular adenoma 

## 2022-08-24 NOTE — Assessment & Plan Note (Signed)
Low carb diet and exercise.  Follow met b and a1c.   

## 2022-08-24 NOTE — Assessment & Plan Note (Signed)
Followed by dermatology

## 2022-08-24 NOTE — Assessment & Plan Note (Signed)
Has a history of a right breast atypical ductal hyperplasia. She is currently on anastrozole for chemoprevention since 2019. Seeing oncology - last 01/14/22.  Recommended one more year aromatase inhibitor. 

## 2022-08-24 NOTE — Assessment & Plan Note (Signed)
No upper symptoms reported.  On protonix.   

## 2022-08-24 NOTE — Assessment & Plan Note (Signed)
Follow cbc.  

## 2022-08-26 DIAGNOSIS — I10 Essential (primary) hypertension: Secondary | ICD-10-CM | POA: Diagnosis not present

## 2022-08-26 DIAGNOSIS — N6099 Unspecified benign mammary dysplasia of unspecified breast: Secondary | ICD-10-CM | POA: Diagnosis not present

## 2022-08-26 DIAGNOSIS — Z1231 Encounter for screening mammogram for malignant neoplasm of breast: Secondary | ICD-10-CM | POA: Diagnosis not present

## 2022-08-26 DIAGNOSIS — K219 Gastro-esophageal reflux disease without esophagitis: Secondary | ICD-10-CM | POA: Diagnosis not present

## 2022-08-26 LAB — HM MAMMOGRAPHY

## 2022-10-26 ENCOUNTER — Other Ambulatory Visit: Payer: Self-pay | Admitting: Internal Medicine

## 2022-12-17 ENCOUNTER — Encounter: Payer: Self-pay | Admitting: Internal Medicine

## 2022-12-17 ENCOUNTER — Ambulatory Visit (INDEPENDENT_AMBULATORY_CARE_PROVIDER_SITE_OTHER): Payer: PPO | Admitting: Internal Medicine

## 2022-12-17 VITALS — BP 126/74 | HR 75 | Temp 97.8°F | Resp 16 | Ht 64.0 in | Wt 186.8 lb

## 2022-12-17 DIAGNOSIS — Z86010 Personal history of colonic polyps: Secondary | ICD-10-CM

## 2022-12-17 DIAGNOSIS — L4050 Arthropathic psoriasis, unspecified: Secondary | ICD-10-CM | POA: Diagnosis not present

## 2022-12-17 DIAGNOSIS — D696 Thrombocytopenia, unspecified: Secondary | ICD-10-CM

## 2022-12-17 DIAGNOSIS — K219 Gastro-esophageal reflux disease without esophagitis: Secondary | ICD-10-CM

## 2022-12-17 DIAGNOSIS — D72819 Decreased white blood cell count, unspecified: Secondary | ICD-10-CM | POA: Diagnosis not present

## 2022-12-17 DIAGNOSIS — R29898 Other symptoms and signs involving the musculoskeletal system: Secondary | ICD-10-CM | POA: Diagnosis not present

## 2022-12-17 DIAGNOSIS — I10 Essential (primary) hypertension: Secondary | ICD-10-CM | POA: Diagnosis not present

## 2022-12-17 DIAGNOSIS — N6099 Unspecified benign mammary dysplasia of unspecified breast: Secondary | ICD-10-CM | POA: Diagnosis not present

## 2022-12-17 DIAGNOSIS — E7800 Pure hypercholesterolemia, unspecified: Secondary | ICD-10-CM

## 2022-12-17 DIAGNOSIS — R739 Hyperglycemia, unspecified: Secondary | ICD-10-CM | POA: Diagnosis not present

## 2022-12-17 NOTE — Assessment & Plan Note (Signed)
Has a history of a right breast atypical ductal hyperplasia. She has been on anastrozole for chemoprevention since 2019. Seeing oncology.  Had f/u 08/2022.  Stopped anastrozole - since completed 5 years.  Mammogram 08/26/22 - Birads II.

## 2022-12-17 NOTE — Assessment & Plan Note (Addendum)
Blood pressure as outlined. On lisinopril and hctz.  Follow pressures.  Follow metabolic panel. Outside checks averaging 120-125/70s.

## 2022-12-17 NOTE — Assessment & Plan Note (Signed)
Evaluated by rheumatology.  Desires not to take humira.  Follow.  Stable.

## 2022-12-17 NOTE — Assessment & Plan Note (Signed)
Follow cbc.  

## 2022-12-17 NOTE — Assessment & Plan Note (Signed)
Colonoscopy 12/2021 - one 5mm polyp in the cecum- tubular adenoma 

## 2022-12-17 NOTE — Assessment & Plan Note (Signed)
No upper symptoms reported.  On protonix.   

## 2022-12-17 NOTE — Assessment & Plan Note (Signed)
On crestor.  Low cholesterol diet and exercise.  Follow lipid panel and liver function tests.   

## 2022-12-17 NOTE — Assessment & Plan Note (Signed)
Low carb diet and exercise.  Follow met b and a1c.   

## 2022-12-17 NOTE — Progress Notes (Signed)
Subjective:    Patient ID: Melissa Rhodes, female    DOB: July 23, 1950, 72 y.o.   MRN: [id number]  Patient here for  Chief Complaint  Patient presents with   Medical Management of Chronic Issues    HPI Here to follow up regarding hypercholesterolemia, hyperglycemia and hypertension.  Has a history of a right breast atypical ductal hyperplasia. She has been on anastrozole for chemoprevention since 2019. Seeing oncology. On anastrozole since 2019.  Had f/u 08/2022.  Recommended to stop anastrozole and f/u in one year. Mammogram 08/26/22 - Birads II.  She is doing well.  Stays active.  No chest pain or sob reported.  No abdominal pain or bowel change reported.  She does report noticing popping - left jaw.  No pain.  No limitations when opening and closing her mouth.  Increased stress - husband had surgery.  Handling things well.  He is doing better.     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 FEATURES OF RADIAL SCAR   BREAST SURGERY Left 2013   re excision of left breast showed small area of residual ADH, but she was not upsatged to DCIS or invasive cancer. The margins were clear.  Patient declined tamoxifen therapy   COLONOSCOPY  2006,2014   COLONOSCOPY W/ POLYPECTOMY   2014   COLONOSCOPY WITH PROPOFOL N/A 11/19/2017   Procedure: COLONOSCOPY WITH PROPOFOL;  Surgeon: Earline Mayotte, MD;  Location: [facility] ENDOSCOPY;  Service: Endoscopy;  Laterality: N/A;   COLONOSCOPY WITH PROPOFOL N/A 01/02/2022   Procedure: COLONOSCOPY WITH PROPOFOL;  Surgeon: Earline Mayotte, MD;  Location: [facility] ENDOSCOPY;  Service: Endoscopy;  Laterality: N/A;   ECTOPIC PREGNANCY SURGERY  1981   ESOPHAGOGASTRODUODENOSCOPY (EGD) WITH PROPOFOL N/A 11/19/2017   Procedure: ESOPHAGOGASTRODUODENOSCOPY (EGD) WITH PROPOFOL;  Surgeon: Earline Mayotte, MD;  Location: [facility] ENDOSCOPY;  Service: Endoscopy;  Laterality: N/A;   kidney stones  2009   SHOULDER SURGERY Right 2007   TUBAL LIGATION  1981   Family History  Problem Relation Age of Onset   Cancer Father 46       lung   Early death Father    Arthritis Mother    Depression Mother    Diabetes Mother    Heart disease Mother    Hypertension Mother    Hyperlipidemia Mother    Stroke Mother    Depression Sister    Diabetes Sister    Hyperlipidemia Sister    Hypertension Sister    Depression Sister    Hyperlipidemia Sister    Hypertension Sister    Miscarriages / Stillbirths Sister    Depression Sister    Hyperlipidemia  Sister    Hypertension Sister    Depression Brother    Diabetes Brother    Hyperlipidemia Brother    Hypertension Brother    Stroke Brother    Breast cancer Neg Hx    Ovarian cancer Neg Hx    Social History   Socioeconomic History   Marital status: Married    Spouse name: Not on file   Number of children: Not on file   Years of education: Not on file   Highest education level: 12th grade  Occupational History   Not on file  Tobacco Use   Smoking status: Never   Smokeless tobacco: Never  Vaping Use   Vaping Use: Never used  Substance and Sexual Activity   Alcohol use: No   Drug use: No   Sexual activity: Never  Other Topics Concern   Not on file  Social History Narrative   Not on file    Social Determinants of Health   Financial Resource Strain: Low Risk  (12/16/2022)   Overall Financial Resource Strain (CARDIA)    Difficulty of Paying Living Expenses: Not very hard  Food Insecurity: No Food Insecurity (12/16/2022)   Hunger Vital Sign    Worried About Running Out of Food in the Last Year: Never true    Ran Out of Food in the Last Year: Never true  Transportation Needs: No Transportation Needs (12/16/2022)   PRAPARE - Administrator, Civil Service (Medical): No    Lack of Transportation (Non-Medical): No  Physical Activity: Insufficiently Active (12/16/2022)   Exercise Vital Sign    Days of Exercise per Week: 4 days    Minutes of Exercise per Session: 20 min  Stress: No Stress Concern Present (12/16/2022)   [employer] of Occupational Health - Occupational Stress Questionnaire    Feeling of Stress : Only a little  Social Connections: Moderately Integrated (12/16/2022)   Social Connection and Isolation Panel [NHANES]    Frequency of Communication with Friends and Family: Twice a week    Frequency of Social Gatherings with Friends and Family: Once a week    Attends Religious Services: More than 4 times per year    Active Member of [employer] or Organizations: No    Attends Engineer, structural: Not on file    Marital Status: Married     Review of Systems  Constitutional:  Negative for appetite change and unexpected weight change.  HENT:  Negative for congestion and sinus pressure.   Respiratory:  Negative for cough, chest tightness and shortness of breath.   Cardiovascular:  Negative for chest pain, palpitations and leg swelling.  Gastrointestinal:  Negative for abdominal pain, diarrhea, nausea and vomiting.  Genitourinary:  Negative for difficulty urinating and dysuria.  Musculoskeletal:  Negative for joint swelling and myalgias.  Skin:  Negative for color change and rash.  Neurological:  Negative for dizziness and headaches.   Psychiatric/Behavioral:  Negative for agitation and dysphoric mood.        Objective:     BP 126/74   Pulse 75   Temp 97.8 F (36.6 C)   Resp 16   Ht 5\' 4"  (1.626 m)   Wt 186 lb 12.8 oz (84.7 kg)   SpO2 97%   BMI 32.06 kg/m  Wt Readings from Last 3 Encounters:  12/17/22 186 lb 12.8 oz (84.7 kg)  08/16/22 189 lb (85.7 kg)  04/16/22 184 lb 12.8 oz (83.8 kg)    Physical Exam Vitals reviewed.  Constitutional:  General: She is not in acute distress.    Appearance: Normal appearance.  HENT:     Head: Normocephalic and atraumatic.     Right Ear: External ear normal.     Left Ear: External ear normal.     Mouth/Throat:     Pharynx: No oropharyngeal exudate or posterior oropharyngeal erythema.  Eyes:     General: No scleral icterus.       Right eye: No discharge.        Left eye: No discharge.     Conjunctiva/sclera: Conjunctivae normal.  Neck:     Thyroid: No thyromegaly.  Cardiovascular:     Rate and Rhythm: Normal rate and regular rhythm.  Pulmonary:     Effort: No respiratory distress.     Breath sounds: Normal breath sounds. No wheezing.  Abdominal:     General: Bowel sounds are normal.     Palpations: Abdomen is soft.     Tenderness: There is no abdominal tenderness.  Musculoskeletal:        General: No swelling or tenderness.     Cervical back: Neck supple. No tenderness.     Comments: No pain to palpation - angle of jaw.    Lymphadenopathy:     Cervical: No cervical adenopathy.  Skin:    Findings: No erythema or rash.  Neurological:     Mental Status: She is alert.  Psychiatric:        Mood and Affect: Mood normal.        Behavior: Behavior normal.      Outpatient Encounter Medications as of 12/17/2022  Medication Sig   anastrozole (ARIMIDEX) 1 MG tablet Take 1 mg by mouth daily.    aspirin 81 MG tablet Take 81 mg by mouth daily.    fluocinonide cream (LIDEX) 0.05 % Use as directed   hydrochlorothiazide (HYDRODIURIL) 25 MG tablet TAKE 1  TABLET(25 MG) BY MOUTH DAILY   lisinopril (ZESTRIL) 10 MG tablet Take 1 tablet (10 mg total) by mouth daily.   Multiple Vitamins-Minerals (CENTRUM ULTRA WOMENS PO) Take by mouth.   pantoprazole (PROTONIX) 40 MG tablet TAKE 1 TABLET(40 MG) BY MOUTH DAILY   potassium chloride (MICRO-K) 10 MEQ CR capsule TAKE 2 CAPSULES(20 MEQ) BY MOUTH DAILY   rOPINIRole (REQUIP) 1 MG tablet TAKE 1 TABLET(1 MG) BY MOUTH AT BEDTIME   rosuvastatin (CRESTOR) 20 MG tablet Take 1 tablet (20 mg total) by mouth daily.   No facility-administered encounter medications on file as of 12/17/2022.     Lab Results  Component Value Date   WBC 3.4 (L) 08/16/2022   HGB 13.2 08/16/2022   HCT 38.9 08/16/2022   PLT 164.0 08/16/2022   GLUCOSE 91 08/16/2022   CHOL 138 08/16/2022   TRIG 58.0 08/16/2022   HDL 57.50 08/16/2022   LDLCALC 68 08/16/2022   ALT 15 08/16/2022   AST 17 08/16/2022   NA 143 08/16/2022   K 4.2 08/16/2022   CL 108 08/16/2022   CREATININE 0.71 08/16/2022   BUN 19 08/16/2022   CO2 30 08/16/2022   TSH 3.41 12/12/2021   HGBA1C 5.9 08/16/2022    No results found.     Assessment & Plan:  Hypercholesterolemia Assessment & Plan: On crestor.  Low cholesterol diet and exercise.  Follow lipid panel and liver function tests.    Orders: -     Hepatic function panel; Future -     TSH; Future -     Lipid panel; Future  Hyperglycemia Assessment &  Plan: Low carb diet and exercise.  Follow met b and a1c.    Orders: -     Hemoglobin A1c; Future  Primary hypertension Assessment & Plan: Blood pressure as outlined. On lisinopril and hctz.  Follow pressures.  Follow metabolic panel. Outside checks averaging 120-125/70s.   Orders: -     Basic metabolic panel; Future  Leukopenia, unspecified type -     CBC with Differential/Platelet; Future  Atypical ductal hyperplasia of breast Assessment & Plan: Has a history of a right breast atypical ductal hyperplasia. She has been on anastrozole for  chemoprevention since 2019. Seeing oncology.  Had f/u 08/2022.  Stopped anastrozole - since completed 5 years.  Mammogram 08/26/22 - Birads II.    Gastroesophageal reflux disease, unspecified whether esophagitis present Assessment & Plan: No upper symptoms reported.  On protonix.     History of colon polyps Assessment & Plan: Colonoscopy 12/2021 - one 5mm polyp in the cecum- tubular adenoma   Psoriatic arthritis [facility]) Assessment & Plan: Evaluated by rheumatology.  Desires not to take humira.  Follow.  Stable.    Thrombocytopenia ([facility]) Assessment & Plan: Follow cbc.    Popping of temporomandibular joint on opening of jaw Assessment & Plan: No pain. Discussed f/u with dentist regarding mouth guard, etc.        Dale [place], MD

## 2022-12-17 NOTE — Assessment & Plan Note (Signed)
No pain. Discussed f/u with dentist regarding mouth guard, etc.

## 2022-12-26 ENCOUNTER — Other Ambulatory Visit (INDEPENDENT_AMBULATORY_CARE_PROVIDER_SITE_OTHER): Payer: PPO

## 2022-12-26 DIAGNOSIS — R739 Hyperglycemia, unspecified: Secondary | ICD-10-CM | POA: Diagnosis not present

## 2022-12-26 DIAGNOSIS — E7800 Pure hypercholesterolemia, unspecified: Secondary | ICD-10-CM

## 2022-12-26 DIAGNOSIS — D72819 Decreased white blood cell count, unspecified: Secondary | ICD-10-CM

## 2022-12-26 DIAGNOSIS — I10 Essential (primary) hypertension: Secondary | ICD-10-CM | POA: Diagnosis not present

## 2022-12-26 LAB — BASIC METABOLIC PANEL
BUN: 21 mg/dL (ref 6–23)
CO2: 30 mEq/L (ref 19–32)
Calcium: 9.3 mg/dL (ref 8.4–10.5)
Chloride: 107 mEq/L (ref 96–112)
Creatinine, Ser: 0.75 mg/dL (ref 0.40–1.20)
GFR: 79.49 mL/min (ref >60.00)
Glucose, Bld: 100 mg/dL — ABNORMAL HIGH (ref 70–99)
Potassium: 4.4 mEq/L (ref 3.5–5.1)
Sodium: 143 mEq/L (ref 135–145)

## 2022-12-26 LAB — HEMOGLOBIN A1C: Hgb A1c MFr Bld: 5.8 % (ref 4.6–6.5)

## 2022-12-26 LAB — CBC WITH DIFFERENTIAL/PLATELET
Basophils Absolute: 0 10*3/uL (ref 0.0–0.1)
Basophils Relative: 0.5 % (ref 0.0–3.0)
Eosinophils Absolute: 0.1 10*3/uL (ref 0.0–0.7)
Eosinophils Relative: 2.8 % (ref 0.0–5.0)
HCT: 39.3 % (ref 36.0–46.0)
Hemoglobin: 13.1 g/dL (ref 12.0–15.0)
Lymphocytes Relative: 23.5 % (ref 12.0–46.0)
Lymphs Abs: 0.9 10*3/uL (ref 0.7–4.0)
MCHC: 33.5 g/dL (ref 30.0–36.0)
MCV: 86.7 fl (ref 78.0–100.0)
Monocytes Absolute: 0.3 10*3/uL (ref 0.1–1.0)
Monocytes Relative: 9.2 % (ref 3.0–12.0)
Neutro Abs: 2.4 10*3/uL (ref 1.4–7.7)
Neutrophils Relative %: 64 % (ref 43.0–77.0)
Platelets: 172 10*3/uL (ref 150.0–400.0)
RBC: 4.53 Mil/uL (ref 3.87–5.11)
RDW: 14.5 % (ref 11.5–15.5)
WBC: 3.7 10*3/uL — ABNORMAL LOW (ref 4.0–10.5)

## 2022-12-26 LAB — LIPID PANEL
Cholesterol: 148 mg/dL (ref 0–200)
HDL: 59.7 mg/dL (ref >39.00)
LDL Cholesterol: 75 mg/dL (ref 0–99)
NonHDL: 88.69
Total CHOL/HDL Ratio: 2
Triglycerides: 69 mg/dL (ref 0.0–149.0)
VLDL: 13.8 mg/dL (ref 0.0–40.0)

## 2022-12-26 LAB — HEPATIC FUNCTION PANEL
ALT: 16 U/L (ref 0–35)
AST: 19 U/L (ref 0–37)
Albumin: 3.9 g/dL (ref 3.5–5.2)
Alkaline Phosphatase: 66 U/L (ref 39–117)
Bilirubin, Direct: 0.1 mg/dL (ref 0.0–0.3)
Total Bilirubin: 0.5 mg/dL (ref 0.2–1.2)
Total Protein: 6 g/dL (ref 6.0–8.3)

## 2022-12-26 LAB — TSH: TSH: 4.27 u[IU]/mL (ref 0.35–5.50)

## 2023-01-13 ENCOUNTER — Telehealth: Payer: Self-pay

## 2023-01-13 ENCOUNTER — Telehealth (INDEPENDENT_AMBULATORY_CARE_PROVIDER_SITE_OTHER): Payer: PPO | Admitting: Nurse Practitioner

## 2023-01-13 ENCOUNTER — Other Ambulatory Visit: Payer: Self-pay

## 2023-01-13 ENCOUNTER — Encounter: Payer: Self-pay | Admitting: Nurse Practitioner

## 2023-01-13 ENCOUNTER — Other Ambulatory Visit: Payer: Self-pay | Admitting: Nurse Practitioner

## 2023-01-13 VITALS — Temp 101.9°F

## 2023-01-13 DIAGNOSIS — Z1152 Encounter for screening for COVID-19: Secondary | ICD-10-CM

## 2023-01-13 DIAGNOSIS — U071 COVID-19: Secondary | ICD-10-CM | POA: Insufficient documentation

## 2023-01-13 DIAGNOSIS — Z20822 Contact with and (suspected) exposure to covid-19: Secondary | ICD-10-CM | POA: Diagnosis not present

## 2023-01-13 LAB — POC COVID19 BINAXNOW: SARS Coronavirus 2 Ag: POSITIVE — AB

## 2023-01-13 MED ORDER — NIRMATRELVIR/RITONAVIR (PAXLOVID)TABLET
3.0000 | ORAL_TABLET | Freq: Two times a day (BID) | ORAL | 0 refills | Status: AC
Start: 2023-01-13 — End: 2023-01-18

## 2023-01-13 NOTE — Assessment & Plan Note (Addendum)
Patient with symptoms x 1 day. Advised need for positive COVID test to treat with anti-virals. She declines testing at this time. She will continue Tylenol and Mucinex to help with symptom relief. Advised adequate fluid intake. Counseled on quarantine protocol. Strict precautions given to patient. She will contact if she decides to take a COVID test and has a positive result.

## 2023-01-13 NOTE — Assessment & Plan Note (Signed)
Patient presented to office for COVID testing after virtual visit earlier today. Test in office was positive. Will treat with Paxlovid, GFR- 79.49. Counseled on common side effects. Advised to stop her Crestor while taking this medication and for 5 days after completing it. She can continue her other medications as well as Tylenol and Mucinex. Return precautions given to patient.

## 2023-01-13 NOTE — Telephone Encounter (Signed)
Called and spoke with pt in regards to appt to get info and to see if pt was ale to come by the office for a covid test. Pt declined and stated she is pretty sure she has covid, Pt was informed that Bethanie Dicker, NP would not be sending in antiviral medications with out having a positive test pt verbalized understanding.

## 2023-01-13 NOTE — Telephone Encounter (Signed)
Called pt to inform her that her covid test was positive and to see if she wanted the antiviral medications she verbalized understanding and that yes she does want the medication

## 2023-01-13 NOTE — Progress Notes (Signed)
MyChart Video Visit    Virtual Visit via Video Note   This visit type was conducted because this format is felt to be most appropriate for this patient at this time. Physical exam was limited by quality of the video and audio technology used for the visit. CMA was able to get the patient set up on a video visit.  Patient location: Home. Patient and provider in visit Provider location: Office  I discussed the limitations of evaluation and management by telemedicine and the availability of in person appointments. The patient expressed understanding and agreed to proceed.  Visit Date: 01/13/2023  Today's healthcare provider: Bethanie Dicker, NP     Subjective:    Patient ID: Melissa Rhodes, female    DOB: [birth year], 73 y.o.   MRN: [id number]  Chief Complaint  Patient presents with   Covid Exposure    Husband tested positive 01-10-23 Sore throat, fever , chills, runny nose, and congestion Sx's started yesterday with weakness     HPI Patient with symptoms that began yesterday. Her husband tested positive for COVID on 01/10/2023, she believes she may have it also. She has not taken a COVID test, she does not want to be tested for it.  Respiratory illness:  Cough- Yes, nonproductive  Congestion-    Sinus- Yes   Chest- No  Post nasal drip- Yes  Sore throat- Yes  Shortness of breath- No  Fever- Yes, Tmax- 102.8  Fatigue/Myalgia- Yes Headache- Yes Nausea/Vomiting- No Taste disturbance- No  Smell disturbance- No  Covid exposure- Yes  Covid vaccination- Never  Flu vaccination- UTD  Medications- Tylenol, Mucinex   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   left   Cancer (HCC) 2005   colon   Chronic kidney disease    GERD (gastroesophageal reflux disease)    History of chicken pox    History of kidney stones    Hypertension 2010   Mammographic microcalcification 2013   Melanoma (HCC) 2019   left arm    Mitral valve prolapse 2006   Neoplasm of uncertain behavior of breast 2014   left   Neutropenia (HCC)    Obesity, unspecified 2014   Personal history of colonic polyps 2013   Restless leg syndrome    Special screening for malignant neoplasms, colon 2014   Tubal pregnancy     Past Surgical History:  Procedure Laterality Date   BREAST BIOPSY Left 2013   BREAST BIOPSY Right 03/06/2017   LIQ coil clip. FEATURES OF RADIAL SCAR   BREAST SURGERY Left 2013   re excision of left breast showed small area of residual ADH, but she was not upsatged to DCIS or invasive cancer. The margins were clear.  Patient declined tamoxifen therapy   COLONOSCOPY  2006,2014   COLONOSCOPY W/ POLYPECTOMY  2014   COLONOSCOPY WITH PROPOFOL N/A 11/19/2017   Procedure: COLONOSCOPY WITH PROPOFOL;  Surgeon: Earline Mayotte, MD;  Location: [facility];  Service: Endoscopy;  Laterality: N/A;   COLONOSCOPY WITH PROPOFOL N/A 01/02/2022   Procedure: COLONOSCOPY WITH PROPOFOL;  Surgeon: Earline Mayotte, MD;  Location: [facility];  Service: Endoscopy;  Laterality: N/A;   ECTOPIC PREGNANCY SURGERY  1981   ESOPHAGOGASTRODUODENOSCOPY (EGD) WITH PROPOFOL N/A 11/19/2017   Procedure: ESOPHAGOGASTRODUODENOSCOPY (EGD) WITH PROPOFOL;  Surgeon: Earline Mayotte, MD;  Location: [facility];  Service: Endoscopy;  Laterality: N/A;   kidney stones  2009   SHOULDER SURGERY Right 2007  TUBAL LIGATION  1981    Family History  Problem Relation Age of Onset   Cancer Father 52       lung   Early death Father    Arthritis Mother    Depression Mother    Diabetes Mother    Heart disease Mother    Hypertension Mother    Hyperlipidemia Mother    Stroke Mother    Depression Sister    Diabetes Sister    Hyperlipidemia Sister    Hypertension Sister    Depression Sister    Hyperlipidemia Sister    Hypertension Sister    Miscarriages / Stillbirths Sister    Depression Sister    Hyperlipidemia Sister    Hypertension  Sister    Depression Brother    Diabetes Brother    Hyperlipidemia Brother    Hypertension Brother    Stroke Brother    Breast cancer Neg Hx    Ovarian cancer Neg Hx     Social History   Socioeconomic History   Marital status: Married    Spouse name: Not on file   Number of children: Not on file   Years of education: Not on file   Highest education level: 12th grade  Occupational History   Not on file  Tobacco Use   Smoking status: Never   Smokeless tobacco: Never  Vaping Use   Vaping status: Never Used  Substance and Sexual Activity   Alcohol use: No   Drug use: No   Sexual activity: Never  Other Topics Concern   Not on file  Social History Narrative   Not on file   Social Determinants of Health   Financial Resource Strain: Low Risk  (12/16/2022)   Overall Financial Resource Strain (CARDIA)    Difficulty of Paying Living Expenses: Not very hard  Food Insecurity: No Food Insecurity (12/16/2022)   Hunger Vital Sign    Worried About Running Out of Food in the Last Year: Never true    Ran Out of Food in the Last Year: Never true  Transportation Needs: No Transportation Needs (12/16/2022)   PRAPARE - Administrator, Civil Service (Medical): No    Lack of Transportation (Non-Medical): No  Physical Activity: Insufficiently Active (12/16/2022)   Exercise Vital Sign    Days of Exercise per Week: 4 days    Minutes of Exercise per Session: 20 min  Stress: No Stress Concern Present (12/16/2022)   [employer] of Occupational Health - Occupational Stress Questionnaire    Feeling of Stress : Only a little  Social Connections: Moderately Integrated (12/16/2022)   Social Connection and Isolation Panel [NHANES]    Frequency of Communication with Friends and Family: Twice a week    Frequency of Social Gatherings with Friends and Family: Once a week    Attends Religious Services: More than 4 times per year    Active Member of [employer] or Organizations: No    Attends Tax inspector Meetings: Not on file    Marital Status: Married  Catering manager Violence: Not At Risk (06/21/2020)   Humiliation, Afraid, Rape, and Kick questionnaire    Fear of Current or Ex-Partner: No    Emotionally Abused: No    Physically Abused: No    Sexually Abused: No    Outpatient Medications Prior to Visit  Medication Sig Dispense Refill   anastrozole (ARIMIDEX) 1 MG tablet Take 1 mg by mouth daily.      aspirin 81 MG tablet Take 81  mg by mouth daily.      fluocinonide cream (LIDEX) 0.05 % Use as directed 30 g 0   hydrochlorothiazide (HYDRODIURIL) 25 MG tablet TAKE 1 TABLET(25 MG) BY MOUTH DAILY 90 tablet 3   lisinopril (ZESTRIL) 10 MG tablet Take 1 tablet (10 mg total) by mouth daily. 90 tablet 1   Multiple Vitamins-Minerals (CENTRUM ULTRA WOMENS PO) Take by mouth.     pantoprazole (PROTONIX) 40 MG tablet TAKE 1 TABLET(40 MG) BY MOUTH DAILY 90 tablet 3   potassium chloride (MICRO-K) 10 MEQ CR capsule TAKE 2 CAPSULES(20 MEQ) BY MOUTH DAILY 180 capsule 3   rOPINIRole (REQUIP) 1 MG tablet TAKE 1 TABLET(1 MG) BY MOUTH AT BEDTIME 90 tablet 3   rosuvastatin (CRESTOR) 20 MG tablet Take 1 tablet (20 mg total) by mouth daily. 90 tablet 3   No facility-administered medications prior to visit.    No Known Allergies  ROS See HPI    Objective:    Physical Exam  Temp (!) 101.9 F (38.8 C) Comment: Pt reported 102.8 on last might Wt Readings from Last 3 Encounters:  12/17/22 186 lb 12.8 oz (84.7 kg)  08/16/22 189 lb (85.7 kg)  04/16/22 184 lb 12.8 oz (83.8 kg)   GENERAL: alert, oriented, appears well and in no acute distress   HEENT: atraumatic, conjunttiva clear, no obvious abnormalities on inspection of external nose and ears   NECK: normal movements of the head and neck   LUNGS: on inspection no signs of respiratory distress, breathing rate appears normal, no obvious gross SOB, gasping or wheezing   CV: no obvious cyanosis   MS: moves all visible extremities without  noticeable abnormality   PSYCH/NEURO: pleasant and cooperative, no obvious depression or anxiety, speech and thought processing grossly intact     Assessment & Plan:   Problem List Items Addressed This Visit       Other   Contact with and (suspected) exposure to covid-19 - Primary    Patient with symptoms x 1 day. Advised need for positive COVID test to treat with anti-virals. She declines testing at this time. She will continue Tylenol and Mucinex to help with symptom relief. Advised adequate fluid intake. Counseled on quarantine protocol. Strict precautions given to patient. She will contact if she decides to take a COVID test and has a positive result.        I am having Melissa Rhodes maintain her anastrozole, aspirin, Multiple Vitamins-Minerals (CENTRUM ULTRA WOMENS PO), fluocinonide cream, lisinopril, pantoprazole, potassium chloride, rOPINIRole, rosuvastatin, and hydrochlorothiazide.  No orders of the defined types were placed in this encounter.   I discussed the assessment and treatment plan with the patient. The patient was provided an opportunity to ask questions and all were answered. The patient agreed with the plan and demonstrated an understanding of the instructions.   The patient was advised to call back or seek an in-person evaluation if the symptoms worsen or if the condition fails to improve as anticipated.   Bethanie Dicker, NP [facility] [phone] (phone) [phone] (fax)  [facility]

## 2023-01-13 NOTE — Telephone Encounter (Signed)
Pt has been informed.

## 2023-01-28 NOTE — Telephone Encounter (Signed)
Error

## 2023-04-22 ENCOUNTER — Ambulatory Visit: Payer: PPO | Admitting: Internal Medicine

## 2023-04-22 ENCOUNTER — Encounter: Payer: Self-pay | Admitting: Internal Medicine

## 2023-04-22 VITALS — BP 118/72 | HR 71 | Temp 98.0°F | Resp 16 | Ht 64.0 in | Wt 186.0 lb

## 2023-04-22 DIAGNOSIS — R739 Hyperglycemia, unspecified: Secondary | ICD-10-CM

## 2023-04-22 DIAGNOSIS — L4050 Arthropathic psoriasis, unspecified: Secondary | ICD-10-CM

## 2023-04-22 DIAGNOSIS — Z85820 Personal history of malignant melanoma of skin: Secondary | ICD-10-CM | POA: Diagnosis not present

## 2023-04-22 DIAGNOSIS — K219 Gastro-esophageal reflux disease without esophagitis: Secondary | ICD-10-CM | POA: Diagnosis not present

## 2023-04-22 DIAGNOSIS — E7800 Pure hypercholesterolemia, unspecified: Secondary | ICD-10-CM

## 2023-04-22 DIAGNOSIS — Z23 Encounter for immunization: Secondary | ICD-10-CM | POA: Diagnosis not present

## 2023-04-22 DIAGNOSIS — D72819 Decreased white blood cell count, unspecified: Secondary | ICD-10-CM | POA: Diagnosis not present

## 2023-04-22 DIAGNOSIS — Z0000 Encounter for general adult medical examination without abnormal findings: Secondary | ICD-10-CM | POA: Diagnosis not present

## 2023-04-22 DIAGNOSIS — D696 Thrombocytopenia, unspecified: Secondary | ICD-10-CM | POA: Diagnosis not present

## 2023-04-22 DIAGNOSIS — N6099 Unspecified benign mammary dysplasia of unspecified breast: Secondary | ICD-10-CM | POA: Diagnosis not present

## 2023-04-22 LAB — HEPATIC FUNCTION PANEL
ALT: 13 U/L (ref 0–35)
AST: 18 U/L (ref 0–37)
Albumin: 4.2 g/dL (ref 3.5–5.2)
Alkaline Phosphatase: 68 U/L (ref 39–117)
Bilirubin, Direct: 0.1 mg/dL (ref 0.0–0.3)
Total Bilirubin: 0.6 mg/dL (ref 0.2–1.2)
Total Protein: 6 g/dL (ref 6.0–8.3)

## 2023-04-22 LAB — HEMOGLOBIN A1C: Hgb A1c MFr Bld: 5.8 % (ref 4.6–6.5)

## 2023-04-22 LAB — BASIC METABOLIC PANEL
BUN: 18 mg/dL (ref 6–23)
CO2: 28 meq/L (ref 19–32)
Calcium: 9 mg/dL (ref 8.4–10.5)
Chloride: 106 meq/L (ref 96–112)
Creatinine, Ser: 0.74 mg/dL (ref 0.40–1.20)
GFR: 80.6 mL/min (ref >60.00)
Glucose, Bld: 103 mg/dL — ABNORMAL HIGH (ref 70–99)
Potassium: 4 meq/L (ref 3.5–5.1)
Sodium: 142 meq/L (ref 135–145)

## 2023-04-22 LAB — CBC WITH DIFFERENTIAL/PLATELET
Basophils Absolute: 0 10*3/uL (ref 0.0–0.1)
Basophils Relative: 0.5 % (ref 0.0–3.0)
Eosinophils Absolute: 0.1 10*3/uL (ref 0.0–0.7)
Eosinophils Relative: 1.7 % (ref 0.0–5.0)
HCT: 39.8 % (ref 36.0–46.0)
Hemoglobin: 13.8 g/dL (ref 12.0–15.0)
Lymphocytes Relative: 21.2 % (ref 12.0–46.0)
Lymphs Abs: 0.8 10*3/uL (ref 0.7–4.0)
MCHC: 34.5 g/dL (ref 30.0–36.0)
MCV: 85.9 fL (ref 78.0–100.0)
Monocytes Absolute: 0.3 10*3/uL (ref 0.1–1.0)
Monocytes Relative: 9.2 % (ref 3.0–12.0)
Neutro Abs: 2.4 10*3/uL (ref 1.4–7.7)
Neutrophils Relative %: 67.4 % (ref 43.0–77.0)
Platelets: 168 10*3/uL (ref 150.0–400.0)
RBC: 4.63 Mil/uL (ref 3.87–5.11)
RDW: 14.5 % (ref 11.5–15.5)
WBC: 3.6 10*3/uL — ABNORMAL LOW (ref 4.0–10.5)

## 2023-04-22 LAB — LIPID PANEL
Cholesterol: 151 mg/dL (ref 0–200)
HDL: 53.9 mg/dL (ref >39.00)
LDL Cholesterol: 83 mg/dL (ref 0–99)
NonHDL: 96.84
Total CHOL/HDL Ratio: 3
Triglycerides: 70 mg/dL (ref 0.0–149.0)
VLDL: 14 mg/dL (ref 0.0–40.0)

## 2023-04-22 LAB — TSH: TSH: 3.59 u[IU]/mL (ref 0.35–5.50)

## 2023-04-22 NOTE — Assessment & Plan Note (Signed)
Low carb diet and exercise.  Follow met b and a1c.   

## 2023-04-22 NOTE — Assessment & Plan Note (Signed)
Followed by dermatology

## 2023-04-22 NOTE — Assessment & Plan Note (Signed)
Has a history of a right breast atypical ductal hyperplasia. She has been on anastrozole for chemoprevention since 2019. Seeing oncology.  Had f/u 08/2022.  Stopped anastrozole - since completed 5 years.  Off anastrozole. Mammogram 08/26/22 - Birads II.

## 2023-04-22 NOTE — Assessment & Plan Note (Signed)
No upper symptoms reported.  On protonix.   

## 2023-04-22 NOTE — Assessment & Plan Note (Signed)
Evaluated by rheumatology.  Desires not to take humira.  Follow.  Stable.

## 2023-04-22 NOTE — Assessment & Plan Note (Signed)
Physical today 04/22/23.  Colonoscopy 12/2021 - one 5mm cecal polyp.  Mammogram 08/26/22- Birads II.

## 2023-04-22 NOTE — Progress Notes (Signed)
Subjective:    Patient ID: Marcello Moores, female    DOB: June 06, 1951, 72 y.o.   MRN: [id number]  Patient here for  Chief Complaint  Patient presents with   Annual Exam    HPI Here for a physical exam. Has a history of a right breast atypical ductal hyperplasia. She has been on anastrozole for chemoprevention since 2019. Seeing oncology. On anastrozole since 2019.  Had f/u 08/2022.  Recommended to stop anastrozole and f/u in one year. She is off anastrozole. Mammogram 08/26/22 - Birads II. She feels good.  Staying active. No chest pain or sob reported.  No abdominal pain or bowel change reported.  Has bowel movement q day.    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   left   Cancer [facility]) 2005   colon   Chronic kidney disease    GERD (gastroesophageal reflux disease)    History of chicken pox    History of kidney stones    Hypertension 2010   Mammographic microcalcification 2013   Melanoma (HCC) 2019   left arm   Mitral valve prolapse 2006   Neoplasm of uncertain behavior of breast 2014   left   Neutropenia (HCC)    Obesity, unspecified 2014   Personal history of colonic polyps 2013   Restless leg syndrome    Special screening for malignant neoplasms, colon 2014   Tubal pregnancy    Past Surgical History:  Procedure Laterality Date   BREAST BIOPSY Left 2013   BREAST BIOPSY Right 03/06/2017   LIQ coil clip. FEATURES OF RADIAL SCAR   BREAST SURGERY Left 2013   re excision of left breast showed small area of residual ADH, but she was not upsatged to DCIS or invasive cancer. The margins were clear.  Patient declined tamoxifen therapy   COLONOSCOPY  2006,2014   COLONOSCOPY W/ POLYPECTOMY  2014   COLONOSCOPY WITH PROPOFOL N/A 11/19/2017   Procedure: COLONOSCOPY WITH PROPOFOL;  Surgeon: Earline Mayotte, MD;  Location: [facility];  Service: Endoscopy;  Laterality: N/A;   COLONOSCOPY WITH PROPOFOL N/A  01/02/2022   Procedure: COLONOSCOPY WITH PROPOFOL;  Surgeon: Earline Mayotte, MD;  Location: [facility];  Service: Endoscopy;  Laterality: N/A;   ECTOPIC PREGNANCY SURGERY  1981   ESOPHAGOGASTRODUODENOSCOPY (EGD) WITH PROPOFOL N/A 11/19/2017   Procedure: ESOPHAGOGASTRODUODENOSCOPY (EGD) WITH PROPOFOL;  Surgeon: Earline Mayotte, MD;  Location: [facility];  Service: Endoscopy;  Laterality: N/A;   kidney stones  2009   SHOULDER SURGERY Right 2007   TUBAL LIGATION  1981   Family History  Problem Relation Age of Onset   Cancer Father 60       lung   Early death Father    Arthritis Mother    Depression Mother    Diabetes Mother    Heart disease Mother    Hypertension Mother    Hyperlipidemia Mother    Stroke Mother    Depression Sister    Diabetes Sister    Hyperlipidemia Sister    Hypertension Sister    Depression Sister    Hyperlipidemia Sister    Hypertension Sister    Miscarriages / Stillbirths Sister    Depression Sister    Hyperlipidemia Sister    Hypertension Sister    Depression Brother    Diabetes Brother    Hyperlipidemia Brother    Hypertension Brother    Stroke Brother    Breast cancer Neg Hx  Ovarian cancer Neg Hx    Social History   Socioeconomic History   Marital status: Married    Spouse name: Not on file   Number of children: Not on file   Years of education: Not on file   Highest education level: 12th grade  Occupational History   Not on file  Tobacco Use   Smoking status: Never   Smokeless tobacco: Never  Vaping Use   Vaping status: Never Used  Substance and Sexual Activity   Alcohol use: No   Drug use: No   Sexual activity: Never  Other Topics Concern   Not on file  Social History Narrative   Not on file   Social Determinants of Health   Financial Resource Strain: Low Risk  (12/16/2022)   Overall Financial Resource Strain (CARDIA)    Difficulty of Paying Living Expenses: Not very hard  Food Insecurity: No Food Insecurity  (12/16/2022)   Hunger Vital Sign    Worried About Running Out of Food in the Last Year: Never true    Ran Out of Food in the Last Year: Never true  Transportation Needs: No Transportation Needs (12/16/2022)   PRAPARE - Administrator, Civil Service (Medical): No    Lack of Transportation (Non-Medical): No  Physical Activity: Insufficiently Active (12/16/2022)   Exercise Vital Sign    Days of Exercise per Week: 4 days    Minutes of Exercise per Session: 20 min  Stress: No Stress Concern Present (12/16/2022)   [employer] of Occupational Health - Occupational Stress Questionnaire    Feeling of Stress : Only a little  Social Connections: Moderately Integrated (12/16/2022)   Social Connection and Isolation Panel [NHANES]    Frequency of Communication with Friends and Family: Twice a week    Frequency of Social Gatherings with Friends and Family: Once a week    Attends Religious Services: More than 4 times per year    Active Member of [employer] or Organizations: No    Attends Engineer, structural: Not on file    Marital Status: Married     Review of Systems  Constitutional:  Negative for appetite change and unexpected weight change.  HENT:  Negative for congestion, sinus pressure and sore throat.   Eyes:  Negative for pain and visual disturbance.  Respiratory:  Negative for cough, chest tightness and shortness of breath.   Cardiovascular:  Negative for chest pain, palpitations and leg swelling.  Gastrointestinal:  Negative for abdominal pain, diarrhea, nausea and vomiting.  Genitourinary:  Negative for difficulty urinating and dysuria.  Musculoskeletal:  Negative for joint swelling and myalgias.  Skin:  Negative for color change and rash.  Neurological:  Negative for dizziness and headaches.  Hematological:  Negative for adenopathy. Does not bruise/bleed easily.  Psychiatric/Behavioral:  Negative for agitation and dysphoric mood.        Objective:     BP 118/72    Pulse 71   Temp 98 F (36.7 C)   Resp 16   Ht 5\' 4"  (1.626 m)   Wt 186 lb (84.4 kg)   SpO2 97%   BMI 31.93 kg/m  Wt Readings from Last 3 Encounters:  04/22/23 186 lb (84.4 kg)  12/17/22 186 lb 12.8 oz (84.7 kg)  08/16/22 189 lb (85.7 kg)    Physical Exam Vitals reviewed.  Constitutional:      General: She is not in acute distress.    Appearance: Normal appearance. She is well-developed.  HENT:  Head: Normocephalic and atraumatic.     Right Ear: External ear normal.     Left Ear: External ear normal.  Eyes:     General: No scleral icterus.       Right eye: No discharge.        Left eye: No discharge.     Conjunctiva/sclera: Conjunctivae normal.  Neck:     Thyroid: No thyromegaly.  Cardiovascular:     Rate and Rhythm: Normal rate and regular rhythm.  Pulmonary:     Effort: No tachypnea, accessory muscle usage or respiratory distress.     Breath sounds: Normal breath sounds. No decreased breath sounds or wheezing.  Chest:  Breasts:    Right: No inverted nipple, mass, nipple discharge or tenderness (no axillary adenopathy).     Left: No inverted nipple, mass, nipple discharge or tenderness (no axilarry adenopathy).  Abdominal:     General: Bowel sounds are normal.     Palpations: Abdomen is soft.     Tenderness: There is no abdominal tenderness.  Musculoskeletal:        General: No swelling or tenderness.     Cervical back: Neck supple. No tenderness.  Lymphadenopathy:     Cervical: No cervical adenopathy.  Skin:    Findings: No erythema or rash.  Neurological:     Mental Status: She is alert and oriented to person, place, and time.  Psychiatric:        Mood and Affect: Mood normal.        Behavior: Behavior normal.      Outpatient Encounter Medications as of 04/22/2023  Medication Sig   anastrozole (ARIMIDEX) 1 MG tablet Take 1 mg by mouth daily.    aspirin 81 MG tablet Take 81 mg by mouth daily.    fluocinonide cream (LIDEX) 0.05 % Use as directed    hydrochlorothiazide (HYDRODIURIL) 25 MG tablet TAKE 1 TABLET(25 MG) BY MOUTH DAILY   lisinopril (ZESTRIL) 10 MG tablet Take 1 tablet (10 mg total) by mouth daily.   Multiple Vitamins-Minerals (CENTRUM ULTRA WOMENS PO) Take by mouth.   pantoprazole (PROTONIX) 40 MG tablet TAKE 1 TABLET(40 MG) BY MOUTH DAILY   potassium chloride (MICRO-K) 10 MEQ CR capsule TAKE 2 CAPSULES(20 MEQ) BY MOUTH DAILY   rOPINIRole (REQUIP) 1 MG tablet TAKE 1 TABLET(1 MG) BY MOUTH AT BEDTIME   rosuvastatin (CRESTOR) 20 MG tablet Take 1 tablet (20 mg total) by mouth daily.   No facility-administered encounter medications on file as of 04/22/2023.     Lab Results  Component Value Date   WBC 3.7 (L) 12/26/2022   HGB 13.1 12/26/2022   HCT 39.3 12/26/2022   PLT 172.0 12/26/2022   GLUCOSE 100 (H) 12/26/2022   CHOL 148 12/26/2022   TRIG 69.0 12/26/2022   HDL 59.70 12/26/2022   LDLCALC 75 12/26/2022   ALT 16 12/26/2022   AST 19 12/26/2022   NA 143 12/26/2022   K 4.4 12/26/2022   CL 107 12/26/2022   CREATININE 0.75 12/26/2022   BUN 21 12/26/2022   CO2 30 12/26/2022   TSH 4.27 12/26/2022   HGBA1C 5.8 12/26/2022    No results found.     Assessment & Plan:  Routine general medical examination at a health care facility  Healthcare maintenance Assessment & Plan: Physical today 04/22/23.  Colonoscopy 12/2021 - one 5mm cecal polyp.  Mammogram 08/26/22- Birads II.    Hypercholesterolemia Assessment & Plan: On crestor.  Low cholesterol diet and exercise.  Follow lipid panel and  liver function tests.    Orders: -     Hepatic function panel -     TSH -     Lipid panel  Hyperglycemia Assessment & Plan: Low carb diet and exercise.  Follow met b and a1c.    Orders: -     Basic metabolic panel -     Hemoglobin A1c  Leukopenia, unspecified type -     CBC with Differential/Platelet  Need for influenza vaccination -     Flu Vaccine Trivalent High Dose (Fluad)  Atypical ductal hyperplasia of  breast Assessment & Plan: Has a history of a right breast atypical ductal hyperplasia. She has been on anastrozole for chemoprevention since 2019. Seeing oncology.  Had f/u 08/2022.  Stopped anastrozole - since completed 5 years.  Off anastrozole. Mammogram 08/26/22 - Birads II.    Gastroesophageal reflux disease, unspecified whether esophagitis present Assessment & Plan: No upper symptoms reported.  On protonix.     History of melanoma Assessment & Plan: Followed by dermatology.    Psoriatic arthritis (HCC) Assessment & Plan: Evaluated by rheumatology.  Desires not to take humira.  Follow.  Stable.    Thrombocytopenia (HCC) Assessment & Plan: Follow cbc.       Dale Bucklin, MD

## 2023-04-22 NOTE — Assessment & Plan Note (Signed)
Follow cbc.  

## 2023-04-22 NOTE — Assessment & Plan Note (Signed)
On crestor.  Low cholesterol diet and exercise.  Follow lipid panel and liver function tests.

## 2023-04-28 DIAGNOSIS — D2272 Melanocytic nevi of left lower limb, including hip: Secondary | ICD-10-CM | POA: Diagnosis not present

## 2023-04-28 DIAGNOSIS — L280 Lichen simplex chronicus: Secondary | ICD-10-CM | POA: Diagnosis not present

## 2023-04-28 DIAGNOSIS — L820 Inflamed seborrheic keratosis: Secondary | ICD-10-CM | POA: Diagnosis not present

## 2023-04-28 DIAGNOSIS — R208 Other disturbances of skin sensation: Secondary | ICD-10-CM | POA: Diagnosis not present

## 2023-04-28 DIAGNOSIS — D2262 Melanocytic nevi of left upper limb, including shoulder: Secondary | ICD-10-CM | POA: Diagnosis not present

## 2023-04-28 DIAGNOSIS — D225 Melanocytic nevi of trunk: Secondary | ICD-10-CM | POA: Diagnosis not present

## 2023-04-28 DIAGNOSIS — X32XXXA Exposure to sunlight, initial encounter: Secondary | ICD-10-CM | POA: Diagnosis not present

## 2023-04-28 DIAGNOSIS — D2271 Melanocytic nevi of right lower limb, including hip: Secondary | ICD-10-CM | POA: Diagnosis not present

## 2023-04-28 DIAGNOSIS — L538 Other specified erythematous conditions: Secondary | ICD-10-CM | POA: Diagnosis not present

## 2023-04-28 DIAGNOSIS — Z85820 Personal history of malignant melanoma of skin: Secondary | ICD-10-CM | POA: Diagnosis not present

## 2023-04-28 DIAGNOSIS — L570 Actinic keratosis: Secondary | ICD-10-CM | POA: Diagnosis not present

## 2023-04-28 DIAGNOSIS — D2261 Melanocytic nevi of right upper limb, including shoulder: Secondary | ICD-10-CM | POA: Diagnosis not present

## 2023-04-28 DIAGNOSIS — D485 Neoplasm of uncertain behavior of skin: Secondary | ICD-10-CM | POA: Diagnosis not present

## 2023-07-23 ENCOUNTER — Other Ambulatory Visit: Payer: Self-pay | Admitting: Internal Medicine

## 2023-07-29 ENCOUNTER — Other Ambulatory Visit: Payer: Self-pay | Admitting: Internal Medicine

## 2023-08-21 ENCOUNTER — Ambulatory Visit: Payer: PPO | Admitting: Internal Medicine

## 2023-08-27 ENCOUNTER — Telehealth: Payer: Self-pay | Admitting: Internal Medicine

## 2023-08-27 NOTE — Telephone Encounter (Signed)
 Good afternoon,   Dr Lorin Picket will not be in the office for your scheduled visit on 5-28. Please give the office a call and we will get you rescheduled.   Thank you  [id number], please reschedule this patient's 4 mo follow-up visit. [facility]

## 2023-09-05 ENCOUNTER — Ambulatory Visit: Admitting: Internal Medicine

## 2023-09-08 DIAGNOSIS — K219 Gastro-esophageal reflux disease without esophagitis: Secondary | ICD-10-CM | POA: Diagnosis not present

## 2023-09-08 DIAGNOSIS — N6099 Unspecified benign mammary dysplasia of unspecified breast: Secondary | ICD-10-CM | POA: Diagnosis not present

## 2023-09-08 DIAGNOSIS — Z87898 Personal history of other specified conditions: Secondary | ICD-10-CM | POA: Diagnosis not present

## 2023-09-08 DIAGNOSIS — I10 Essential (primary) hypertension: Secondary | ICD-10-CM | POA: Diagnosis not present

## 2023-09-08 DIAGNOSIS — Z1231 Encounter for screening mammogram for malignant neoplasm of breast: Secondary | ICD-10-CM | POA: Diagnosis not present

## 2023-09-08 DIAGNOSIS — M1990 Unspecified osteoarthritis, unspecified site: Secondary | ICD-10-CM | POA: Diagnosis not present

## 2023-10-27 ENCOUNTER — Other Ambulatory Visit: Payer: Self-pay | Admitting: Internal Medicine

## 2023-10-29 ENCOUNTER — Other Ambulatory Visit: Payer: Self-pay | Admitting: Internal Medicine

## 2023-11-25 ENCOUNTER — Ambulatory Visit: Admitting: Internal Medicine

## 2023-11-25 VITALS — BP 126/72 | HR 82 | Temp 98.0°F | Resp 16 | Ht 64.0 in | Wt 188.0 lb

## 2023-11-25 DIAGNOSIS — K219 Gastro-esophageal reflux disease without esophagitis: Secondary | ICD-10-CM

## 2023-11-25 DIAGNOSIS — D696 Thrombocytopenia, unspecified: Secondary | ICD-10-CM | POA: Diagnosis not present

## 2023-11-25 DIAGNOSIS — L4050 Arthropathic psoriasis, unspecified: Secondary | ICD-10-CM | POA: Diagnosis not present

## 2023-11-25 DIAGNOSIS — R739 Hyperglycemia, unspecified: Secondary | ICD-10-CM

## 2023-11-25 DIAGNOSIS — N6099 Unspecified benign mammary dysplasia of unspecified breast: Secondary | ICD-10-CM | POA: Diagnosis not present

## 2023-11-25 DIAGNOSIS — M545 Low back pain, unspecified: Secondary | ICD-10-CM | POA: Diagnosis not present

## 2023-11-25 DIAGNOSIS — E7800 Pure hypercholesterolemia, unspecified: Secondary | ICD-10-CM | POA: Diagnosis not present

## 2023-11-25 DIAGNOSIS — D72819 Decreased white blood cell count, unspecified: Secondary | ICD-10-CM | POA: Diagnosis not present

## 2023-11-25 DIAGNOSIS — Z860100 Personal history of colon polyps, unspecified: Secondary | ICD-10-CM

## 2023-11-25 DIAGNOSIS — I10 Essential (primary) hypertension: Secondary | ICD-10-CM | POA: Diagnosis not present

## 2023-11-25 NOTE — Progress Notes (Unsigned)
 Subjective:    Patient ID: Melissa Rhodes, female    DOB: April 16, 1951, 73 y.o.   MRN: [id number]  Patient here for  Chief Complaint  Patient presents with   Medical Management of Chronic Issues    HPI Here for a scheduled follow up - follow up regarding GERD, hypercholesterolemia and hyperglycemia. Had f/u with [facility] surgical oncology 09/08/23 - f/u right breast atypical ductal hyperplasia. Stable. Recommended f/u in one year.    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   left   Cancer [facility]) 2005   colon   Chronic kidney disease    GERD (gastroesophageal reflux disease)    History of chicken pox    History of kidney stones    Hypertension 2010   Mammographic microcalcification 2013   Melanoma (HCC) 2019   left arm   Mitral valve prolapse 2006   Neoplasm of uncertain behavior of breast 2014   left   Neutropenia (HCC)    Obesity, unspecified 2014   Personal history of colonic polyps 2013   Restless leg syndrome    Special screening for malignant neoplasms, colon 2014   Tubal pregnancy    Past Surgical History:  Procedure Laterality Date   BREAST BIOPSY Left 2013   BREAST BIOPSY Right 03/06/2017   LIQ coil clip. FEATURES OF RADIAL SCAR   BREAST SURGERY Left 2013   re excision of left breast showed small area of residual ADH, but she was not upsatged to DCIS or invasive cancer. The margins were clear.  Patient declined tamoxifen  therapy   COLONOSCOPY  2006,2014   COLONOSCOPY W/ POLYPECTOMY  2014   COLONOSCOPY WITH PROPOFOL  N/A 11/19/2017   Procedure: COLONOSCOPY WITH PROPOFOL ;  Surgeon: Marshall Skeeter, MD;  Location: [facility] ENDOSCOPY;  Service: Endoscopy;  Laterality: N/A;   COLONOSCOPY WITH PROPOFOL  N/A 01/02/2022   Procedure: COLONOSCOPY WITH PROPOFOL ;  Surgeon: Marshall Skeeter, MD;  Location: [facility] ENDOSCOPY;  Service: Endoscopy;  Laterality: N/A;   ECTOPIC PREGNANCY SURGERY  1981    ESOPHAGOGASTRODUODENOSCOPY (EGD) WITH PROPOFOL  N/A 11/19/2017   Procedure: ESOPHAGOGASTRODUODENOSCOPY (EGD) WITH PROPOFOL ;  Surgeon: Marshall Skeeter, MD;  Location: [facility] ENDOSCOPY;  Service: Endoscopy;  Laterality: N/A;   kidney stones  2009   SHOULDER SURGERY Right 2007   TUBAL LIGATION  1981   Family History  Problem Relation Age of Onset   Cancer Father 67       lung   Early death Father    Arthritis Mother    Depression Mother    Diabetes Mother    Heart disease Mother    Hypertension Mother    Hyperlipidemia Mother    Stroke Mother    Depression Sister    Diabetes Sister    Hyperlipidemia Sister    Hypertension Sister    Depression Sister    Hyperlipidemia Sister    Hypertension Sister    Miscarriages / Stillbirths Sister    Depression Sister    Hyperlipidemia Sister    Hypertension Sister    Depression Brother    Diabetes Brother    Hyperlipidemia Brother    Hypertension Brother    Stroke Brother    Breast cancer Neg Hx    Ovarian cancer Neg Hx    Social History   Socioeconomic History   Marital status: Married    Spouse name: Not on file   Number of children: Not on file   Years of education: Not  on file   Highest education level: 12th grade  Occupational History   Not on file  Tobacco Use   Smoking status: Never   Smokeless tobacco: Never  Vaping Use   Vaping status: Never Used  Substance and Sexual Activity   Alcohol use: No   Drug use: No   Sexual activity: Never  Other Topics Concern   Not on file  Social History Narrative   Not on file   Social Drivers of Health   Financial Resource Strain: Low Risk  (12/16/2022)   Overall Financial Resource Strain (CARDIA)    Difficulty of Paying Living Expenses: Not very hard  Food Insecurity: No Food Insecurity (12/16/2022)   Hunger Vital Sign    Worried About Running Out of Food in the Last Year: Never true    Ran Out of Food in the Last Year: Never true  Transportation Needs: No Transportation Needs  (12/16/2022)   PRAPARE - Administrator, Civil Service (Medical): No    Lack of Transportation (Non-Medical): No  Physical Activity: Insufficiently Active (12/16/2022)   Exercise Vital Sign    Days of Exercise per Week: 4 days    Minutes of Exercise per Session: 20 min  Stress: No Stress Concern Present (12/16/2022)   [employer] of Occupational Health - Occupational Stress Questionnaire    Feeling of Stress : Only a little  Social Connections: Moderately Integrated (12/16/2022)   Social Connection and Isolation Panel    Frequency of Communication with Friends and Family: Twice a week    Frequency of Social Gatherings with Friends and Family: Once a week    Attends Religious Services: More than 4 times per year    Active Member of [employer] or Organizations: No    Attends Engineer, structural: Not on file    Marital Status: Married     Review of Systems     Objective:     BP 126/72   Pulse 82   Temp 98 F (36.7 C)   Resp 16   Ht 5' 4 (1.626 m)   Wt 188 lb (85.3 kg)   SpO2 98%   BMI 32.27 kg/m  Wt Readings from Last 3 Encounters:  11/25/23 188 lb (85.3 kg)  04/22/23 186 lb (84.4 kg)  12/17/22 186 lb 12.8 oz (84.7 kg)    Physical Exam  {Perform Simple Foot Exam  Perform Detailed exam:1} {Insert foot Exam (Optional):30965}   Outpatient Encounter Medications as of 11/25/2023  Medication Sig   anastrozole (ARIMIDEX) 1 MG tablet Take 1 mg by mouth daily.    aspirin 81 MG tablet Take 81 mg by mouth daily.    fluocinonide  cream (LIDEX ) 0.05 % Use as directed   hydrochlorothiazide  (HYDRODIURIL ) 25 MG tablet TAKE 1 TABLET(25 MG) BY MOUTH DAILY   lisinopril  (ZESTRIL ) 10 MG tablet TAKE 1 TABLET(10 MG) BY MOUTH DAILY   Multiple Vitamins-Minerals (CENTRUM ULTRA WOMENS PO) Take by mouth.   pantoprazole  (PROTONIX ) 40 MG tablet TAKE 1 TABLET(40 MG) BY MOUTH DAILY   potassium chloride  (MICRO-K ) 10 MEQ CR capsule TAKE 2 CAPSULES(20 MEQ) BY MOUTH DAILY (Patient  taking differently: Take 10 mEq by mouth daily.)   rOPINIRole  (REQUIP ) 1 MG tablet TAKE 1 TABLET(1 MG) BY MOUTH AT BEDTIME   rosuvastatin  (CRESTOR ) 20 MG tablet TAKE 1 TABLET(20 MG) BY MOUTH DAILY   No facility-administered encounter medications on file as of 11/25/2023.     Lab Results  Component Value Date   WBC 3.6 (L)  04/22/2023   HGB 13.8 04/22/2023   HCT 39.8 04/22/2023   PLT 168.0 04/22/2023   GLUCOSE 103 (H) 04/22/2023   CHOL 151 04/22/2023   TRIG 70.0 04/22/2023   HDL 53.90 04/22/2023   LDLCALC 83 04/22/2023   ALT 13 04/22/2023   AST 18 04/22/2023   NA 142 04/22/2023   K 4.0 04/22/2023   CL 106 04/22/2023   CREATININE 0.74 04/22/2023   BUN 18 04/22/2023   CO2 28 04/22/2023   TSH 3.59 04/22/2023   HGBA1C 5.8 04/22/2023    No results found.     Assessment & Plan:  Hypercholesterolemia  Hyperglycemia  Primary hypertension  Leukopenia, unspecified type     Dellar Fenton, MD

## 2023-11-26 LAB — LIPID PANEL
Cholesterol: 147 mg/dL (ref 0–200)
HDL: 55.6 mg/dL (ref >39.00)
LDL Cholesterol: 72 mg/dL (ref 0–99)
NonHDL: 91
Total CHOL/HDL Ratio: 3
Triglycerides: 95 mg/dL (ref 0.0–149.0)
VLDL: 19 mg/dL (ref 0.0–40.0)

## 2023-11-26 LAB — HEPATIC FUNCTION PANEL
ALT: 16 U/L (ref 0–35)
AST: 18 U/L (ref 0–37)
Albumin: 4.2 g/dL (ref 3.5–5.2)
Alkaline Phosphatase: 63 U/L (ref 39–117)
Bilirubin, Direct: 0.1 mg/dL (ref 0.0–0.3)
Total Bilirubin: 0.5 mg/dL (ref 0.2–1.2)
Total Protein: 6.3 g/dL (ref 6.0–8.3)

## 2023-11-26 LAB — CBC WITH DIFFERENTIAL/PLATELET
Basophils Absolute: 0 10*3/uL (ref 0.0–0.1)
Basophils Relative: 0.5 % (ref 0.0–3.0)
Eosinophils Absolute: 0 10*3/uL (ref 0.0–0.7)
Eosinophils Relative: 1.1 % (ref 0.0–5.0)
HCT: 40.7 % (ref 36.0–46.0)
Hemoglobin: 13.8 g/dL (ref 12.0–15.0)
Lymphocytes Relative: 19.4 % (ref 12.0–46.0)
Lymphs Abs: 0.9 10*3/uL (ref 0.7–4.0)
MCHC: 34 g/dL (ref 30.0–36.0)
MCV: 85.3 fl (ref 78.0–100.0)
Monocytes Absolute: 0.4 10*3/uL (ref 0.1–1.0)
Monocytes Relative: 9.8 % (ref 3.0–12.0)
Neutro Abs: 3 10*3/uL (ref 1.4–7.7)
Neutrophils Relative %: 69.2 % (ref 43.0–77.0)
Platelets: 175 10*3/uL (ref 150.0–400.0)
RBC: 4.77 Mil/uL (ref 3.87–5.11)
RDW: 14.3 % (ref 11.5–15.5)
WBC: 4.4 10*3/uL (ref 4.0–10.5)

## 2023-11-26 LAB — BASIC METABOLIC PANEL WITH GFR
BUN: 20 mg/dL (ref 6–23)
CO2: 33 meq/L — ABNORMAL HIGH (ref 19–32)
Calcium: 9.3 mg/dL (ref 8.4–10.5)
Chloride: 105 meq/L (ref 96–112)
Creatinine, Ser: 0.73 mg/dL (ref 0.40–1.20)
GFR: 81.58 mL/min (ref >60.00)
Glucose, Bld: 91 mg/dL (ref 70–99)
Potassium: 4.4 meq/L (ref 3.5–5.1)
Sodium: 142 meq/L (ref 135–145)

## 2023-11-26 LAB — HEMOGLOBIN A1C: Hgb A1c MFr Bld: 5.8 % (ref 4.6–6.5)

## 2023-11-27 ENCOUNTER — Ambulatory Visit: Payer: Self-pay | Admitting: Internal Medicine

## 2023-11-30 ENCOUNTER — Encounter: Payer: Self-pay | Admitting: Internal Medicine

## 2023-11-30 NOTE — Assessment & Plan Note (Signed)
Colonoscopy 12/2021 - one 5mm polyp in the cecum- tubular adenoma 

## 2023-11-30 NOTE — Assessment & Plan Note (Signed)
 Low-carb diet and exercise.  Follow met b and A1c.

## 2023-11-30 NOTE — Assessment & Plan Note (Signed)
 Follow cbc.

## 2023-11-30 NOTE — Assessment & Plan Note (Signed)
 On crestor.  Low cholesterol diet and exercise.  Follow lipid panel and liver function tests.

## 2023-11-30 NOTE — Assessment & Plan Note (Signed)
 Has a history of a right breast atypical ductal hyperplasia. She has been on anastrozole for chemoprevention since 2019. Seeing oncology.  Had f/u 08/2022.  Stopped anastrozole - since completed 5 years.  Off anastrozole.  Had f/u with [facility] surgical oncology 09/08/23 - f/u right breast atypical ductal hyperplasia. Stable. Recommended f/u in one year. No chest pain or sob reported. Walking. No abdominal pain o

## 2023-11-30 NOTE — Assessment & Plan Note (Signed)
 Blood pressure as outlined. On lisinopril  and hctz.  No changes in medication. Follow pressures. Follow metabolic panel.

## 2023-11-30 NOTE — Assessment & Plan Note (Signed)
No upper symptoms reported.  On protonix.   

## 2023-11-30 NOTE — Assessment & Plan Note (Signed)
 Low back pain. Discussed PT. Stretches. Follow. Notify if feels needs further intervention.

## 2023-11-30 NOTE — Assessment & Plan Note (Signed)
 Evaluated by rheumatology.  Stable.

## 2024-01-20 ENCOUNTER — Telehealth: Payer: Self-pay

## 2024-01-20 DIAGNOSIS — E538 Deficiency of other specified B group vitamins: Secondary | ICD-10-CM

## 2024-01-20 NOTE — Addendum Note (Signed)
 Addended by: LEARTA PORTO D on: 01/20/2024 04:02 PM   Modules accepted: Orders

## 2024-01-20 NOTE — Telephone Encounter (Signed)
 Patient says that she used to get b12 injections and she just stopped taking them on her own. She says she thinks she may need them again (feeling tired, little energy). Are you ok with me ordering b12 level on her? It has not been checked recently and she is not due for fasting labs until December 2025

## 2024-01-20 NOTE — Telephone Encounter (Signed)
Ok to check B12 level

## 2024-01-20 NOTE — Telephone Encounter (Signed)
 LM for patient. B12 level ordered. Ok to schedule non fasting lab

## 2024-01-20 NOTE — Telephone Encounter (Signed)
 Copied from CRM [id number]. Topic: Appointments - Scheduling Inquiry for Clinic >> Jan 20, 2024 11:49 AM Revonda D wrote: Reason for CRM: Pt is wanting an order submitted to schedule an appt to have a B12 shot.

## 2024-01-21 ENCOUNTER — Ambulatory Visit

## 2024-01-21 ENCOUNTER — Other Ambulatory Visit

## 2024-01-27 ENCOUNTER — Other Ambulatory Visit (INDEPENDENT_AMBULATORY_CARE_PROVIDER_SITE_OTHER)

## 2024-01-27 DIAGNOSIS — E538 Deficiency of other specified B group vitamins: Secondary | ICD-10-CM

## 2024-01-27 LAB — VITAMIN B12: Vitamin B-12: 300 pg/mL (ref 211–911)

## 2024-01-28 ENCOUNTER — Ambulatory Visit: Payer: Self-pay | Admitting: Internal Medicine

## 2024-01-29 ENCOUNTER — Other Ambulatory Visit: Payer: Self-pay | Admitting: Internal Medicine

## 2024-02-23 ENCOUNTER — Ambulatory Visit: Payer: Self-pay

## 2024-02-23 NOTE — Telephone Encounter (Signed)
 FYI Only or Action Required?: FYI only for provider.  Patient was last seen in primary care on 11/25/2023 by Glendia Shad, MD.  Called Nurse Triage reporting Knee Pain.  Symptoms began a week ago.  Interventions attempted: OTC medications: pain patches, Advil and Ice/heat application.  Symptoms are: right knee pain moderate at rest and severe with walking or bearing weight unchanged.  Triage Disposition: See PCP When Office is Open (Within 3 Days)  Patient/caregiver understands and will follow disposition?: Yes         Copied from CRM #[id number]. Topic: Clinical - Red Word Triage >> Feb 23, 2024 10:16 AM Viola FALCON wrote: Red Word that prompted transfer to Nurse Triage: Patient having pain in right knee Reason for Disposition  [1] MODERATE pain (e.g., interferes with normal activities, limping) AND [2] present > 3 days  Answer Assessment - Initial Assessment Questions 1. LOCATION and RADIATION: Where is the pain located?      Right knee, in front.  2. QUALITY: What does the pain feel like?  (e.g., sharp, dull, aching, burning)     Throbbing, burning. Constant and worse when walking or bearing weight.  3. SEVERITY: How bad is the pain? What does it keep you from doing?   (Scale 1-10; or mild, moderate, severe)     8/10 when walking, 6/10 at rest. She states it is hard to sleep at night due to the pain.  4. ONSET: When did the pain start? Does it come and go, or is it there all the time? 1 week, constant.  5. RECURRENT: Have you had this pain before? If Yes, ask: When, and what happened then?     No.  6. SETTING: Has there been any recent work, exercise or other activity that involved that part of the body?      No.  7. AGGRAVATING FACTORS: What makes the knee pain worse? (e.g., walking, climbing stairs, running)     Walking or bearing weight.  8. ASSOCIATED SYMPTOMS: Is there any swelling or redness of the knee?     She states she thinks there  is some swelling (mild) and no redness.  9. OTHER SYMPTOMS: Do you have any other symptoms? (e.g., calf pain, chest pain, difficulty breathing, fever)     Denies chest pain, difficulty breathing, fever, calf pain.  10. PREGNANCY: Is there any chance you are pregnant? When was your last menstrual period?       N/A.  Protocols used: Knee Pain-A-AH

## 2024-02-23 NOTE — Telephone Encounter (Signed)
 Scheduled to see Dr. Marylynn tomorrow

## 2024-02-24 ENCOUNTER — Ambulatory Visit (INDEPENDENT_AMBULATORY_CARE_PROVIDER_SITE_OTHER): Admitting: Internal Medicine

## 2024-02-24 ENCOUNTER — Encounter: Payer: Self-pay | Admitting: Internal Medicine

## 2024-02-24 ENCOUNTER — Ambulatory Visit

## 2024-02-24 VITALS — BP 138/80 | HR 67 | Temp 98.0°F | Ht 64.0 in | Wt 184.0 lb

## 2024-02-24 DIAGNOSIS — E538 Deficiency of other specified B group vitamins: Secondary | ICD-10-CM

## 2024-02-24 DIAGNOSIS — L4050 Arthropathic psoriasis, unspecified: Secondary | ICD-10-CM

## 2024-02-24 DIAGNOSIS — M25561 Pain in right knee: Secondary | ICD-10-CM

## 2024-02-24 DIAGNOSIS — I10 Essential (primary) hypertension: Secondary | ICD-10-CM | POA: Diagnosis not present

## 2024-02-24 LAB — CBC WITH DIFFERENTIAL/PLATELET
Basophils Absolute: 0 K/uL (ref 0.0–0.1)
Basophils Relative: 0.6 % (ref 0.0–3.0)
Eosinophils Absolute: 0.1 K/uL (ref 0.0–0.7)
Eosinophils Relative: 3.3 % (ref 0.0–5.0)
HCT: 38.8 % (ref 36.0–46.0)
Hemoglobin: 13.2 g/dL (ref 12.0–15.0)
Lymphocytes Relative: 24.9 % (ref 12.0–46.0)
Lymphs Abs: 0.7 K/uL (ref 0.7–4.0)
MCHC: 34 g/dL (ref 30.0–36.0)
MCV: 85.7 fl (ref 78.0–100.0)
Monocytes Absolute: 0.3 K/uL (ref 0.1–1.0)
Monocytes Relative: 9 % (ref 3.0–12.0)
Neutro Abs: 1.8 K/uL (ref 1.4–7.7)
Neutrophils Relative %: 62.2 % (ref 43.0–77.0)
Platelets: 144 K/uL — ABNORMAL LOW (ref 150.0–400.0)
RBC: 4.53 Mil/uL (ref 3.87–5.11)
RDW: 14.3 % (ref 11.5–15.5)
WBC: 2.9 K/uL — ABNORMAL LOW (ref 4.0–10.5)

## 2024-02-24 LAB — SEDIMENTATION RATE: Sed Rate: 1 mm/h (ref 0–30)

## 2024-02-24 LAB — C-REACTIVE PROTEIN: CRP: <1 mg/dL (ref 0.5–20.0)

## 2024-02-24 LAB — VITAMIN B12: Vitamin B-12: 607 pg/mL (ref 211–911)

## 2024-02-24 NOTE — Assessment & Plan Note (Signed)
 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  Did not tolerate leflunomide,  did not start Humira due to fear of S/E.  Checking inflammatory markers and knee films today

## 2024-02-24 NOTE — Assessment & Plan Note (Signed)
 Ddx inlcudes ligament sprain. DJD vs flare of psoriatic arthritis which has historically affected hands and elbows.      Her inflammatory markers are normal .  I have reviewed the knee films and see mild loss of cartilage in the medial compartment   .  She has a normal GFR, I recommend use of  aleve and tylenol   for now.  Lab Results  Component Value Date   ESRSEDRATE 1 02/24/2024   Lab Results  Component Value Date   CRP <1.0 02/24/2024

## 2024-02-24 NOTE — Progress Notes (Unsigned)
 Subjective:  Patient ID: Melissa Rhodes, female    DOB: 1951/06/05  Age: 73 y.o. MRN: [id number]  CC: The primary encounter diagnosis was Acute pain of right knee. Diagnoses of B12 deficiency, Psoriatic arthritis (HCC), and Primary hypertension were also pertinent to this visit.   HPI Melissa Rhodes presents for  Chief Complaint  Patient presents with   Knee Pain    Right knee pain worse when walking.    Melissa Rhodes is  a 73 yr old female with osteoarthritis, prediabetes, obesity , hypertension and CKD who presents with right knee pain for the past 9 days.    the pain localizes to the medial side of her right knee and is  is accompanied by redness and swelling to a mild degree.  The pain is made worse  with weight bearing  and was a gradual  occurrence.  Thre is no history of recent falls ,or unusual activity,.  The pain has caused her to be less active to due to severity  Has tried topical therapies : aspercreme and Hempvanna  neither provided any relief,  no relief with 220 mg aleve. (Has not used more due to h/o CKD) .  Has not tried tyenol.   She has a history of psoriatic arthritis which has been untreated for over a year due to medication intolerance. She was diagnosed by [facility] clinic but did not tolerate lefluonomide and did not start Humira.  She has not seen Rheum  since Nov 22 .  Per review of Nov 2022 Rheuma notes,  the joints involved were the   Inner Ear and Elbow with Psoriasis, Right 4th Digit with dactylitis, MCP and PIP joints with synovitis per  Dr Tobie.     Outpatient Medications Prior to Visit  Medication Sig Dispense Refill   anastrozole (ARIMIDEX) 1 MG tablet Take 1 mg by mouth daily.      aspirin 81 MG tablet Take 81 mg by mouth daily.      cyanocobalamin  (VITAMIN B12) 1000 MCG tablet Take 1,000 mcg by mouth daily.     fluocinonide  cream (LIDEX ) 0.05 % Use as directed 30 g 0   hydrochlorothiazide  (HYDRODIURIL ) 25 MG tablet TAKE 1 TABLET(25 MG) BY MOUTH DAILY  90 tablet 3   lisinopril  (ZESTRIL ) 10 MG tablet TAKE 1 TABLET(10 MG) BY MOUTH DAILY 90 tablet 1   Multiple Vitamins-Minerals (CENTRUM ULTRA WOMENS PO) Take by mouth.     pantoprazole  (PROTONIX ) 40 MG tablet TAKE 1 TABLET(40 MG) BY MOUTH DAILY 90 tablet 3   rOPINIRole  (REQUIP ) 1 MG tablet TAKE 1 TABLET(1 MG) BY MOUTH AT BEDTIME 90 tablet 3   rosuvastatin  (CRESTOR ) 20 MG tablet TAKE 1 TABLET(20 MG) BY MOUTH DAILY 90 tablet 3   potassium chloride  (MICRO-K ) 10 MEQ CR capsule TAKE 2 CAPSULES(20 MEQ) BY MOUTH DAILY (Patient taking differently: Take 10 mEq by mouth daily.) 180 capsule 3   No facility-administered medications prior to visit.    Review of Systems;  Patient denies headache, fevers, malaise, unintentional weight loss, skin rash, eye pain, sinus congestion and sinus pain, sore throat, dysphagia,  hemoptysis , cough, dyspnea, wheezing, chest pain, palpitations, orthopnea, edema, abdominal pain, nausea, melena, diarrhea, constipation, flank pain, dysuria, hematuria, urinary  Frequency, nocturia, numbness, tingling, seizures,  Focal weakness, Loss of consciousness,  Tremor, insomnia, depression, anxiety, and suicidal ideation.      Objective:  BP 138/80   Pulse 67   Temp 98 F (36.7 C) (Oral)   Ht 5' 4 (  1.626 m)   Wt 184 lb (83.5 kg)   SpO2 98%   BMI 31.58 kg/m   BP Readings from Last 3 Encounters:  02/24/24 138/80  11/25/23 126/72  04/22/23 118/72    Wt Readings from Last 3 Encounters:  02/24/24 184 lb (83.5 kg)  11/25/23 188 lb (85.3 kg)  04/22/23 186 lb (84.4 kg)    Physical Exam Vitals reviewed.  Constitutional:      General: She is not in acute distress.    Appearance: Normal appearance. She is obese. She is not ill-appearing, toxic-appearing or diaphoretic.  HENT:     Head: Normocephalic.  Eyes:     General: No scleral icterus.       Right eye: No discharge.        Left eye: No discharge.     Conjunctiva/sclera: Conjunctivae normal.  Cardiovascular:      Rate and Rhythm: Normal rate and regular rhythm.     Heart sounds: Normal heart sounds.  Pulmonary:     Effort: Pulmonary effort is normal. No respiratory distress.     Breath sounds: Normal breath sounds.  Musculoskeletal:        General: Swelling present. Normal range of motion.     Right hand: Swelling and deformity present.     Right knee: Swelling present. No erythema. Normal range of motion. Tenderness present over the medial joint line and lateral joint line.     Comments: Swelling , diffuse of 3rd and 4th fingers of right hand   Skin:    General: Skin is warm and dry.  Neurological:     General: No focal deficit present.     Mental Status: She is alert and oriented to person, place, and time. Mental status is at baseline.  Psychiatric:        Mood and Affect: Mood normal.        Behavior: Behavior normal.        Thought Content: Thought content normal.        Judgment: Judgment normal.     Lab Results  Component Value Date   HGBA1C 5.8 11/25/2023   HGBA1C 5.8 04/22/2023   HGBA1C 5.8 12/26/2022    Lab Results  Component Value Date   CREATININE 0.73 11/25/2023   CREATININE 0.74 04/22/2023   CREATININE 0.75 12/26/2022    Lab Results  Component Value Date   WBC 2.9 (L) 02/24/2024   HGB 13.2 02/24/2024   HCT 38.8 02/24/2024   PLT 144.0 (L) 02/24/2024   GLUCOSE 91 11/25/2023   CHOL 147 11/25/2023   TRIG 95.0 11/25/2023   HDL 55.60 11/25/2023   LDLCALC 72 11/25/2023   ALT 16 11/25/2023   AST 18 11/25/2023   NA 142 11/25/2023   K 4.4 11/25/2023   CL 105 11/25/2023   CREATININE 0.73 11/25/2023   BUN 20 11/25/2023   CO2 33 (H) 11/25/2023   TSH 3.59 04/22/2023   HGBA1C 5.8 11/25/2023    No results found.  Assessment & Plan:  .Acute pain of right knee Assessment & Plan: Ddx inlcudes ligament sprain. DJD vs flare of psoriatic arthritis which has historically affected hands and elbows.      Her inflammatory markers are normal .  I have reviewed the knee  films and see mild loss of cartilage in the medial compartment   .  She has a normal GFR, I recommend use of  aleve and tylenol   for now.  Lab Results  Component Value Date   ESRSEDRATE  1 02/24/2024   Lab Results  Component Value Date   CRP <1.0 02/24/2024     Orders: -     DG Knee Complete 4 Views Right; Future  B12 deficiency -     Vitamin B12  Psoriatic arthritis (HCC) Assessment & Plan: Diagnosed by Rheumatology at [facility] in 2020 with leukopenia, synovitis and dactylitis.  Did not tolerate leflunomide,  did not start Humira due to fear of S/E.  Checking inflammatory markers and knee films today   Orders: -     Sedimentation rate -     C-reactive protein -     DG Knee Complete 4 Views Right; Future -     CBC with Differential/Platelet  Primary hypertension Assessment & Plan: Elevation today is due to pain and use of NSAIDs.   she reports compliance with medication regimen  .  She has been asked to check her  BP  at home and  submit readings for evaluation. Renal function, electrolytes and screen for proteinuria are all normal .     I personally spent a total of 30 minutes in the care of the patient today including preparing to see the patient, getting/reviewing separately obtained history, performing a medically appropriate exam/evaluation, placing orders, documenting clinical information in the EHR, independently interpreting results, and communicating results.   Follow-up: No follow-ups on file.   Verneita LITTIE Kettering, MD

## 2024-02-24 NOTE — Assessment & Plan Note (Signed)
 Elevation today is due to pain and use of NSAIDs.   she reports compliance with medication regimen  .  She has been asked to check her  BP  at home and  submit readings for evaluation. Renal function, electrolytes and screen for proteinuria are all normal .

## 2024-02-24 NOTE — Patient Instructions (Addendum)
 Your knee pain may be due to degenerative joint disease  of  the  joint or a flare of your psoriatic arthritis  For now You can take 1 aleve plus 1000 mg  of tylenol  every 12 hours,    and you may take a another dose of tylenol  in between   X rays today.    If the labs suggest that this is due to psoriatic arthritis,  we will try a prednisone  taper

## 2024-02-25 ENCOUNTER — Other Ambulatory Visit: Payer: Self-pay

## 2024-02-25 ENCOUNTER — Ambulatory Visit: Payer: Self-pay | Admitting: Internal Medicine

## 2024-02-25 DIAGNOSIS — M25561 Pain in right knee: Secondary | ICD-10-CM

## 2024-02-25 DIAGNOSIS — D72819 Decreased white blood cell count, unspecified: Secondary | ICD-10-CM

## 2024-03-09 ENCOUNTER — Encounter: Payer: Self-pay | Admitting: Internal Medicine

## 2024-03-09 DIAGNOSIS — M1711 Unilateral primary osteoarthritis, right knee: Secondary | ICD-10-CM | POA: Diagnosis not present

## 2024-03-18 ENCOUNTER — Ambulatory Visit: Payer: Self-pay | Admitting: Internal Medicine

## 2024-03-18 ENCOUNTER — Other Ambulatory Visit (INDEPENDENT_AMBULATORY_CARE_PROVIDER_SITE_OTHER)

## 2024-03-18 DIAGNOSIS — D72819 Decreased white blood cell count, unspecified: Secondary | ICD-10-CM | POA: Diagnosis not present

## 2024-03-18 LAB — CBC WITH DIFFERENTIAL/PLATELET
Basophils Absolute: 0 K/uL (ref 0.0–0.1)
Basophils Relative: 0.5 % (ref 0.0–3.0)
Eosinophils Absolute: 0.1 K/uL (ref 0.0–0.7)
Eosinophils Relative: 2.1 % (ref 0.0–5.0)
HCT: 40.3 % (ref 36.0–46.0)
Hemoglobin: 13.4 g/dL (ref 12.0–15.0)
Lymphocytes Relative: 24.4 % (ref 12.0–46.0)
Lymphs Abs: 0.9 K/uL (ref 0.7–4.0)
MCHC: 33.1 g/dL (ref 30.0–36.0)
MCV: 87.7 fl (ref 78.0–100.0)
Monocytes Absolute: 0.3 K/uL (ref 0.1–1.0)
Monocytes Relative: 9.2 % (ref 3.0–12.0)
Neutro Abs: 2.4 K/uL (ref 1.4–7.7)
Neutrophils Relative %: 63.8 % (ref 43.0–77.0)
Platelets: 167 K/uL (ref 150.0–400.0)
RBC: 4.59 Mil/uL (ref 3.87–5.11)
RDW: 14.6 % (ref 11.5–15.5)
WBC: 3.7 K/uL — ABNORMAL LOW (ref 4.0–10.5)

## 2024-04-29 DIAGNOSIS — D2272 Melanocytic nevi of left lower limb, including hip: Secondary | ICD-10-CM | POA: Diagnosis not present

## 2024-04-29 DIAGNOSIS — L821 Other seborrheic keratosis: Secondary | ICD-10-CM | POA: Diagnosis not present

## 2024-04-29 DIAGNOSIS — D2262 Melanocytic nevi of left upper limb, including shoulder: Secondary | ICD-10-CM | POA: Diagnosis not present

## 2024-04-29 DIAGNOSIS — D225 Melanocytic nevi of trunk: Secondary | ICD-10-CM | POA: Diagnosis not present

## 2024-04-29 DIAGNOSIS — L820 Inflamed seborrheic keratosis: Secondary | ICD-10-CM | POA: Diagnosis not present

## 2024-04-29 DIAGNOSIS — R208 Other disturbances of skin sensation: Secondary | ICD-10-CM | POA: Diagnosis not present

## 2024-04-29 DIAGNOSIS — D2271 Melanocytic nevi of right lower limb, including hip: Secondary | ICD-10-CM | POA: Diagnosis not present

## 2024-04-29 DIAGNOSIS — Z85820 Personal history of malignant melanoma of skin: Secondary | ICD-10-CM | POA: Diagnosis not present

## 2024-04-29 DIAGNOSIS — L538 Other specified erythematous conditions: Secondary | ICD-10-CM | POA: Diagnosis not present

## 2024-04-29 DIAGNOSIS — D2261 Melanocytic nevi of right upper limb, including shoulder: Secondary | ICD-10-CM | POA: Diagnosis not present

## 2024-05-25 ENCOUNTER — Encounter: Payer: Self-pay | Admitting: Internal Medicine

## 2024-05-25 ENCOUNTER — Ambulatory Visit (INDEPENDENT_AMBULATORY_CARE_PROVIDER_SITE_OTHER): Admitting: Internal Medicine

## 2024-05-25 VITALS — BP 112/70 | HR 57 | Temp 98.3°F | Ht 64.0 in | Wt 174.6 lb

## 2024-05-25 DIAGNOSIS — Z23 Encounter for immunization: Secondary | ICD-10-CM

## 2024-05-25 DIAGNOSIS — Z1231 Encounter for screening mammogram for malignant neoplasm of breast: Secondary | ICD-10-CM

## 2024-05-25 DIAGNOSIS — R251 Tremor, unspecified: Secondary | ICD-10-CM | POA: Diagnosis not present

## 2024-05-25 DIAGNOSIS — D72819 Decreased white blood cell count, unspecified: Secondary | ICD-10-CM

## 2024-05-25 DIAGNOSIS — R739 Hyperglycemia, unspecified: Secondary | ICD-10-CM | POA: Diagnosis not present

## 2024-05-25 DIAGNOSIS — R634 Abnormal weight loss: Secondary | ICD-10-CM | POA: Diagnosis not present

## 2024-05-25 DIAGNOSIS — E7800 Pure hypercholesterolemia, unspecified: Secondary | ICD-10-CM

## 2024-05-25 DIAGNOSIS — N6099 Unspecified benign mammary dysplasia of unspecified breast: Secondary | ICD-10-CM | POA: Diagnosis not present

## 2024-05-25 DIAGNOSIS — D696 Thrombocytopenia, unspecified: Secondary | ICD-10-CM

## 2024-05-25 DIAGNOSIS — Z0000 Encounter for general adult medical examination without abnormal findings: Secondary | ICD-10-CM

## 2024-05-25 DIAGNOSIS — K219 Gastro-esophageal reflux disease without esophagitis: Secondary | ICD-10-CM | POA: Diagnosis not present

## 2024-05-25 DIAGNOSIS — I10 Essential (primary) hypertension: Secondary | ICD-10-CM

## 2024-05-25 DIAGNOSIS — E2839 Other primary ovarian failure: Secondary | ICD-10-CM

## 2024-05-25 MED ORDER — LISINOPRIL 10 MG PO TABS: 10.0000 mg | ORAL_TABLET | Freq: Every day | ORAL | 1 refills | Status: DC

## 2024-05-25 NOTE — Assessment & Plan Note (Signed)
 Physical today 05/25/24.  Colonoscopy 12/2021 - one 5mm cecal polyp.  Mammogram 09/08/23- Birads II.

## 2024-05-25 NOTE — Progress Notes (Unsigned)
 Subjective:    Patient ID: Melissa Rhodes, female    DOB: 09/23/1950, 73 y.o.   MRN: [id number]  Patient here for  Chief Complaint  Patient presents with   Medical Management of Chronic Issues   Annual Exam    HPI Here for a physical exam. Saw ortho 03/09/24 - right knee OA. S/p injection. Prescribed meloxicam.  Had f/u with [facility] surgical oncology 09/08/23 - f/u right breast atypical ductal hyperplasia. Stable. Recommended f/u in one year.    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   left   Cancer (HCC) 2019   colon   GERD (gastroesophageal reflux disease)    History of chicken pox    History of kidney stones    Hypertension 2010   Mammographic microcalcification 2013   Melanoma (HCC) 2019   left arm   Mitral valve prolapse 2006   Neoplasm of uncertain behavior of breast 2014   left   Neutropenia    Obesity, unspecified 2014   Personal history of colonic polyps 2013   Restless leg syndrome    Special screening for malignant neoplasms, colon 2014   Tubal pregnancy    Past Surgical History:  Procedure Laterality Date   BREAST BIOPSY Left 2013   BREAST BIOPSY Right 03/06/2017   LIQ coil clip. FEATURES OF RADIAL SCAR   BREAST SURGERY Left 03/06/2017   re excision of left breast showed small area of residual ADH, but she was not upsatged to DCIS or invasive cancer. The margins were clear.  Patient declined tamoxifen  therapy   COLONOSCOPY  2006,2014   COLONOSCOPY W/ POLYPECTOMY  2014   COLONOSCOPY WITH PROPOFOL  N/A 11/19/2017   Procedure: COLONOSCOPY WITH PROPOFOL ;  Surgeon: Dessa Reyes ORN, MD;  Location: [facility] ENDOSCOPY;  Service: Endoscopy;  Laterality: N/A;   COLONOSCOPY WITH PROPOFOL  N/A 01/02/2022   Procedure: COLONOSCOPY WITH PROPOFOL ;  Surgeon: Dessa Reyes ORN, MD;  Location: [facility] ENDOSCOPY;  Service: Endoscopy;  Laterality: N/A;   ECTOPIC PREGNANCY SURGERY  1981   ESOPHAGOGASTRODUODENOSCOPY  (EGD) WITH PROPOFOL  N/A 11/19/2017   Procedure: ESOPHAGOGASTRODUODENOSCOPY (EGD) WITH PROPOFOL ;  Surgeon: Dessa Reyes ORN, MD;  Location: [facility] ENDOSCOPY;  Service: Endoscopy;  Laterality: N/A;   kidney stones  2009   SHOULDER SURGERY Right 2007   TUBAL LIGATION  1981   Family History  Problem Relation Age of Onset   Cancer Father 45       lung   Early death Father    Arthritis Mother    Depression Mother    Diabetes Mother    Heart disease Mother    Hypertension Mother    Hyperlipidemia Mother    Stroke Mother    Anxiety disorder Mother    Obesity Mother    Depression Sister    Diabetes Sister    Hyperlipidemia Sister    Hypertension Sister    Obesity Sister    Depression Sister    Hyperlipidemia Sister    Hypertension Sister    Miscarriages / Stillbirths Sister    Diabetes Sister    Obesity Sister    Depression Sister    Hyperlipidemia Sister    Hypertension Sister    Miscarriages / Stillbirths Sister    Depression Brother    Diabetes Brother    Hyperlipidemia Brother    Hypertension Brother    Stroke Brother    Heart disease Brother    Kidney disease Brother    Breast cancer  Neg Hx    Ovarian cancer Neg Hx    Social History   Socioeconomic History   Marital status: Married    Spouse name: Not on file   Number of children: Not on file   Years of education: Not on file   Highest education level: 12th grade  Occupational History   Not on file  Tobacco Use   Smoking status: Never   Smokeless tobacco: Never  Vaping Use   Vaping status: Never Used  Substance and Sexual Activity   Alcohol use: No   Drug use: No   Sexual activity: Never  Other Topics Concern   Not on file  Social History Narrative   Not on file   Social Drivers of Health   Tobacco Use: Low Risk (05/25/2024)   Patient History    Smoking Tobacco Use: Never    Smokeless Tobacco Use: Never    Passive Exposure: Not on file  Financial Resource Strain: Low Risk (12/16/2022)   Overall  Financial Resource Strain (CARDIA)    Difficulty of Paying Living Expenses: Not very hard  Food Insecurity: No Food Insecurity (12/16/2022)   Hunger Vital Sign    Worried About Running Out of Food in the Last Year: Never true    Ran Out of Food in the Last Year: Never true  Transportation Needs: No Transportation Needs (12/16/2022)   PRAPARE - Administrator, Civil Service (Medical): No    Lack of Transportation (Non-Medical): No  Physical Activity: Insufficiently Active (12/16/2022)   Exercise Vital Sign    Days of Exercise per Week: 4 days    Minutes of Exercise per Session: 20 min  Stress: No Stress Concern Present (12/16/2022)   [employer] of Occupational Health - Occupational Stress Questionnaire    Feeling of Stress : Only a little  Social Connections: Moderately Integrated (12/16/2022)   Social Connection and Isolation Panel    Frequency of Communication with Friends and Family: Twice a week    Frequency of Social Gatherings with Friends and Family: Once a week    Attends Religious Services: More than 4 times per year    Active Member of Clubs or Organizations: No    Attends Banker Meetings: Not on file    Marital Status: Married  Depression (PHQ2-9): Low Risk (05/25/2024)   Depression (PHQ2-9)    PHQ-2 Score: 2  Alcohol Screen: Not on file  Housing: Low Risk (12/16/2022)   Housing    Last Housing Risk Score: 0  Utilities: Not on file  Health Literacy: Not on file     Review of Systems     Objective:     BP 112/70   Pulse (!) 57   Temp 98.3 F (36.8 C) (Oral)   Ht 5' 4 (1.626 m)   Wt 174 lb 9.6 oz (79.2 kg)   SpO2 97%   BMI 29.97 kg/m  Wt Readings from Last 3 Encounters:  05/25/24 174 lb 9.6 oz (79.2 kg)  02/24/24 184 lb (83.5 kg)  11/25/23 188 lb (85.3 kg)    Physical Exam  {Perform Simple Foot Exam  Perform Detailed exam:1} {Insert foot Exam (Optional):30965}   Outpatient Encounter Medications as of 05/25/2024  Medication  Sig   aspirin 81 MG tablet Take 81 mg by mouth daily.    cyanocobalamin  (VITAMIN B12) 1000 MCG tablet Take 1,000 mcg by mouth daily.   fluocinonide  cream (LIDEX ) 0.05 % Use as directed   hydrochlorothiazide  (HYDRODIURIL ) 25 MG tablet TAKE  1 TABLET(25 MG) BY MOUTH DAILY   rosuvastatin  (CRESTOR ) 20 MG tablet TAKE 1 TABLET(20 MG) BY MOUTH DAILY   [DISCONTINUED] lisinopril  (ZESTRIL ) 10 MG tablet TAKE 1 TABLET(10 MG) BY MOUTH DAILY   anastrozole (ARIMIDEX) 1 MG tablet Take 1 mg by mouth daily.    lisinopril  (ZESTRIL ) 10 MG tablet Take 1 tablet (10 mg total) by mouth daily.   pantoprazole  (PROTONIX ) 40 MG tablet TAKE 1 TABLET(40 MG) BY MOUTH DAILY   rOPINIRole  (REQUIP ) 1 MG tablet TAKE 1 TABLET(1 MG) BY MOUTH AT BEDTIME   [DISCONTINUED] meloxicam (MOBIC) 15 MG tablet Take 15 mg by mouth daily.   [DISCONTINUED] Multiple Vitamins-Minerals (CENTRUM ULTRA WOMENS PO) Take by mouth.   No facility-administered encounter medications on file as of 05/25/2024.     Lab Results  Component Value Date   WBC 3.7 (L) 03/18/2024   HGB 13.4 03/18/2024   HCT 40.3 03/18/2024   PLT 167.0 03/18/2024   GLUCOSE 91 11/25/2023   CHOL 147 11/25/2023   TRIG 95.0 11/25/2023   HDL 55.60 11/25/2023   LDLCALC 72 11/25/2023   ALT 16 11/25/2023   AST 18 11/25/2023   NA 142 11/25/2023   K 4.4 11/25/2023   CL 105 11/25/2023   CREATININE 0.73 11/25/2023   BUN 20 11/25/2023   CO2 33 (H) 11/25/2023   TSH 3.59 04/22/2023   HGBA1C 5.8 11/25/2023    No results found.     Assessment & Plan:  Healthcare maintenance Assessment & Plan: Physical today 05/25/24.  Colonoscopy 12/2021 - one 5mm cecal polyp.  Mammogram 09/08/23- Birads II.    Hypercholesterolemia -     Hepatic function panel -     TSH -     Lipid panel  Hyperglycemia -     Hemoglobin A1c  Primary hypertension -     Basic metabolic panel with GFR  Leukopenia, unspecified type -     CBC with Differential/Platelet  Need for influenza vaccination -      Flu vaccine HIGH DOSE PF(Fluzone Trivalent)  Estrogen deficiency -     DG Bone Density; Future  Encounter for screening mammogram for malignant neoplasm of breast -     3D Screening Mammogram, Left and Right; Future  Other orders -     Lisinopril ; Take 1 tablet (10 mg total) by mouth daily.  Dispense: 90 tablet; Refill: 1     Allena Hamilton, MD

## 2024-05-26 LAB — CBC WITH DIFFERENTIAL/PLATELET
Basophils Absolute: 0 K/uL (ref 0.0–0.1)
Basophils Relative: 0.4 % (ref 0.0–3.0)
Eosinophils Absolute: 0 K/uL (ref 0.0–0.7)
Eosinophils Relative: 0.9 % (ref 0.0–5.0)
HCT: 40.2 % (ref 36.0–46.0)
Hemoglobin: 14 g/dL (ref 12.0–15.0)
Lymphocytes Relative: 17.8 % (ref 12.0–46.0)
Lymphs Abs: 1 K/uL (ref 0.7–4.0)
MCHC: 34.7 g/dL (ref 30.0–36.0)
MCV: 85.9 fl (ref 78.0–100.0)
Monocytes Absolute: 0.5 K/uL (ref 0.1–1.0)
Monocytes Relative: 9.6 % (ref 3.0–12.0)
Neutro Abs: 3.9 K/uL (ref 1.4–7.7)
Neutrophils Relative %: 71.3 % (ref 43.0–77.0)
Platelets: 164 K/uL (ref 150.0–400.0)
RBC: 4.68 Mil/uL (ref 3.87–5.11)
RDW: 13.8 % (ref 11.5–15.5)
WBC: 5.4 K/uL (ref 4.0–10.5)

## 2024-05-26 LAB — HEPATIC FUNCTION PANEL
ALT: 14 U/L (ref 3–35)
AST: 16 U/L (ref 5–37)
Albumin: 4.3 g/dL (ref 3.5–5.2)
Alkaline Phosphatase: 59 U/L (ref 39–117)
Bilirubin, Direct: 0.1 mg/dL (ref 0.1–0.3)
Total Bilirubin: 0.6 mg/dL (ref 0.2–1.2)
Total Protein: 6 g/dL (ref 6.0–8.3)

## 2024-05-26 LAB — BASIC METABOLIC PANEL WITH GFR
BUN: 17 mg/dL (ref 6–23)
CO2: 31 meq/L (ref 19–32)
Calcium: 9.3 mg/dL (ref 8.4–10.5)
Chloride: 102 meq/L (ref 96–112)
Creatinine, Ser: 0.68 mg/dL (ref 0.40–1.20)
GFR: 86.1 mL/min (ref >60.00)
Glucose, Bld: 91 mg/dL (ref 70–99)
Potassium: 3.8 meq/L (ref 3.5–5.1)
Sodium: 142 meq/L (ref 135–145)

## 2024-05-26 LAB — HEMOGLOBIN A1C: Hgb A1c MFr Bld: 5.6 % (ref 4.6–6.5)

## 2024-05-26 LAB — LIPID PANEL
Cholesterol: 130 mg/dL (ref 28–200)
HDL: 51.3 mg/dL (ref >39.00)
LDL Cholesterol: 64 mg/dL (ref 10–99)
NonHDL: 78.72
Total CHOL/HDL Ratio: 3
Triglycerides: 75 mg/dL (ref 10.0–149.0)
VLDL: 15 mg/dL (ref 0.0–40.0)

## 2024-05-26 LAB — TSH: TSH: 2.92 u[IU]/mL (ref 0.35–5.50)

## 2024-05-27 ENCOUNTER — Ambulatory Visit: Payer: Self-pay | Admitting: Internal Medicine

## 2024-05-30 ENCOUNTER — Encounter: Payer: Self-pay | Admitting: Internal Medicine

## 2024-05-30 NOTE — Assessment & Plan Note (Signed)
 Has a history of a right breast atypical ductal hyperplasia. She has been on anastrozole for chemoprevention since 2019. Seeing oncology.  Had f/u 08/2022.  Stopped anastrozole - since completed 5 years.  Off anastrozole.  Had f/u with [facility] surgical oncology 09/08/23 - f/u right breast atypical ductal hyperplasia. Stable. Recommended f/u in one year.

## 2024-05-30 NOTE — Assessment & Plan Note (Signed)
 Increased tremor. Refer to neurology for further evaluation.

## 2024-05-30 NOTE — Assessment & Plan Note (Signed)
 Off protonix . No increased acid reflux reported. Does report recently noticing decreased appetite. Increased weight loss. Will restart protonix . Follow.

## 2024-05-30 NOTE — Assessment & Plan Note (Signed)
 On crestor.  Follow lipid panel and liver function tests.

## 2024-05-30 NOTE — Assessment & Plan Note (Signed)
 Recheck cbc today.

## 2024-05-30 NOTE — Assessment & Plan Note (Signed)
 Not actively trying to lose weight. Decreased appetite. Discussed keeping bowels moving. Discussed protein intake. Check labs, including cbc, metabolic panel and thyroid  test. Also restart protonix . Follow closely. Discussed scanning given unintentional weight loss. Get her back in soon to reassess.

## 2024-05-30 NOTE — Assessment & Plan Note (Addendum)
 Recheck cbc today.

## 2024-05-30 NOTE — Assessment & Plan Note (Signed)
 Follow met b and A1c.

## 2024-05-30 NOTE — Assessment & Plan Note (Signed)
 Followed by dermatology

## 2024-05-30 NOTE — Assessment & Plan Note (Signed)
 Blood pressure as outlined. Continue hydrochlorothiazide  and lisinopril . Follow pressures. Follow metabolic panel.

## 2024-05-30 NOTE — Assessment & Plan Note (Signed)
Colonoscopy 12/2021 - one 5mm polyp in the cecum- tubular adenoma 

## 2024-06-30 ENCOUNTER — Encounter: Payer: Self-pay | Admitting: Internal Medicine

## 2024-06-30 ENCOUNTER — Ambulatory Visit: Admitting: Internal Medicine

## 2024-06-30 ENCOUNTER — Telehealth: Payer: Self-pay

## 2024-06-30 VITALS — BP 118/70 | HR 64 | Temp 98.3°F | Ht 64.0 in | Wt 169.2 lb

## 2024-06-30 DIAGNOSIS — N6099 Unspecified benign mammary dysplasia of unspecified breast: Secondary | ICD-10-CM

## 2024-06-30 DIAGNOSIS — Z860100 Personal history of colon polyps, unspecified: Secondary | ICD-10-CM | POA: Diagnosis not present

## 2024-06-30 DIAGNOSIS — D72819 Decreased white blood cell count, unspecified: Secondary | ICD-10-CM

## 2024-06-30 DIAGNOSIS — R739 Hyperglycemia, unspecified: Secondary | ICD-10-CM | POA: Diagnosis not present

## 2024-06-30 DIAGNOSIS — K219 Gastro-esophageal reflux disease without esophagitis: Secondary | ICD-10-CM

## 2024-06-30 DIAGNOSIS — E7800 Pure hypercholesterolemia, unspecified: Secondary | ICD-10-CM | POA: Diagnosis not present

## 2024-06-30 DIAGNOSIS — I10 Essential (primary) hypertension: Secondary | ICD-10-CM

## 2024-06-30 MED ORDER — LISINOPRIL 10 MG PO TABS: 10.0000 mg | ORAL_TABLET | Freq: Every day | ORAL | 1 refills | Status: AC

## 2024-06-30 MED ORDER — PANTOPRAZOLE SODIUM 40 MG PO TBEC: DELAYED_RELEASE_TABLET | ORAL | 1 refills | Status: AC

## 2024-06-30 MED ORDER — HYDROCHLOROTHIAZIDE 25 MG PO TABS: 25.0000 mg | ORAL_TABLET | Freq: Every day | ORAL | 1 refills | Status: AC

## 2024-06-30 MED ORDER — ROSUVASTATIN CALCIUM 20 MG PO TABS: 20.0000 mg | ORAL_TABLET | Freq: Every day | ORAL | 1 refills | Status: AC

## 2024-06-30 NOTE — Progress Notes (Signed)
 "  Subjective:    Patient ID: Melissa Rhodes, female    DOB: March 17, 1951, 74 y.o.   MRN: [id number]  Patient here for  Chief Complaint  Patient presents with   Medical Management of Chronic Issues    HPI Here for a scheduled follow up.  Saw ortho 03/09/24 - right knee OA. S/p injection. Prescribed meloxicam.  Had f/u with [facility] surgical oncology 09/08/23 - f/u right breast atypical ductal hyperplasia. Stable. Recommended f/u in one year. Last visit, concern regarding weight loss. She feels better. Taking protonix . Appetite better. Eating better. Discussed following weight at home. Taking stool softener- bowel movement every other day. Weight at home 168lbs. No chest pain or sob reported. No abdominal pain reported.    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   left   Cancer (HCC) 2019   colon   GERD (gastroesophageal reflux disease)    History of chicken pox    History of kidney stones    Hypertension 2010   Mammographic microcalcification 2013   Melanoma (HCC) 2019   left arm   Mitral valve prolapse 2006   Neoplasm of uncertain behavior of breast 2014   left   Neutropenia    Obesity, unspecified 2014   Personal history of colonic polyps 2013   Restless leg syndrome    Special screening for malignant neoplasms, colon 2014   Tubal pregnancy    Past Surgical History:  Procedure Laterality Date   BREAST BIOPSY Left 2013   BREAST BIOPSY Right 03/06/2017   LIQ coil clip. FEATURES OF RADIAL SCAR   BREAST SURGERY Left 03/06/2017   re excision of left breast showed small area of residual ADH, but she was not upsatged to DCIS or invasive cancer. The margins were clear.  Patient declined tamoxifen  therapy   COLONOSCOPY  2006,2014   COLONOSCOPY W/ POLYPECTOMY  2014   COLONOSCOPY WITH PROPOFOL  N/A 11/19/2017   Procedure: COLONOSCOPY WITH PROPOFOL ;  Surgeon: Dessa Reyes ORN, MD;  Location: [facility] ENDOSCOPY;  Service:  Endoscopy;  Laterality: N/A;   COLONOSCOPY WITH PROPOFOL  N/A 01/02/2022   Procedure: COLONOSCOPY WITH PROPOFOL ;  Surgeon: Dessa Reyes ORN, MD;  Location: [facility] ENDOSCOPY;  Service: Endoscopy;  Laterality: N/A;   ECTOPIC PREGNANCY SURGERY  1981   ESOPHAGOGASTRODUODENOSCOPY (EGD) WITH PROPOFOL  N/A 11/19/2017   Procedure: ESOPHAGOGASTRODUODENOSCOPY (EGD) WITH PROPOFOL ;  Surgeon: Dessa Reyes ORN, MD;  Location: [facility] ENDOSCOPY;  Service: Endoscopy;  Laterality: N/A;   kidney stones  2009   SHOULDER SURGERY Right 2007   TUBAL LIGATION  1981   Family History  Problem Relation Age of Onset   Cancer Father 57       lung   Early death Father    Arthritis Mother    Depression Mother    Diabetes Mother    Heart disease Mother    Hypertension Mother    Hyperlipidemia Mother    Stroke Mother    Anxiety disorder Mother    Obesity Mother    Depression Sister    Diabetes Sister    Hyperlipidemia Sister    Hypertension Sister    Obesity Sister    Depression Sister    Hyperlipidemia Sister    Hypertension Sister    Miscarriages / Stillbirths Sister    Diabetes Sister    Obesity Sister    Depression Sister    Hyperlipidemia Sister    Hypertension Sister    Miscarriages / Stillbirths Sister  Depression Brother    Diabetes Brother    Hyperlipidemia Brother    Hypertension Brother    Stroke Brother    Heart disease Brother    Kidney disease Brother    Breast cancer Neg Hx    Ovarian cancer Neg Hx    Social History   Socioeconomic History   Marital status: Married    Spouse name: Not on file   Number of children: Not on file   Years of education: Not on file   Highest education level: 12th grade  Occupational History   Not on file  Tobacco Use   Smoking status: Never   Smokeless tobacco: Never  Vaping Use   Vaping status: Never Used  Substance and Sexual Activity   Alcohol use: No   Drug use: No   Sexual activity: Never  Other Topics Concern   Not on file  Social  History Narrative   Not on file   Social Drivers of Health   Tobacco Use: Low Risk (07/04/2024)   Patient History    Smoking Tobacco Use: Never    Smokeless Tobacco Use: Never    Passive Exposure: Not on file  Financial Resource Strain: Low Risk (12/16/2022)   Overall Financial Resource Strain (CARDIA)    Difficulty of Paying Living Expenses: Not very hard  Food Insecurity: No Food Insecurity (12/16/2022)   Hunger Vital Sign    Worried About Running Out of Food in the Last Year: Never true    Ran Out of Food in the Last Year: Never true  Transportation Needs: No Transportation Needs (12/16/2022)   PRAPARE - Administrator, Civil Service (Medical): No    Lack of Transportation (Non-Medical): No  Physical Activity: Insufficiently Active (12/16/2022)   Exercise Vital Sign    Days of Exercise per Week: 4 days    Minutes of Exercise per Session: 20 min  Stress: No Stress Concern Present (12/16/2022)   [employer] of Occupational Health - Occupational Stress Questionnaire    Feeling of Stress : Only a little  Social Connections: Moderately Integrated (12/16/2022)   Social Connection and Isolation Panel    Frequency of Communication with Friends and Family: Twice a week    Frequency of Social Gatherings with Friends and Family: Once a week    Attends Religious Services: More than 4 times per year    Active Member of [employer] or Organizations: No    Attends Banker Meetings: Not on file    Marital Status: Married  Depression (PHQ2-9): Low Risk (05/25/2024)   Depression (PHQ2-9)    PHQ-2 Score: 2  Alcohol Screen: Not on file  Housing: Low Risk (12/16/2022)   Housing    Last Housing Risk Score: 0  Utilities: Not on file  Health Literacy: Not on file     Review of Systems  Constitutional:        Appetite better. Feeling better.   HENT:  Negative for congestion and sinus pressure.   Respiratory:  Negative for cough, chest tightness and shortness of breath.    Cardiovascular:  Negative for chest pain, palpitations and leg swelling.  Gastrointestinal:  Negative for abdominal pain, diarrhea, nausea and vomiting.  Genitourinary:  Negative for difficulty urinating and dysuria.  Musculoskeletal:  Negative for joint swelling and myalgias.  Skin:  Negative for color change and rash.  Neurological:  Negative for dizziness and headaches.  Psychiatric/Behavioral:  Negative for agitation and dysphoric mood.        Objective:  BP 118/70   Pulse 64   Temp 98.3 F (36.8 C) (Oral)   Ht 5' 4 (1.626 m)   Wt 169 lb 3.2 oz (76.7 kg)   SpO2 97%   BMI 29.04 kg/m  Wt Readings from Last 3 Encounters:  06/30/24 169 lb 3.2 oz (76.7 kg)  05/25/24 174 lb 9.6 oz (79.2 kg)  02/24/24 184 lb (83.5 kg)    Physical Exam Vitals reviewed.  Constitutional:      General: She is not in acute distress.    Appearance: Normal appearance.  HENT:     Head: Normocephalic and atraumatic.     Right Ear: External ear normal.     Left Ear: External ear normal.     Mouth/Throat:     Pharynx: No oropharyngeal exudate or posterior oropharyngeal erythema.  Eyes:     General: No scleral icterus.       Right eye: No discharge.        Left eye: No discharge.     Conjunctiva/sclera: Conjunctivae normal.  Neck:     Thyroid : No thyromegaly.  Cardiovascular:     Rate and Rhythm: Normal rate and regular rhythm.  Pulmonary:     Effort: No respiratory distress.     Breath sounds: Normal breath sounds. No wheezing.  Abdominal:     General: Bowel sounds are normal.     Palpations: Abdomen is soft.     Tenderness: There is no abdominal tenderness.  Musculoskeletal:        General: No swelling or tenderness.     Cervical back: Neck supple. No tenderness.  Lymphadenopathy:     Cervical: No cervical adenopathy.  Skin:    Findings: No erythema or rash.  Neurological:     Mental Status: She is alert.  Psychiatric:        Mood and Affect: Mood normal.        Behavior:  Behavior normal.         Outpatient Encounter Medications as of 06/30/2024  Medication Sig   aspirin 81 MG tablet Take 81 mg by mouth daily.    cyanocobalamin  (VITAMIN B12) 1000 MCG tablet Take 1,000 mcg by mouth daily.   fluocinonide  cream (LIDEX ) 0.05 % Use as directed   hydrochlorothiazide  (HYDRODIURIL ) 25 MG tablet Take 1 tablet (25 mg total) by mouth daily.   lisinopril  (ZESTRIL ) 10 MG tablet Take 1 tablet (10 mg total) by mouth daily.   pantoprazole  (PROTONIX ) 40 MG tablet TAKE 1 TABLET(40 MG) BY MOUTH DAILY   rosuvastatin  (CRESTOR ) 20 MG tablet Take 1 tablet (20 mg total) by mouth daily.   [DISCONTINUED] hydrochlorothiazide  (HYDRODIURIL ) 25 MG tablet TAKE 1 TABLET(25 MG) BY MOUTH DAILY   [DISCONTINUED] lisinopril  (ZESTRIL ) 10 MG tablet Take 1 tablet (10 mg total) by mouth daily.   [DISCONTINUED] pantoprazole  (PROTONIX ) 40 MG tablet TAKE 1 TABLET(40 MG) BY MOUTH DAILY   [DISCONTINUED] rosuvastatin  (CRESTOR ) 20 MG tablet TAKE 1 TABLET(20 MG) BY MOUTH DAILY   No facility-administered encounter medications on file as of 06/30/2024.     Lab Results  Component Value Date   WBC 5.4 05/25/2024   HGB 14.0 05/25/2024   HCT 40.2 05/25/2024   PLT 164.0 05/25/2024   GLUCOSE 91 05/25/2024   CHOL 130 05/25/2024   TRIG 75.0 05/25/2024   HDL 51.30 05/25/2024   LDLCALC 64 05/25/2024   ALT 14 05/25/2024   AST 16 05/25/2024   NA 142 05/25/2024   K 3.8 05/25/2024   CL 102 05/25/2024  CREATININE 0.68 05/25/2024   BUN 17 05/25/2024   CO2 31 05/25/2024   TSH 2.92 05/25/2024   HGBA1C 5.6 05/25/2024       Assessment & Plan:  Atypical ductal hyperplasia of breast Assessment & Plan:  Had f/u with [facility] surgical oncology 09/08/23 - f/u right breast atypical ductal hyperplasia. Stable. Recommended f/u in one year.   Hypercholesterolemia Assessment & Plan: On crestor . Follow lipid panel and liver function tests.   Orders: -     Lipid panel; Future -     Hepatic function panel;  Future  Hyperglycemia Assessment & Plan: Follow met b and A1c.   Orders: -     Hemoglobin A1c; Future  Primary hypertension Assessment & Plan: Blood pressure as outlined. Continue hydrochlorothiazide  and lisinopril . Follow pressures. Follow metabolic panel.   Orders: -     Basic metabolic panel with GFR; Future  Leukopenia, unspecified type Assessment & Plan: Follow cbc.   Gastroesophageal reflux disease, unspecified whether esophagitis present Assessment & Plan: On protonix . Feeling better. Appetite better.    History of colon polyps Assessment & Plan: Colonoscopy 12/2021 - one 5mm polyp in the cecum- tubular adenoma   Other orders -     hydroCHLOROthiazide ; Take 1 tablet (25 mg total) by mouth daily.  Dispense: 90 tablet; Refill: 1 -     Lisinopril ; Take 1 tablet (10 mg total) by mouth daily.  Dispense: 90 tablet; Refill: 1 -     Pantoprazole  Sodium; TAKE 1 TABLET(40 MG) BY MOUTH DAILY  Dispense: 90 tablet; Refill: 1 -     Rosuvastatin  Calcium ; Take 1 tablet (20 mg total) by mouth daily.  Dispense: 90 tablet; Refill: 1     Allena Hamilton, MD "

## 2024-06-30 NOTE — Telephone Encounter (Signed)
 Spoke to pharmacy. No records of shingles vacc.

## 2024-06-30 NOTE — Telephone Encounter (Signed)
 Please notify pt

## 2024-06-30 NOTE — Telephone Encounter (Signed)
 Pt notified. Pt stated she was going to go through her records and see if she can find it. Informed pt to send to mychart for review

## 2024-07-04 ENCOUNTER — Encounter: Payer: Self-pay | Admitting: Internal Medicine

## 2024-07-04 NOTE — Assessment & Plan Note (Signed)
Colonoscopy 12/2021 - one 5mm polyp in the cecum- tubular adenoma 

## 2024-07-04 NOTE — Assessment & Plan Note (Signed)
 Follow cbc.

## 2024-07-04 NOTE — Assessment & Plan Note (Signed)
 Follow met b and A1c.

## 2024-07-04 NOTE — Assessment & Plan Note (Signed)
 Blood pressure as outlined. Continue hydrochlorothiazide  and lisinopril . Follow pressures. Follow metabolic panel.

## 2024-07-04 NOTE — Assessment & Plan Note (Signed)
 On protonix . Feeling better. Appetite better.

## 2024-07-04 NOTE — Assessment & Plan Note (Signed)
 On crestor.  Follow lipid panel and liver function tests.

## 2024-07-04 NOTE — Assessment & Plan Note (Signed)
"   Had f/u with [facility] surgical oncology 09/08/23 - f/u right breast atypical ductal hyperplasia. Stable. Recommended f/u in one year. "

## 2024-09-07 ENCOUNTER — Other Ambulatory Visit

## 2024-09-09 ENCOUNTER — Ambulatory Visit: Admitting: Internal Medicine
